# Patient Record
Sex: Female | Born: 1950
Health system: Southern US, Community
[De-identification: ages and names within clinical notes are randomized; demographics above are authoritative.]

## PROBLEM LIST (undated history)

## (undated) DIAGNOSIS — M1990 Unspecified osteoarthritis, unspecified site: Secondary | ICD-10-CM

## (undated) DIAGNOSIS — F329 Major depressive disorder, single episode, unspecified: Secondary | ICD-10-CM

## (undated) DIAGNOSIS — E785 Hyperlipidemia, unspecified: Secondary | ICD-10-CM

## (undated) DIAGNOSIS — F32A Anxiety disorder, unspecified: Secondary | ICD-10-CM

## (undated) DIAGNOSIS — I10 Essential (primary) hypertension: Secondary | ICD-10-CM

## (undated) DIAGNOSIS — F419 Anxiety disorder, unspecified: Secondary | ICD-10-CM

## (undated) DIAGNOSIS — K589 Irritable bowel syndrome without diarrhea: Secondary | ICD-10-CM

## (undated) DIAGNOSIS — R0789 Other chest pain: Secondary | ICD-10-CM

## (undated) DIAGNOSIS — Z86718 Personal history of other venous thrombosis and embolism: Secondary | ICD-10-CM

## (undated) HISTORY — PX: JOINT REPLACEMENT: SHX530

## (undated) HISTORY — PX: SHOULDER SURGERY: SHX246

## (undated) HISTORY — DX: Other chest pain: R07.89

## (undated) HISTORY — DX: Unspecified osteoarthritis, unspecified site: M19.90

## (undated) HISTORY — PX: CHOLECYSTECTOMY: SHX55

## (undated) HISTORY — DX: Essential (primary) hypertension: I10

## (undated) HISTORY — DX: Hyperlipidemia, unspecified: E78.5

## (undated) HISTORY — DX: Anxiety disorder, unspecified: F41.9

## (undated) HISTORY — DX: Major depressive disorder, single episode, unspecified: F32.9

## (undated) HISTORY — DX: Irritable bowel syndrome, unspecified: K58.9

## (undated) HISTORY — PX: ABDOMINAL HYSTERECTOMY: SHX81

## (undated) HISTORY — DX: Depression, unspecified: F32.A

## (undated) HISTORY — DX: Personal history of other venous thrombosis and embolism: Z86.718

---

## 1987-04-11 DIAGNOSIS — Z86718 Personal history of other venous thrombosis and embolism: Secondary | ICD-10-CM

## 1987-04-11 HISTORY — DX: Personal history of other venous thrombosis and embolism: Z86.718

## 2000-08-21 ENCOUNTER — Emergency Department (HOSPITAL_COMMUNITY): Admission: EM | Admit: 2000-08-21 | Discharge: 2000-08-22 | Payer: Self-pay | Admitting: Emergency Medicine

## 2000-09-20 ENCOUNTER — Ambulatory Visit (HOSPITAL_COMMUNITY): Admission: RE | Admit: 2000-09-20 | Discharge: 2000-09-20 | Payer: Self-pay | Admitting: Internal Medicine

## 2000-09-20 ENCOUNTER — Encounter: Payer: Self-pay | Admitting: Internal Medicine

## 2001-04-10 HISTORY — PX: TOTAL KNEE ARTHROPLASTY: SHX125

## 2001-05-13 ENCOUNTER — Emergency Department (HOSPITAL_COMMUNITY): Admission: EM | Admit: 2001-05-13 | Discharge: 2001-05-13 | Payer: Self-pay | Admitting: Emergency Medicine

## 2001-06-24 ENCOUNTER — Encounter (HOSPITAL_COMMUNITY): Admission: RE | Admit: 2001-06-24 | Discharge: 2001-07-24 | Payer: Self-pay | Admitting: Orthopedic Surgery

## 2001-07-25 ENCOUNTER — Encounter (HOSPITAL_COMMUNITY): Admission: RE | Admit: 2001-07-25 | Discharge: 2001-08-24 | Payer: Self-pay | Admitting: Orthopedic Surgery

## 2001-10-07 ENCOUNTER — Encounter (HOSPITAL_COMMUNITY)
Admission: RE | Admit: 2001-10-07 | Discharge: 2001-11-06 | Payer: Self-pay | Admitting: Physical Medicine and Rehabilitation

## 2001-10-25 ENCOUNTER — Ambulatory Visit (HOSPITAL_COMMUNITY): Admission: RE | Admit: 2001-10-25 | Discharge: 2001-10-25 | Payer: Self-pay | Admitting: Pulmonary Disease

## 2001-10-25 ENCOUNTER — Encounter: Payer: Self-pay | Admitting: Physical Medicine and Rehabilitation

## 2002-01-07 ENCOUNTER — Encounter: Payer: Self-pay | Admitting: Orthopedic Surgery

## 2002-01-13 ENCOUNTER — Inpatient Hospital Stay (HOSPITAL_COMMUNITY): Admission: RE | Admit: 2002-01-13 | Discharge: 2002-01-17 | Payer: Self-pay | Admitting: Orthopedic Surgery

## 2002-02-17 ENCOUNTER — Encounter (HOSPITAL_COMMUNITY): Admission: RE | Admit: 2002-02-17 | Discharge: 2002-03-19 | Payer: Self-pay | Admitting: Orthopedic Surgery

## 2002-03-19 ENCOUNTER — Encounter (HOSPITAL_COMMUNITY): Admission: RE | Admit: 2002-03-19 | Discharge: 2002-04-18 | Payer: Self-pay | Admitting: Orthopedic Surgery

## 2002-04-24 ENCOUNTER — Encounter (HOSPITAL_COMMUNITY): Admission: RE | Admit: 2002-04-24 | Discharge: 2002-05-24 | Payer: Self-pay | Admitting: Orthopedic Surgery

## 2002-06-02 ENCOUNTER — Encounter (HOSPITAL_COMMUNITY): Admission: RE | Admit: 2002-06-02 | Discharge: 2002-07-02 | Payer: Self-pay | Admitting: Orthopedic Surgery

## 2002-06-03 ENCOUNTER — Ambulatory Visit (HOSPITAL_COMMUNITY): Admission: RE | Admit: 2002-06-03 | Discharge: 2002-06-03 | Payer: Self-pay | Admitting: Pulmonary Disease

## 2003-01-26 ENCOUNTER — Encounter (HOSPITAL_COMMUNITY): Admission: RE | Admit: 2003-01-26 | Discharge: 2003-02-25 | Payer: Self-pay | Admitting: Orthopedic Surgery

## 2003-03-13 ENCOUNTER — Encounter (HOSPITAL_COMMUNITY): Admission: RE | Admit: 2003-03-13 | Discharge: 2003-04-12 | Payer: Self-pay | Admitting: Orthopedic Surgery

## 2003-03-31 ENCOUNTER — Ambulatory Visit (HOSPITAL_COMMUNITY): Admission: RE | Admit: 2003-03-31 | Discharge: 2003-03-31 | Payer: Self-pay | Admitting: Orthopedic Surgery

## 2003-04-11 HISTORY — PX: COLONOSCOPY: SHX174

## 2003-08-21 ENCOUNTER — Ambulatory Visit (HOSPITAL_COMMUNITY): Admission: RE | Admit: 2003-08-21 | Discharge: 2003-08-21 | Payer: Self-pay | Admitting: Internal Medicine

## 2003-11-04 ENCOUNTER — Ambulatory Visit (HOSPITAL_BASED_OUTPATIENT_CLINIC_OR_DEPARTMENT_OTHER): Admission: RE | Admit: 2003-11-04 | Discharge: 2003-11-04 | Payer: Self-pay | Admitting: Orthopedic Surgery

## 2003-11-04 ENCOUNTER — Ambulatory Visit (HOSPITAL_COMMUNITY): Admission: RE | Admit: 2003-11-04 | Discharge: 2003-11-04 | Payer: Self-pay | Admitting: Orthopedic Surgery

## 2003-11-18 ENCOUNTER — Encounter (HOSPITAL_COMMUNITY): Admission: RE | Admit: 2003-11-18 | Discharge: 2003-12-18 | Payer: Self-pay | Admitting: Orthopedic Surgery

## 2003-12-21 ENCOUNTER — Encounter (HOSPITAL_COMMUNITY): Admission: RE | Admit: 2003-12-21 | Discharge: 2004-01-08 | Payer: Self-pay | Admitting: Orthopedic Surgery

## 2004-01-13 ENCOUNTER — Encounter (HOSPITAL_COMMUNITY): Admission: RE | Admit: 2004-01-13 | Discharge: 2004-02-12 | Payer: Self-pay | Admitting: Orthopedic Surgery

## 2004-06-06 ENCOUNTER — Ambulatory Visit (HOSPITAL_COMMUNITY): Admission: RE | Admit: 2004-06-06 | Discharge: 2004-06-06 | Payer: Self-pay

## 2004-06-13 ENCOUNTER — Emergency Department (HOSPITAL_COMMUNITY): Admission: EM | Admit: 2004-06-13 | Discharge: 2004-06-13 | Payer: Self-pay | Admitting: Emergency Medicine

## 2005-04-10 HISTORY — PX: TOTAL KNEE ARTHROPLASTY: SHX125

## 2005-06-12 ENCOUNTER — Ambulatory Visit (HOSPITAL_COMMUNITY): Admission: RE | Admit: 2005-06-12 | Discharge: 2005-06-12 | Payer: Self-pay | Admitting: Pulmonary Disease

## 2005-08-16 ENCOUNTER — Ambulatory Visit: Payer: Self-pay | Admitting: Internal Medicine

## 2005-11-20 ENCOUNTER — Inpatient Hospital Stay (HOSPITAL_COMMUNITY): Admission: RE | Admit: 2005-11-20 | Discharge: 2005-11-23 | Payer: Self-pay | Admitting: Orthopedic Surgery

## 2005-11-22 ENCOUNTER — Encounter (INDEPENDENT_AMBULATORY_CARE_PROVIDER_SITE_OTHER): Payer: Self-pay | Admitting: *Deleted

## 2005-12-18 ENCOUNTER — Encounter (HOSPITAL_COMMUNITY): Admission: RE | Admit: 2005-12-18 | Discharge: 2006-01-05 | Payer: Self-pay | Admitting: Orthopedic Surgery

## 2006-01-08 ENCOUNTER — Encounter (HOSPITAL_COMMUNITY): Admission: RE | Admit: 2006-01-08 | Discharge: 2006-02-07 | Payer: Self-pay | Admitting: Orthopedic Surgery

## 2006-02-13 ENCOUNTER — Encounter (HOSPITAL_COMMUNITY): Admission: RE | Admit: 2006-02-13 | Discharge: 2006-03-15 | Payer: Self-pay | Admitting: Orthopedic Surgery

## 2006-03-16 ENCOUNTER — Encounter (HOSPITAL_COMMUNITY): Admission: RE | Admit: 2006-03-16 | Discharge: 2006-04-09 | Payer: Self-pay | Admitting: Orthopedic Surgery

## 2006-05-13 ENCOUNTER — Emergency Department (HOSPITAL_COMMUNITY): Admission: EM | Admit: 2006-05-13 | Discharge: 2006-05-13 | Payer: Self-pay | Admitting: Emergency Medicine

## 2006-06-22 ENCOUNTER — Ambulatory Visit (HOSPITAL_COMMUNITY): Admission: RE | Admit: 2006-06-22 | Discharge: 2006-06-22 | Payer: Self-pay | Admitting: Pulmonary Disease

## 2006-07-30 ENCOUNTER — Ambulatory Visit (HOSPITAL_COMMUNITY): Admission: RE | Admit: 2006-07-30 | Discharge: 2006-07-30 | Payer: Self-pay | Admitting: Pulmonary Disease

## 2007-06-03 ENCOUNTER — Ambulatory Visit (HOSPITAL_COMMUNITY): Admission: RE | Admit: 2007-06-03 | Discharge: 2007-06-03 | Payer: Self-pay | Admitting: Pulmonary Disease

## 2007-06-21 ENCOUNTER — Ambulatory Visit (HOSPITAL_COMMUNITY): Admission: RE | Admit: 2007-06-21 | Discharge: 2007-06-21 | Payer: Self-pay | Admitting: Pulmonary Disease

## 2007-07-04 ENCOUNTER — Encounter: Payer: Self-pay | Admitting: Orthopedic Surgery

## 2007-12-03 ENCOUNTER — Ambulatory Visit (HOSPITAL_COMMUNITY): Admission: RE | Admit: 2007-12-03 | Discharge: 2007-12-03 | Payer: Self-pay | Admitting: Pulmonary Disease

## 2009-01-22 ENCOUNTER — Emergency Department (HOSPITAL_COMMUNITY): Admission: EM | Admit: 2009-01-22 | Discharge: 2009-01-22 | Payer: Self-pay | Admitting: Emergency Medicine

## 2009-07-25 ENCOUNTER — Emergency Department (HOSPITAL_COMMUNITY): Admission: EM | Admit: 2009-07-25 | Discharge: 2009-07-25 | Payer: Self-pay | Admitting: Emergency Medicine

## 2009-09-13 ENCOUNTER — Encounter (INDEPENDENT_AMBULATORY_CARE_PROVIDER_SITE_OTHER): Payer: Self-pay | Admitting: *Deleted

## 2009-09-13 LAB — CONVERTED CEMR LAB
Alkaline Phosphatase: 100 units/L
Bilirubin, Direct: 0.1 mg/dL
CO2: 28 meq/L
Cholesterol: 126 mg/dL
Creatinine, Ser: 0.53 mg/dL
Glucose, Bld: 102 mg/dL
Hgb A1c MFr Bld: 7 %
LDL Cholesterol: 58 mg/dL
Triglycerides: 67 mg/dL

## 2009-10-01 ENCOUNTER — Encounter (INDEPENDENT_AMBULATORY_CARE_PROVIDER_SITE_OTHER): Payer: Self-pay | Admitting: *Deleted

## 2009-10-05 ENCOUNTER — Ambulatory Visit: Payer: Self-pay | Admitting: Cardiology

## 2009-10-05 ENCOUNTER — Encounter (INDEPENDENT_AMBULATORY_CARE_PROVIDER_SITE_OTHER): Payer: Self-pay | Admitting: *Deleted

## 2009-10-05 DIAGNOSIS — K589 Irritable bowel syndrome without diarrhea: Secondary | ICD-10-CM | POA: Insufficient documentation

## 2009-10-05 DIAGNOSIS — E782 Mixed hyperlipidemia: Secondary | ICD-10-CM

## 2009-10-05 DIAGNOSIS — I82409 Acute embolism and thrombosis of unspecified deep veins of unspecified lower extremity: Secondary | ICD-10-CM | POA: Insufficient documentation

## 2009-10-05 DIAGNOSIS — M1990 Unspecified osteoarthritis, unspecified site: Secondary | ICD-10-CM | POA: Insufficient documentation

## 2009-10-05 DIAGNOSIS — F329 Major depressive disorder, single episode, unspecified: Secondary | ICD-10-CM

## 2009-10-05 DIAGNOSIS — E119 Type 2 diabetes mellitus without complications: Secondary | ICD-10-CM | POA: Insufficient documentation

## 2009-10-05 DIAGNOSIS — F411 Generalized anxiety disorder: Secondary | ICD-10-CM | POA: Insufficient documentation

## 2009-10-05 DIAGNOSIS — F339 Major depressive disorder, recurrent, unspecified: Secondary | ICD-10-CM | POA: Insufficient documentation

## 2009-10-05 DIAGNOSIS — R609 Edema, unspecified: Secondary | ICD-10-CM | POA: Insufficient documentation

## 2009-10-05 DIAGNOSIS — R0789 Other chest pain: Secondary | ICD-10-CM

## 2009-10-05 HISTORY — DX: Acute embolism and thrombosis of unspecified deep veins of unspecified lower extremity: I82.409

## 2009-10-06 ENCOUNTER — Encounter: Payer: Self-pay | Admitting: Cardiology

## 2009-10-29 ENCOUNTER — Encounter: Payer: Self-pay | Admitting: Internal Medicine

## 2009-11-26 ENCOUNTER — Encounter: Payer: Self-pay | Admitting: Cardiology

## 2009-11-26 ENCOUNTER — Ambulatory Visit: Payer: Self-pay | Admitting: Cardiology

## 2009-11-26 ENCOUNTER — Ambulatory Visit (HOSPITAL_COMMUNITY): Admission: RE | Admit: 2009-11-26 | Discharge: 2009-11-26 | Payer: Self-pay | Admitting: Cardiology

## 2009-12-03 ENCOUNTER — Ambulatory Visit (HOSPITAL_COMMUNITY): Admission: RE | Admit: 2009-12-03 | Discharge: 2009-12-03 | Payer: Self-pay | Admitting: Adult Health

## 2009-12-03 ENCOUNTER — Encounter (INDEPENDENT_AMBULATORY_CARE_PROVIDER_SITE_OTHER): Payer: Self-pay | Admitting: *Deleted

## 2009-12-03 ENCOUNTER — Ambulatory Visit: Payer: Self-pay | Admitting: Cardiology

## 2009-12-03 ENCOUNTER — Encounter: Payer: Self-pay | Admitting: Adult Health

## 2009-12-03 DIAGNOSIS — R9439 Abnormal result of other cardiovascular function study: Secondary | ICD-10-CM | POA: Insufficient documentation

## 2009-12-03 LAB — CONVERTED CEMR LAB
BUN: 14 mg/dL (ref 6–23)
CO2: 26 meq/L (ref 19–32)
Chloride: 104 meq/L (ref 96–112)
Eosinophils Absolute: 0.1 10*3/uL (ref 0.0–0.7)
Eosinophils Relative: 1 % (ref 0–5)
Glucose, Bld: 164 mg/dL — ABNORMAL HIGH (ref 70–99)
HCT: 33.9 % — ABNORMAL LOW (ref 36.0–46.0)
INR: 0.87 (ref <1.50)
Lymphs Abs: 2.5 10*3/uL (ref 0.7–4.0)
MCV: 89.8 fL (ref 78.0–100.0)
Monocytes Absolute: 0.3 10*3/uL (ref 0.1–1.0)
Monocytes Relative: 5 % (ref 3–12)
Potassium: 3.9 meq/L (ref 3.5–5.3)
RBC: 3.77 M/uL — ABNORMAL LOW (ref 3.87–5.11)
WBC: 7.3 10*3/uL (ref 4.0–10.5)

## 2009-12-06 ENCOUNTER — Ambulatory Visit: Payer: Self-pay | Admitting: Internal Medicine

## 2009-12-06 ENCOUNTER — Inpatient Hospital Stay (HOSPITAL_BASED_OUTPATIENT_CLINIC_OR_DEPARTMENT_OTHER): Admission: RE | Admit: 2009-12-06 | Discharge: 2009-12-06 | Payer: Self-pay | Admitting: Internal Medicine

## 2009-12-10 ENCOUNTER — Encounter (INDEPENDENT_AMBULATORY_CARE_PROVIDER_SITE_OTHER): Payer: Self-pay | Admitting: *Deleted

## 2009-12-17 ENCOUNTER — Encounter (INDEPENDENT_AMBULATORY_CARE_PROVIDER_SITE_OTHER): Payer: Self-pay | Admitting: *Deleted

## 2009-12-30 ENCOUNTER — Encounter (INDEPENDENT_AMBULATORY_CARE_PROVIDER_SITE_OTHER): Payer: Self-pay | Admitting: *Deleted

## 2010-01-24 ENCOUNTER — Emergency Department (HOSPITAL_COMMUNITY): Admission: EM | Admit: 2010-01-24 | Discharge: 2010-01-24 | Payer: Self-pay | Admitting: Emergency Medicine

## 2010-04-30 ENCOUNTER — Encounter: Payer: Self-pay | Admitting: Pulmonary Disease

## 2010-05-01 ENCOUNTER — Encounter: Payer: Self-pay | Admitting: Pulmonary Disease

## 2010-05-12 NOTE — Letter (Signed)
Summary: Stress Echocardiogram Information Sheet  [facility] at [facility]  [address], [place] [address]   Phone: [phone]  Fax: [phone]      October 05, 2009 MRN: [id number] light prior to the test.   Donalynn Furlong  Doctor: Appointment Date: Appointment Time: Appointment Location: [facility]  Stress Echocardiogram Information Sheet    Instructions:   1. DO NOT  take your AM  medicine the morning before the test.  2. Nothing to eat or drink prior to the tests  3. Dress prepared to exercise.  4. DO NOT use ANY caffine or tobacco products 3 hours before appointment.  5. Report to the Short Stay Center on the1st floor.  6. Please bring all current prescription medications.  7. If you have any questions, please call [phone]

## 2010-05-12 NOTE — Letter (Signed)
Summary: [place] [facility], [facility] at [facility]  [address], [place] [address]   Phone: [phone]  Fax: [phone]      December 10, 2009 MRN: [id number]   [name] [address] [place], [place]  [address]   Dear [name],  Your test ordered by Selena Batten has been reviewed by your physician (or physician assistant) and was found to be normal or stable. Your physician (or physician assistant) felt no changes were needed at this time.  __x__ Echocardiogram  ____ Cardiac Stress Test  __x__ Lab Work  ____ Peripheral vascular study of arms, legs or neck  __x__ CT scan or X-ray  ____ Lung or Breathing test  ____ Other:  No change in medical treatment at this time, per Dr. Dietrich Pates.  Please continue all current medicaitons as prescribed.  Thank you, Tammy Allyne Gee RN    [place] Bing, MD, Lenise Arena.C.Gaylord Shih, MD, F.A.C.C Lewayne Bunting, MD, F.A.C.C Nona Dell, MD, F.A.C.C Charlton Haws, MD, Lenise Arena.C.C

## 2010-05-12 NOTE — Letter (Signed)
Summary: Cardiac Catheterization Instructions- [facility]  [facility] [place]  [address] [place] [address]   Phone: [phone]  Fax: [phone]     12/03/2009 MRN: [id number]  Melissa Tucker [address] [place], [place]  [address]  Dear Melissa Tucker,   You are scheduled for a Cardiac Catheterization on 12/06/2009 with Dr.Bensimhon  Please arrive to the 1st floor of the Heart and Vascular Center at [facility] at 6:30 am  on the day of your procedure. Please do not arrive before 6:30 a.m. Call the Heart and Vascular Center at [phone] if you are unable to make your appointmnet. The Code to get into the parking garage under the building is 0020. Take the elevators to the 1st floor. You must have someone to drive you home. Someone must be with you for the first 24 hours after you arrive home. Please wear clothes that are easy to get on and off and wear slip-on shoes. Do not eat or drink after midnight except water with your medications that morning. Bring all your medications and current insurance cards with you.  _x__ DO NOT take these medications before your procedure: _glucophage/metformin the evening before the cath and 48 hrs after the cath, do not take furosemide the morning of cath  __x_ Make sure you take your aspirin.  ___ You may take ALL of your medications with water that morning. ________________________________________________________________________________________________________________________________  ___ DO NOT take ANY medications before your procedure.  ___ Pre-med instructions:  ________________________________________________________________________________________________________________________________  The usual length of stay after your procedure is 2 to 3 hours. This can vary.  If you have any questions, please call the office at the number listed above.   Teressa Lower RN

## 2010-05-12 NOTE — Assessment & Plan Note (Signed)
Summary: **PER MelissaHAWKIINS FOR CHEST TIGHTNESS/TG   Visit Type:  Initial Consult Primary Provider:  Dr. Kari Tucker   History of Present Illness: [name] is seen for an initial visit at the kind request of Melissa Tucker for evaluation of chest discomfort with a history of multiple cardiovascular risk factors.  This nice woman has not previously been seen by a cardiologist nor has she undergone any significant cardiac testing.    Prior hospital records were obtained and reviewed.  During an admission for total knee arthroplasty in 2007, her orthopaedic surgeon referred to chest discomfort that had been present for a number of years.  Accordingly, this is a long-standing complaint.  She developed chest pressure a few weeks ago in the setting of increased anxiety.  This was of mild to moderate severity and not associated with activity.  There was no radiation.  There were no associated symptoms.  She does have chronic class II dyspnea on exertion.  Chest discomfort is now resolved, and patient indicates that she is more concerned about her pedal edema.  Current Medications (verified): 1)  Glyburide-Metformin 5-500 Mg Tabs (Glyburide-Metformin) .... Take 2 Tabs Two Times A Day 2)  Lisinopril 40 Mg Tabs (Lisinopril) .... Take One Tablet By Mouth Daily 3)  Sertraline Hcl 50 Mg Tabs (Sertraline Hcl) .... Take 1 Tab Daily 4)  Simvastatin 40 Mg Tabs (Simvastatin) .... Take 1 Tab Daily 5)  Dicyclomine Hcl 10 Mg Caps (Dicyclomine Hcl) .... Take 1 Tab Daily 6)  Aspirin 325 Mg Tabs (Aspirin) .... Take 1 Tab Daily 7)  Furosemide 40 Mg Tabs (Furosemide) .... Take 1 Tab Daily 8)  Alprazolam 0.5 Mg Tabs (Alprazolam) .... Take 1 Tab Three Times A Day 9)  Hydrocodone-Acetaminophen 10-660 Mg Tabs (Hydrocodone-Acetaminophen) .... Take As Needed  Allergies (verified): No Known Drug Allergies  Past History:  Past Medical History: Last updated: Oct 12, 2009 Chest  tightness Hypertension Hyperlipidemia AODM-no insulin; 1994 onset Anxiety/depression Degenerative joint disease-status post bilateral TKA Irritable bowel syndrome History of deep vein thrombosis-1989 following childbirth  Past Surgical History: Last updated: October 12, 2009 Colonoscopy-2005; normal findings Left shoulder manipulation with subacrominal decompression -capsulitis Left TKA-2003; Right TKA-2007 Hysterectomy Cholecystectomy C-Section-1988  Family History: Last updated: October 12, 2009 Father:deceased 80 yrs esophageal cancer Mother:alive at age 59 with heart disease history of cervical cancer and hypertension. Siblings:5 brothers 2 sisters; one sister with hypertension   Social History: Last updated: Oct 12, 2009 Employment-disabled Marital-divorced; resides with her daughter; also has 2 sons Tobacco Use - minimal prior use; discontinued 1990  Alcohol Use - no Regular Exercise - no Drug Use - no patient lives with daughter  Review of Systems       Patient requires corrective lenses for near vision; occasional mild palpitations; intermittent diarrhea and constipation; urinary frequency; diffuse arthritis; recently noted lower extremity edema, more prominent on the right.  She reports a history of sinusitis.  Vital Signs:  Patient profile:   60 year old female Height:      69 inches Weight:      239 pounds BMI:     35.42 Pulse rate:   87 / minute BP sitting:   145 / 75  (right arm)  Vitals Entered By: Dreama Saa, CNA (October 12, 2009 1:07 PM)  Physical Exam  General:  Overweight; well-developed; no acute distress: HEENT-[place]/AT; PERRL; EOM intact; conjunctiva and lids nl:  Neck-No JVD; no carotid bruits: Endocrine-No thyromegaly: Lungs-No tachypnea, clear without rales, rhonchi or wheezes: CV-normal PMI; normal S1 and S2:;  Abdomen-BS normal;  soft and non-tender without masses or organomegaly: MS-No deformities, cyanosis or clubbing; surgical scars over both  knees Neurologic-Nl cranial nerves; symmetric strength and tone: Skin- Warm, no sig. lesions: Extremities-Nl distal pulses; no edema     Impression & Recommendations:  Problem # 1:  CHEST DISCOMFORT (ICD-786.59) Chest discomfort is very atypical, has resolved spontaneously and has been present for a number of years.  I doubt that this represents myocardial ischemia, but due to her numerous cardiovascular risk factors, stress testing is warranted.  A stress echocardiogram will be performed.  Problem # 2:  HYPERLIPIDEMIA (ICD-272.4)  Recent lipid profile was excellent; current medication will be continued.  Problem # 3:  HYPERTENSION (ICD-401.1) Blood pressure control somewhat suboptimal.  Dose of lisinopril will be increased to 40 mg q.d.  Problem # 4:  EDEMA (ICD-782.3) Edema is relatively modest, but is troubling the patient.  Actos may be contributing and will be discontinued.  If diabetic control becomes compromised, an alternative agent such as Januvia can be selected.  If edema does not resolve, further investigation and treatment can be considered.  I doubt this is a manifestation of any cardiac problems.  I will reassess this nice woman in one month after stress testing has been completed.  Other Orders: Stress Echo (Stress Echo)  EKG  Procedure date:  10/05/2009  Findings:      Normal sinus rhythm Left axis deviation LVH with QRS widening Delayed R wave progression-cannot exclude prior anteroseptal MI Nonspecific ST-T wave abnormality IVCD Since previous tracing of 09/13/09, axis has shifted leftward.   Patient Instructions: 1)  Your physician recommends that you schedule a follow-up appointment in: 1 month 2)  Your physician has recommended you make the following change in your medication: stop actos, increase lisinopril to 40mg  daily 3)  Your physician has requested that you have a stress echocardiogram. For further information please visit [email or web address]   Please follow instruction sheet as given. Prescriptions: LISINOPRIL 40 MG TABS (LISINOPRIL) Take one tablet by mouth daily  #30 x 6   Entered by:   Teressa Lower RN   Authorized by:   Kathlen Brunswick, MD, [facility]   Signed by:   Teressa Lower RN on 10/05/2009   Method used:   Electronically to        [facility] (retail)       [address]       [place], [place]  [address]       Ph: [phone] or [phone]       Fax: [phone]   RxID:   [id number]

## 2010-05-12 NOTE — Assessment & Plan Note (Signed)
Summary: ROV   Visit Type:  Follow-up Primary Provider:  Dr. Kari Baars  CC:  no cardiology complaints.  History of Present Illness: Melissa Tucker is a pleasant  60 y/o AAF we are seeing on follow-up after having stress echocardiogram in the setting of multiple CVRF of hypertension, hyperlipidemia, Diabetes, and chest pain.  She was seen by Dr Dietrich Pates on 10/05/2009. She has had occaisional chest discomfort since last visit and complains of stress in her personal life.  Current Medications (verified): 1)  Glyburide-Metformin 5-500 Mg Tabs (Glyburide-Metformin) .... Take 2 Tabs Two Times A Day 2)  Lisinopril 40 Mg Tabs (Lisinopril) .... Take One Tablet By Mouth Daily 3)  Sertraline Hcl 50 Mg Tabs (Sertraline Hcl) .... Take 1 Tab Daily 4)  Simvastatin 40 Mg Tabs (Simvastatin) .... Take 1 Tab Daily 5)  Dicyclomine Hcl 10 Mg Caps (Dicyclomine Hcl) .... Take 1 Tab Daily 6)  Aspirin 325 Mg Tabs (Aspirin) .... Take 1 Tab Daily 7)  Furosemide 40 Mg Tabs (Furosemide) .... Take 1 Tab Daily 8)  Alprazolam 0.5 Mg Tabs (Alprazolam) .... Take 1 Tab Three Times A Day 9)  Hydrocodone-Acetaminophen 10-660 Mg Tabs (Hydrocodone-Acetaminophen) .... Take As Needed  Allergies (verified): No Known Drug Allergies  Past History:  Past medical, surgical, family and social histories (including risk factors) reviewed, and no changes noted (except as noted below).  Past Medical History: Reviewed history from 10/05/2009 and no changes required. Chest tightness Hypertension Hyperlipidemia AODM-no insulin; 1994 onset Anxiety/depression Degenerative joint disease-status post bilateral TKA Irritable bowel syndrome History of deep vein thrombosis-1989 following childbirth  Past Surgical History: Reviewed history from 10/05/2009 and no changes required. Colonoscopy-2005; normal findings Left shoulder manipulation with subacrominal decompression -capsulitis Left TKA-2003; Right  TKA-2007 Hysterectomy Cholecystectomy C-Section-1988  Family History: Reviewed history from 10/05/2009 and no changes required. Father:deceased 80 yrs esophageal cancer Mother:alive at age 45 with heart disease history of cervical cancer and hypertension. Siblings:5 brothers 2 sisters; one sister with hypertension   Social History: Reviewed history from 10/05/2009 and no changes required. Employment-disabled Marital-divorced; resides with her daughter; also has 2 sons Tobacco Use - minimal prior use; discontinued 1990  Alcohol Use - no Regular Exercise - no Drug Use - no patient lives with daughter  Review of Systems       All other systems have been reviewed and are negative unless stated above.   Vital Signs:  Patient profile:   60 year old female Weight:      241 pounds Pulse rate:   93 / minute BP sitting:   139 / 72  (right arm)  Vitals Entered By: Dreama Saa, CNA (December 03, 2009 1:25 PM)  Physical Exam  General:  Well developed, well nourished, in no acute distress. Head:  normocephalic and atraumatic Eyes:  PERRLA/EOM intact; conjunctiva and lids normal. Mouth:  Teeth, gums and palate normal. Oral mucosa normal. Lungs:  Clear bilaterally to auscultation and percussion. Heart:  Non-displaced PMI, chest non-tender; regular rate and rhythm, S1, S2 without murmurs, rubs or gallops. Carotid upstroke normal, no bruit. Normal abdominal aortic size, no bruits. Femorals normal pulses, no bruits. Pedals normal pulses. No edema, no varicosities. Abdomen:  Bowel sounds positive; abdomen soft and non-tender without masses, organomegaly, or hernias noted. No hepatosplenomegaly. Msk:  Back normal, normal gait. Muscle strength and tone normal. Pulses:  pulses normal in all 4 extremities Extremities:  No clubbing or cyanosis. Neurologic:  Alert and oriented x 3. Psych:  Normal affect.   Impression &  Recommendations:  Problem # 1:  ABNORMAL CV (STRESS) TEST  (ICD-794.39) Echo dated 12/01/2009 demonstrated hypokinesis of the antereoseptal and distal inferolateral LV myocardium.  Other segments showed slightly augumented to unchanged contractility.  LV size was normal and incrased from baseline.  LVEF was 60%.  We have therefore planned her for cardiac catherization early next week, December 06, 2009.  Risks, benefits, and written instructions are provided for the patient. She verbalized understanding is willing to proceed.  More recomemendations per cathing physician.  Problem # 2:  HYPERTENSION (ICD-401.1) Assessment: Unchanged  Her updated medication list for this problem includes:    Lisinopril 40 Mg Tabs (Lisinopril) .Marland Kitchen... Take one tablet by mouth daily    Aspirin 325 Mg Tabs (Aspirin) .Marland Kitchen... Take 1 tab daily    Furosemide 40 Mg Tabs (Furosemide) .Marland Kitchen... Take 1 tab daily  Orders: T-Basic Metabolic Panel [id number])  Problem # 3:  HYPERLIPIDEMIA (ICD-272.4) Assessment: Unchanged  Her updated medication list for this problem includes:    Simvastatin 40 Mg Tabs (Simvastatin) .Marland Kitchen... Take 1 tab daily  Other Orders: T-Chest x-ray, 2 views ([address]) T-CBC w/Diff ([id number]) T-Protime, Auto ([id number]) T-PTT ([id number]) Cardiac Catheterization (Cardiac Cath)  Patient Instructions: 1)  Your physician recommends that you schedule a follow-up appointment in: after procedure 2)  Your physician recommends that you return for lab work [id number] 3)  A chest x-ray takes a picture of the organs and structures inside the chest, including the heart, lungs, and blood vessels. This test can show several things, including, whether the heart is enlarged; whether fluid is building up in the lungs; and whether pacemaker / defibrillator leads are still in place. 4)  Your physician has requested that you have a cardiac catheterization.  Cardiac catheterization is used to diagnose and/or treat various heart conditions. Doctors may recommend this procedure for a  number of different reasons. The most common reason is to evaluate chest pain. Chest pain can be a symptom of coronary artery disease (CAD), and cardiac catheterization can show whether plaque is narrowing or blocking your heart's arteries. This procedure is also used to evaluate the valves, as well as measure the blood flow and oxygen levels in different parts of your heart.  For further information please visit [email or web address]  Please follow instruction sheet, as given.

## 2010-05-12 NOTE — Letter (Signed)
Summary: Internal Other/ REMINDER/TCS NOT DUE TIL 08/2013  Internal Other/ REMINDER/TCS NOT DUE TIL 08/2013   Imported By: Cloria Spring LPN 11/91/4782 95:62:13  _____________________________________________________________________  External Attachment:    Type:   Image     Comment:   External Document

## 2010-05-12 NOTE — Letter (Signed)
Summary: Appointment - Missed  [facility]     [place], [place]    Phone:   Fax:      December 30, 2009 MRN: [id number]   VENICIA VANDALL [address] [place], [place]  [address]   Dear Ms. Yetta Barre,  Our records indicate you missed your appointment on       12/30/09 Joni Reining NP                  It is very important that we reach you to reschedule this appointment. We look forward to participating in your health care needs. Please contact us at the number listed above at your earliest convenience to reschedule this appointment.     Sincerely,    Glass blower/designer

## 2010-05-12 NOTE — Letter (Signed)
Summary: Appointment - Missed  [facility]     [place], [place]    Phone:   Fax:      December 17, 2009 MRN: [id number]   Melissa Tucker [address] [place], [place]  [address]   Dear Melissa Tucker,  Our records indicate you missed your appointment on             12/17/09 Bailey Mech NP          It is very important that we reach you to reschedule this appointment. We look forward to participating in your health care needs. Please contact us at the number listed above at your earliest convenience to reschedule this appointment.     Sincerely,    Glass blower/designer

## 2010-05-12 NOTE — Miscellaneous (Signed)
Summary: LABS BMP,LIPIDS,LIVER,09/13/2009  Clinical Lists Changes  Observations: Added new observation of CALCIUM: 10.1 mg/dL (16/01/9603 54:09) Added new observation of ALBUMIN: 4.7 g/dL (81/19/1478 29:56) Added new observation of PROTEIN, TOT: 7.2 g/dL (21/30/8657 84:69) Added new observation of SGPT (ALT): 18 units/L (09/13/2009 10:45) Added new observation of SGOT (AST): 15 units/L (09/13/2009 10:45) Added new observation of ALK PHOS: 100 units/L (09/13/2009 10:45) Added new observation of BILI DIRECT: 0.1 mg/dL (62/95/2841 32:44) Added new observation of CREATININE: 0.53 mg/dL (04/12/7251 66:44) Added new observation of BUN: 17 mg/dL (03/47/4259 56:38) Added new observation of BG RANDOM: 102 mg/dL (75/64/3329 51:88) Added new observation of CO2 PLSM/SER: 28 meq/L (09/13/2009 10:45) Added new observation of CL SERUM: 103 meq/L (09/13/2009 10:45) Added new observation of K SERUM: 4.0 meq/L (09/13/2009 10:45) Added new observation of NA: 141 meq/L (09/13/2009 10:45) Added new observation of LDL: 58 mg/dL (41/66/0630 16:01) Added new observation of HDL: 55 mg/dL (09/32/3557 32:20) Added new observation of TRIGLYC TOT: 67 mg/dL (25/42/7062 37:62) Added new observation of CHOLESTEROL: 126 mg/dL (83/15/1761 60:73) Added new observation of HGBA1C: 7.0 % (09/13/2009 10:45)

## 2010-06-01 ENCOUNTER — Ambulatory Visit (HOSPITAL_COMMUNITY)
Admission: RE | Admit: 2010-06-01 | Discharge: 2010-06-01 | Disposition: A | Discharge status: Home / Self Care | Payer: Medicare Other | Source: Ambulatory Visit | Attending: Pulmonary Disease | Admitting: Pulmonary Disease

## 2010-06-01 ENCOUNTER — Other Ambulatory Visit (HOSPITAL_COMMUNITY): Payer: Self-pay | Admitting: Pulmonary Disease

## 2010-06-01 DIAGNOSIS — M545 Low back pain, unspecified: Secondary | ICD-10-CM | POA: Insufficient documentation

## 2010-06-01 DIAGNOSIS — M4120 Other idiopathic scoliosis, site unspecified: Secondary | ICD-10-CM | POA: Insufficient documentation

## 2010-06-23 LAB — POCT I-STAT GLUCOSE: Glucose, Bld: 228 mg/dL — ABNORMAL HIGH (ref 70–99)

## 2010-08-26 NOTE — Op Note (Signed)
NAME:  Melissa Tucker, Melissa Tucker                           ACCOUNT NO.:  [id number]   MEDICAL RECORD NO.:  [id number]                   PATIENT TYPE:  AMB   LOCATION:  DAY                                  FACILITY:  [facility]   PHYSICIAN:  Lionel December, M.D.                 DATE OF BIRTH:  [birth year]   DATE OF PROCEDURE:  DATE OF DISCHARGE:                                 OPERATIVE REPORT   PROCEDURE:  Total colonoscopy.   ENDOSCOPIST:  Lionel December, M.D.   INDICATIONS:  Melissa Tucker is a 45 African-American female who is undergoing  screening colonoscopy.  Family history is negative for colorectal carcinoma.  She gives a history of intermittent postprandial diarrhea felt to be due to  IBS.  The procedure and risks were reviewed with the patient and informed  consent was obtained.   PREOPERATIVE MEDICATIONS:  Demerol 50 mg IV and Versed 8 mg IV in divided  dose.   FINDINGS:  Procedure performed in endoscopy suite.  The patient's vital  signs and O2 saturation were monitored during the procedure and remained  stable.  The patient was placed in the left lateral recumbent position and  rectal examination was performed.  No abnormality noted on external or  digital exam.   Olympus videoscope was placed in the rectum and advanced under vision into  the sigmoid colon and beyond.  Preparation was satisfactory.  The scope was  passed to the cecum which was identified by appendiceal orifice and the  ileocecal valve.  Pictures were taken for the record. As the scope was  withdrawn the colonic mucosa was, once again, carefully examined and was  normal throughout.  Rectal mucosa similarly was normal.   The scope was retroflexed to examine the anorectal junction which was  unremarkable.  The endoscope was straightened and withdrawn.  The patient  tolerated the procedure well.   FINAL DIAGNOSIS:  Normal colonoscopy.   RECOMMENDATIONS:  1. High fiber diet.  2. Levbid 1/2 to 1 tablet q.a.m.  Prescription given  for 30 doses with 5     refills.  3. She should continue yearly Hemoccults and consider next screening exam in     10 years from now.      ___________________________________________                                            Lionel December, M.D.   NR/MEDQ  D:  08/21/2003  T:  08/21/2003  Job:  [id number]   cc:   Ramon Dredge L. Juanetta Gosling, M.D.  [address]  [place]  [place] [address]  Fax: [phone]

## 2010-08-26 NOTE — Op Note (Signed)
NAMEHAYLEA, Melissa Tucker                 ACCOUNT NO.:  [id number]   MEDICAL RECORD NO.:  [id number]          PATIENT TYPE:  INP   LOCATION:  5039                         FACILITY:  [facility]   PHYSICIAN:  Mila Homer. Sherlean Foot, M.D. DATE OF BIRTH:  April 14, 1950   DATE OF PROCEDURE:  11/20/2005  DATE OF DISCHARGE:                                 OPERATIVE REPORT   SURGEON:  Georgena Spurling, MD   ASSISTANT:  Oris Drone. Petrarca, P.A.-C.   ANESTHESIA:  General.   PREOPERATIVE DIAGNOSIS:  Right knee osteoarthritis.   POSTOPERATIVE DIAGNOSIS:  Right knee osteoarthritis.   PROCEDURE:  Right total knee arthroplasty.   INDICATIONS FOR PROCEDURE:  The patient is a 60 year old black female with  failure of conservative measures, for osteoarthritis of the knee.  Informed  consent was obtained.   DESCRIPTION OF PROCEDURE:  The patient was laid supine and administered  general anesthesia and Foley catheter placement.  Right leg was prepped and  draped in the usual sterile fashion.  After a sterile prep and drape, the  extremity was exsanguinated with the Esmarch and tourniquet inflated to 350  mmHg.  Then made a midline incision approximately 6 inches long with a #10  blade.  I used a fresh blade to make a median parapatellar arthrotomy and  perform a synovectomy.  I reflected the deep MCL off the proximal 2 cm of  the medial crest of the tibia, but not going far, since this was a valgus  knee.  I then everted the patella measuring at 23 mm and reamed down to 14.5  mm.  I drilled 3 log holes through the 32-mm template, and with the 32-mm  trial prosthesis in place, we recreated the 23-mm thickness.  I then removed  the prosthetic trial and went into flexion.  I cut the ACL and PCL and then  used the extramedullary alignment system to make a perpendicular cut to the  anatomic axis of the tibia, removing 2 mm of bone off the lateral tibial  plateau, which was the low side.  I then removed the cut surface of the  bone  and extramedullary device.  I then made the intramedullary drill hole in the  femur, placed the intramedullary guide set on 4-degree valgus cut.  I pinned  the distal femoral cutting block into place and made the distal femoral cut  with a sagittal saw.  I then marked out the epicondylar axis.  The posterior  condylar angle measured 3 degrees.  I sized to a size F, pinned through the  3-degree external rotation holes.  I then put the 4-in-1 cutting block into  place, and made the anterior and posterior chamfer cuts with sagittal saw.  I then removed the cutting guide.  I then placed the laminar spreader in the  knee, removed the ACL, PCL, medial and lateral menisci, and the posterior  condylar osteophytes.  I then placed a 10-mm spacer block in the knee, had  tightness in the lateral compartment.  I then put a laminar spreader in the  knee in extension and released with  the pie crusting technique on the  lateral side until I achieved equal gaps on both sides with the laminar  spreaders in the knee.  I then finished the femur with a size F finishing  block, finished the tibia with a size 4 tibial tray, and then trialed with a  size F femur size, 4 tibia size pin insert, and a 32  patella.  We had to do  a small lateral release to get the patella to track perfectly.  I then  removed the trial components and copiously irrigated.  I then cemented in  the components, removed excess cement with the knee in extension, cement  hardened.  I then left a Hemovac coming out superolaterally and deep to the  arthrotomy, a pain catheter coming out superomedial and superficial to the  arthrotomy.  I closed the arthrotomy  with interrupted figure-of-eight #1 Vicryl sutures, deep soft tissues with  interrupted buried 0 Vicryl sutures, and then a subcuticular 2-0 Vicryl  stitch and skin staples.  I dressed the knee with Xeroform dressing, sponge,  sterile Webril, and Ted stockings.  Complications:   None.  Drains:  One  Hemovac, 1 pain catheter.  EBL 300 mL.           ______________________________  Mila Homer. Sherlean Foot, M.D.     SDL/MEDQ  D:  11/20/2005  T:  11/21/2005  Job:  [id number]

## 2010-08-26 NOTE — H&P (Signed)
Melissa Tucker, Melissa Tucker                 ACCOUNT NO.:  [id number]   MEDICAL RECORD NO.:  [id number]          PATIENT TYPE:  INP   LOCATION:  NA                           FACILITY:  [facility]   PHYSICIAN:  Mila Homer. Melissa Tucker, M.D. DATE OF BIRTH:  1951/04/05   DATE OF ADMISSION:  11/20/2005  DATE OF DISCHARGE:                                HISTORY & PHYSICAL   CHIEF COMPLAINT:  Right knee pain for the last 40 years.   HISTORY OF PRESENT ILLNESS:  This 60 year old black female patient presented  to Dr. Sherlean Tucker with a 4-year history of gradual onset but progressively  worsening right knee pain.  She does have a history of a right knee  arthroscopy in 1996 but no other injury or surgery to her knee.  At this  point, the pain is described as a constant sharp knife-like sensation  diffuse about the joint with radiation down into the tibia at times and  occasionally up into the hip.  It increases with nothing specific and  decreases with the use of Vicodin.  The knee pops, lock, swells and keeps  her up at night.  There is no grinding, catching or giving way.  She has  received cortisone shots in the past with relief for about 2-3 weeks.  She  is not ambulating with any assistive devices.   ALLERGIES:  NO KNOWN DRUG ALLERGIES.   CURRENT MEDICATIONS:  1.  Glyburide 5/500 mg 2 tablets p.o. b.i.d.  2.  Glimepiride 2 mg 2 tablets p.o. q.a.m.  3.  Lisinopril 10 mg p.o. q.a.m.  4.  Sertraline 50 mg 1 tablet p.o. q.a.m.  5.  Simvastatin 40 mg 1 tablet p.o. q.a.m.  6.  Dicyclomine 10 mg 1 tablet p.o. a.c.  7.  Vitamin E 1 tablet p.o. q.a.m.  8.  Aspirin 325 mg 1 tablet p.o. q.a.m.  Last dose November 14, 2005.  9.  Lasix unknown dosage 1 tablet p.o. q.a.m. p.r.n. swelling.   PAST MEDICAL HISTORY:  1.  History of a DVT in the right leg in January 1989 after she had a child      in the end of 35.  2.  Type 2 diabetes mellitus diagnosed in 1994.  3.  Hypertension.  4.  Hypercholesterolemia.  5.   Depression.  6.  Irritable bowel syndrome.   PAST SURGICAL HISTORY:  1.  Left total knee arthroplasty by Dr. Mila Homer.  Lucey January 13, 2002.  2.  Left shoulder arthroscopy by Dr. Mila Homer.  Lucey November 04, 2003.  3.  Cholecystectomy.  4.  Hysterectomy.  5.  Right knee arthroscopy 1996 by a doctor in [place], [place].  6.  Cesarean section in 1988.   She denies any complications from the above-mentioned procedures.   SOCIAL HISTORY:  She does have a 5 pack-year history of cigarette smoking  which she quit 20 years ago.  She does not drink any alcohol nor use any  drugs.  She is divorced and is disabled.  She lives with her daughter in a  [place] house.  She has  three children ranging in age from 53-29, two boys  and a girl with her youngest being her daughter.  Her regular medical doctor  is Dr. Juanetta Tucker in [place], [place], and she saw him on November 09, 2005, and her OB/GYN is Dr. Emelda Tucker.   FAMILY HISTORY:  Mother is alive at age 32 with heart disease, cervical  cancer and hypertension.  Father died at age of 69 with esophageal cancer.  She has five brothers and two sisters, a sister with a history of  hypertension and a brother with a history of seizures.  Her children are all  alive and well.   REVIEW OF SYSTEMS:  She does have a history of chest pain several years ago  which was evaluated and was attributed to just gas.  She does have a history  of hypertension and the blood clots which was mentioned previously.  Does  have a history of diarrhea and constipation at times due to her irritable  bowel.  She has had hemorrhoids once.  She is a diabetic, has easy bruising  and some ankle swelling which she treats with the diuretic.  She does have  history of a bladder infection with greater dysuria several years ago.  She  does have nocturia two to three times a night.  She does get dizzy at times  when her blood sugar is low.  She has a history of headaches and  migraines  with sinus problems.  She has had a weight loss of about 10 pounds in the  last 2-3 months due to her irritable bowel.  All other systems are negative  and noncontributory.   PHYSICAL EXAMINATION:  GENERAL APPEARANCE:  Well-developed, well-nourished,  mildly overweight black female in no acute distress.  Talks easily with  examiner.  Height 5 feet, 9-1/2 inches, weight 210 pounds, BMI is 30.  Walks  with a slight limp.  VITAL SIGNS:  Temperature 97.8 degrees Fahrenheit, pulse 72, respirations 16  and BP 134/70.  HEENT:  Normocephalic, atraumatic without frontal or maxillary sinus  tenderness to palpation.  Conjunctiva pink.  Sclerae anicteric.  PERRLA.  EOMs intact.  No visible external ear deformities.  Hearing grossly intact.  Tympanic membranes pearly gray bilaterally with good light reflex.  Nose and  nasal septum midline.  Nasal mucosa pink and moist without exudates or  polyps noted.  Buccal mucosa pink and moist.  Dentition in good repair.  She  is missing several teeth and does have partial plate dentures which she does  not wear.  Pharynx without erythema or exudates.  Tongue and uvula midline.  Tongue without fasciculations and uvula rises equally with phonation.  NECK:  No visible masses or lesions noted.  Trachea midline.  No palpable  lymphadenopathy or thyromegaly.  Carotids +2 bilaterally without bruits.  Full range of motion, nontender to palpation along the cervical spine.  CARDIOVASCULAR:  Heart rate and rhythm regular.  S1-S2 present without rubs,  clicks or murmurs noted.  RESPIRATORY:  Respirations even and unlabored.  Breath sounds clear to  auscultation bilaterally without rales or wheezes noted.  ABDOMEN:  Rounded abdominal contour.  Bowel sounds present x4 quadrants.  Soft, nontender to palpation without hepatosplenomegaly nor CVA tenderness.  Femoral pulses +2 bilaterally.  Nontender to palpation along the vertebral   column. BREASTS/GU/RECTAL/PELVIC:  These exams deferred at this time.  MUSCULOSKELETAL:  No obvious deformities bilateral upper extremities with  full range of motion of these extremities without pain.  Radial  pulses +2  bilaterally.  Full range of motion of her hips, ankles and toes bilaterally.  DP and PT pulses are +2.  She does have mild lower extremity edema that is  mildly pitting.  No calf pain with palpation.  Negative Homans' sign  bilaterally.  Left knee has a well-healed midline incision.  No erythema or ecchymosis.  She has full extension and flexion to 120 degrees without crepitus.  There  is no pain with palpation along the joint line.  No effusion.  Stable to  varus and valgus stress.  Negative anterior drawer.  Right knee skin intact.  No erythema or ecchymosis.  The knee seems to be resting about 10 degrees of  valgus.  She has full extension and flexion to 128 degrees with a mild  amount of crepitus with range of motion.  She is tender to palpation over  the lateral joint line none medially.  No effusion.  Stable to varus and  valgus stress, but does complain of pain with a varus stress.  NEUROLOGIC:  Alert and oriented x3.  Cranial nerves II-XII are grossly  intact.  Strength 5/5 bilateral upper and lower extremities.  Rapid  alternating movements intact.  Deep tendon reflexes 2+ bilateral upper and  lower extremities.  Sensation intact to light touch.   RADIOLOGIC FINDINGS:  Four views taken of the right knee in March 2006 show  severe valgus knee with severe osteoarthritis, bone on bone, in the lateral  compartment.   IMPRESSION:  1.  End-stage osteoarthritis right knee status post left knee replacement.  2.  Type 2 diabetes mellitus.  3.  Hypertension.  4.  Hypercholesterolemia.  5.  Depression.  6.  Irritable bowel syndrome.  7.  History of right lower extremity deep venous thrombosis after      childbirth.   PLAN:  Ms. Davia will be admitted to [facility]  [facility] on  November 20, 2005, where she will undergo a right total knee arthroplasty by  Dr. Mila Homer.  Lucey.  She will undergo all the routine preoperative  laboratory tests and studies prior to this procedure.  If she has any  medical issues while she is hospitalized, we will consult the Hospitalists.      Legrand Pitts Duffy, P.A.    ______________________________  Mila Homer. Melissa Tucker, M.D.    KED/MEDQ  D:  11/14/2005  T:  11/14/2005  Job:  [id number]

## 2010-08-26 NOTE — Discharge Summary (Signed)
Melissa, Tucker                 ACCOUNT NO.:  [id number]   MEDICAL RECORD NO.:  [id number]          PATIENT TYPE:  INP   LOCATION:  5039                         FACILITY:  [facility]   PHYSICIAN:  Mila Homer. Sherlean Foot, M.D. DATE OF BIRTH:  1950-07-25   DATE OF ADMISSION:  11/20/2005  DATE OF DISCHARGE:  11/23/2005                                 DISCHARGE SUMMARY   ADMITTING DIAGNOSES:  1. End-stage osteoarthritis right knee.  2. History of a left total knee replacement.  3. Type 2 diabetes mellitus.  4. Hypertension.  5. Hypercholesterolemia.  6. Depression.  7. Irritable bowel syndrome.  8. History of right lower extremity deep venous thrombosis after      childbirth.   DISCHARGE DIAGNOSES:  1. Status post a right total knee arthroplasty.  2. Acute blood loss anemia, secondary to surgery, requiring 2 units of      packed red blood cells.  3. Poor control of diabetes, meds adjusted.  4. Hypertension.  5. Hypercholesterolemia.  6. Depression.  7. Irritable bowel syndrome.  8. History of right leg deep venous thrombosis after childbirth.   HPI:  Melissa Tucker is a 60 year old African American female with a 4 year  history of gradual onset, but progressively worsening right knee pain.  History of a right knee scope in 1996; otherwise, no injuries or other  surgeries.  Pain is described as constant, sharp, knife-like and diffuse  pain about the joint with radiation down into the tibia at times and  occasionally up to the hip.  Pain improves with the use of Vicodin.  She  does have waking pain.  Denies any mechanical symptoms.  She has failed  conservative treatment, which included cortisone injections.  Uses no  devices to ambulate.   ALLERGIES:  NO KNOWN DRUG ALLERGIES.   MEDICATIONS:  1. Glyburide 5/500 mg two tabs p.o. b.i.d.  2. Glyburide 2 mg two tabs p.o. q.a.m.  3. Lisinopril 10 mg p.o. q.a.m.  4. ___________ 50 mg one tab p.o. q.a.m.  5. Nexium 40 mg one tab p.o. q.a.m.  6. Dicyclomine 10 mg one tab p.o. a.c.  7. Vitamin E one tab p.o. q.a.m.  8. Aspirin 325 mg p.o. one tab p.o. q.a.m.  9. Lasix dosage unknown one tab q.a.m. p.r.n. swelling.   SURGICAL PROCEDURE:  The patient was taken to the operating room on November 20, 2005 by Dr. Georgena Spurling, assisted by Dr. Jacqualine Code, PA-C.  The  patient was placed under general anesthesia and then a right total knee  arthroplasty was performed.  The following components were used, femoral  components at size F, a 10 mm bearing and a size 4 occluded stem tibial  component.  The patient tolerated the procedure well and returned to  recovery in good and stable condition.   HOSPITAL CONSULTS:  For PT/OT, case management and diabetes mellitus  coordinator.   HOSPITAL CONSULTS:  Postop day 1, the patient denied any chest pain,  shortness of breath, dizziness or lightheadedness.  T-Max was 100.0.  H&H  was 9.0 and 26.9.  CBG ranged  from 206 to 199.   Postop day 2, patient afebrile.  Vital signs stable.  Denied any chest pain,  shortness of breath, nausea or vomiting.  Pain under adequate control.  H&H  was 7.8 and 23.1.  Therefore, the patient was transfused 2 units of packed  red blood cells.  Diabetes mellitus meds were adjusted due to poor glucose  control with a hemoglobin A1c of 8.1.  Patient complaining of right lower  leg tenderness with a history of right lower leg DVT; therefore, a Doppler  was ordered.   Postop day 3, patient denied chest pain, shortness of breath, nausea,  vomiting and progressing well with PT.  Ambulating about the room on her own  accord.  No dizziness.  T-Max was 99.0.  Pulse 105.  Respiratory rate 20.  Blood pressure was 137/80.  Oxygen saturation was 93% on room air.  H&H is  8.8 and 25.3.  CBGs ranged from 247 to 138.  Doppler negative for DVT.  Patient was started on iron and was to follow up with Dr. Juanetta Gosling in 1 to 3  days to followup diabetes mellitus and acute blood loss  anemia.  Patient is  to followup, at least, 14 days after surgery.  The patient was discharged  later that day after working with physical therapy to home in good  condition.   LABS:  Routine labs on admission, CBC dated November 17, 2005:  White count  was 5900, hemoglobin is 11.9 low, hematocrit was 36.0 and platelets were  312.  Coags on admission, all values within normal limits.  Routine  chemistries on admission:  Sodium 135, potassium 4.0, chloride 102, bicarb  was 26, glucose was elevated at 219, BUN was 12, creatinine 0.9, calcium  9.7.  Hepatic enzymes on admission:  AST 21, ALT 25, ALP was elevated at  120, total bilirubin is 0.8.  Urinalysis on admission was negative.  Glucose  was elevated at 100 mgdL.  EKG on admission showed normal sinus rhythm with  left ventricular hypertrophy.  Heart rate 75 beats per minute PR interval  174 milliseconds, PRT axis 68, -1165.  Doppler performed on November 22, 2005  showed right lower leg with no evidence of DSVT or Baker's cyst.   DISCHARGED INSTRUCTIONS:  1. Meds:  The patient was to resume home meds, except for Amaryl and      glyburide.  No aspirin while on Lovenox.  No vitamin E while on      Lovenox.  No hydrocodone while on Percocet.  2. Add the following:      a.     Lovenox 40 mg one injection daily at 8 a.m., last dose December 05, 2005.      b.     NovoLog sliding scale t.i.d. with meals CBG 60 to 100 equals 0       units, 101 to 250 two units, 251 to 300 seven units, greater than 350       11 units, greater than 400 call MD      c.     Iron 325 mg one tab daily x3 days.      d.     Percocet 5/325 one to two tablets every 4 to 6 hours for pain.      e.     Lantus 15 mg one nightly at 10 p.m.   FOLLOWUP:  1. Patient needs follow up with Dr. Sherlean Foot 14 days postop.  Patient  is to      call office at [phone] for appointment. 2. Patient is to follow up with primary care physician, Dr. Juanetta Gosling, for      diabetes mellitus  management, 2 to 3 days from discharge.  Patient to      call office to make an appointment.  3. Home health PT, per [facility].   SPECIAL INSTRUCTIONS:  CPM 0 to 90 degrees 6 to 8 hours a day, increase by  10 degrees daily.   CONDITION ON DISCHARGE:  Patient was discharged to home in good and stable  condition.      Richardean Canal, P.A.    ______________________________  Mila Homer. Sherlean Foot, M.D.    GC/MEDQ  D:  01/10/2006  T:  01/10/2006  Job:  [id number]   cc:   Ramon Dredge L. Juanetta Gosling, M.D.

## 2010-08-26 NOTE — Op Note (Signed)
NAME:  Melissa Tucker, Melissa Tucker                           ACCOUNT NO.:  [id number]   MEDICAL RECORD NO.:  [id number]                   PATIENT TYPE:  AMB   LOCATION:  DSC                                  FACILITY:  [facility]   PHYSICIAN:  Mila Homer. Sherlean Foot, M.D.              DATE OF BIRTH:  1950-08-08   DATE OF PROCEDURE:  11/04/2003  DATE OF DISCHARGE:                                 OPERATIVE REPORT   PREOPERATIVE DIAGNOSIS:  Left shoulder impingement syndrome, labral tear and  adhesive capsulitis.   POSTOPERATIVE DIAGNOSIS:  Left shoulder impingement syndrome, labral tear  and adhesive capsulitis   PROCEDURE:  Left shoulder manipulation with subacromial decompression and  labral debridement.   SURGEON:  Mila Homer. Sherlean Foot, M.D.   INDICATIONS:  The patient is a 60 year old who has failed conservative  measures with therapy and medication and shots for her left shoulder  problem.  MRI revealed labral tear, SLAP type lesion as well as rotator cuff  tendonopathy and adhesive capsulitis.  Informed consent was obtained.   DESCRIPTION OF PROCEDURE:  The patient was taken to the operating room and  administered general anesthesia in the supine position and then placed in  the beach chair position.  The left upper extremity was prepped and draped  in the usual sterile fashion.  Anterior, posterior and direct lateral  portals were created with a #11 blade, blunt trocars and cannulas.  I began  the procedure with a manipulation with standard technique.  I got her from  100 degrees of elevation to a full 150 degrees of elevation and from 20  degrees of external rotation to a full 50-55 degrees of external rotation.  Upon entering the glenohumeral joint, there was some obvious bleeding from  the manipulation.  Scar tissue was debrided, and the ArthroCare wand was  used to obtain hemostasis.  There was some frayed degenerative type tearing  of the anterior and superior labrum.  However, I did not feel that  there was  a labral tear at all.  I felt this was a normal variant.  I did use a 3.2  Gator shaver to debride the labrum back to a stable rim of tissue.  The  undersurface of the rotator cuff looked good and firmly attached.  I then  went from posterior into the subacromial space where I used the ArthroCare  wand to obtain hemostasis.  I then performed a subtotal bursectomy as well  as an anterior and lateral acromioplasty.  I did the acromioplasty with a  4.0 mm cylindrical bur.  I went over to but not into the [facility] joint.  I  released the CA ligament with the ArthroCare debridement want and after  completing the bursectomy I then evacuated the joint.  I closed with  interrupted 4-0 nylon sutures, dry sterile 4 x 4, ABDs, 2 inch silk tape and  a single sling.  I did  infiltrate 20 cc of 0.5% Marcaine with epinephrine  and morphine mixture.  The patient tolerated the procedure well.  Complications none.  Drains none.                                               Mila Homer. Sherlean Foot, M.D.    SDL/MEDQ  D:  11/04/2003  T:  11/04/2003  Job:  [id number]

## 2010-08-26 NOTE — Discharge Summary (Signed)
NAME:  Melissa Tucker, Melissa Tucker                           ACCOUNT NO.:  [id number]   MEDICAL RECORD NO.:  [id number]                   PATIENT TYPE:  INP   LOCATION:  5028                                 FACILITY:  [facility]   PHYSICIAN:  Mila Homer. Sherlean Foot, M.D.              DATE OF BIRTH:  [birth year]   DATE OF ADMISSION:  01/13/2002  DATE OF DISCHARGE:  01/17/2002                                 DISCHARGE SUMMARY   DISPOSITION:  To home.   ADMISSION DIAGNOSES:  1. End-stage osteoarthritis bilateral knees bone-on-bone lateral compartment     left knee.  2. Diabetes mellitus type 2.  3. Hypertension.   DISCHARGE DIAGNOSES:  1. Left total knee arthroplasty.  2. Postoperative blood loss anemia.  3. Elevated blood glucose with a history of diabetes mellitus type 2.  4. Hypertension.   HISTORY OF PRESENT ILLNESS:  The patient is a 60 year old black female with  a history of left knee pain for several years.  The patient has had multiple  arthroscopes with only short-term improvement, pain has progressively  worsened with time and it currently significantly increases with any type of  activities.  The pain is described as a sharp pain with ambulating.  She  does have swelling and popping.  X-rays reveal bone-on-bone lateral  compartment.   ALLERGIES:  No known drug allergies.   CURRENT MEDICATIONS:  1. Vicodin p.r.n.  2. Effexor XR 75 mg p.o. q.d.  3. Altace 5 mg p.o. q.d.  4. Amaryl 2 mg p.o. q.d.  5. Glucophage XR 500 mg p.o. q.d.   SURGICAL PROCEDURE:  On January 13, 2002, the patient was taken to the OR by  Dr. Georgena Spurling assisted by Jamelle Rushing, P.A.-C.  Under general  anesthesia, the patient underwent a left total knee arthroplasty.  Tourniquet time was 50 minutes.  There were no complications.  One Hemovac  drain was left in placed.  Estimated blood loss was 300 cc.  The patient  tolerated the procedure well.  One attempt at the postoperative femoral  nerve block was made but  was unable to perform.  The patient was transferred  to the recovery room and then to the orthopedic floor in good condition.   CONSULTATIONS:  The following routine consults were requested - physical  therapy, occupational therapy, rehab, case management.   HOSPITAL COURSE:  On January 13, 2002, the patient was admitted to [facility] under the care of Dr. Georgena Spurling.  The patient was taken to the  OR where a left total knee arthroplasty was performed.  The patient  tolerated this procedure well and was transferred to the recovery room and  then to the orthopedic floor in good condition.  The patient was placed on  Lovenox for routine DVT prophylaxis.   The patient then incurred a total of 4 days postoperative care on the  orthopedic floor in  which the patient worked well with physical therapy.  Her pain was slightly more difficult to get control of but the patient was  fairly comfortable upon discharge and then we were going to add some  OxyContin.  The patient worked well with physical therapy.  Her wound  remained benign for any signs of infection.   The patient did develop some postoperative blood loss anemia with her  hemoglobin dropping to 8.9.  It was felt due to her elevated heart rate.  The patient was slightly symptomatic so she was typed and crossed and  transfused 1 unit of packed red blood cells with expected results and no  complications.   The patient's blood glucose during her hospitalization remained elevated but  the patient indicated she was getting more sweets in her diet than she  normally would.  On postop day #4, it was felt the patient was ready for  discharge home so arrangements were made for home health physical therapy  and she was discharged home in good condition.   LABORATORIES:  CBC on January 16, 2002 - WBC 8.7, hemoglobin 9.4, hematocrit  29.0, platelets 225.   Routine chemistries on January 15, 2002 - sodium of 133, potassium of 4.3,   glucose 263 down from a high of 283 with daily CBGs being monitored with  vitals ranging anywhere from 349 down to a low of 205.  This was included  with the patient's Amaryl and newly started Glucophage.  The patient  received a total of 1 unit of packed red blood cells during hospitalization.   MEDICATIONS UPON DISCHARGE FROM ORTHOPEDIC FLOOR:  1. Regular Human Insulin routine protocol sliding scale.  2. Colace 100 mg p.o. b.i.d.  3. Trinsicon one capsule p.o. t.i.d.  4. Lovenox 30 mg subcu q.12h.  5. Effexor 75 mg p.o. q.d.  6. Altace 5 mg p.o. q.d.  7. Amaryl 2 mg p.o. q.d.  8. Glucophage 500 mg p.o. q.d.  9. OxyContin CR 10 mg p.o. q.12h.  10.      Laxative/enema of choice p.r.n.  11.      Percocet one or two tablets every 4-6 hours p.r.n.  12.      Tylenol 650 mg p.o. q.4h. p.r.n.   DISCHARGE INSTRUCTIONS:  1. Medications: The patient to resume routine home meds.     a. OxyContin CR 10 mg one tablet every 12 hours.  The patient may adjust        this dose to save pills for nighttime use only.     b. Percocet 5 mg one or two tablets every 4-6 hours for pain if needed.     c. Lovenox injection 40 mg injection once a day for 10 days.  2. Blood glucose: The patient is to monitor and chart her glucose levels on     her home diet and discuss with Dr. Juanetta Gosling.  3. Activity: The patient may be ambulating as tolerated.  She may shower but     she is not to drive.  4. Diet: No restrictions other than following her close diabetic diet.  5. Wound care: The patient should keep wound clean and dry, check daily for     any signs of infection, call Dr. Tobin Chad office with any questions.  6. Followup:     a. The patient should have a followup appointment with Dr. Sherlean Foot in 10        days - call [phone] for an appointment.     b. The  patient should also make arrangements for glucose monitoring with        her primary care physician.  CONDITION UPON DISCHARGE:  The patient's condition upon  discharge to home is  listed as improved and good.     Jamelle Rushing, P.A.                      Mila Homer. Sherlean Foot, M.D.    RWK/MEDQ  D:  01/17/2002  T:  01/20/2002  Job:  [id number]

## 2010-08-26 NOTE — H&P (Signed)
NAME:  Melissa Tucker, Melissa Tucker                        ACCOUNT NO.:  [id number]   MEDICAL RECORD NO.:  [id number]                   PATIENT TYPE:   LOCATION:                                       FACILITY:   PHYSICIAN:  Mila Homer. Sherlean Foot, M.D.              DATE OF BIRTH:  03-13-1951   DATE OF ADMISSION:  01/13/2002  DATE OF DISCHARGE:                                HISTORY & PHYSICAL   CHIEF COMPLAINT:  Left knee pain.   HISTORY OF PRESENT ILLNESS:  The patient is a 60 year old black female with  a several year history of progressively worsening left knee pain. The  patient has had two arthroscopic procedures with short term improvement. The  patient initially started having  discomfort and giving away of  her knee.  It has progressively worsened to a significant amount  of sharp pain in the  knee with active weight bearing activities. The patient reinjured her knee  this last year when she fell at work. She describes the pain as located  primarily within the knee without any significant radiation. She does have  swelling. She does have mechanical symptoms such as popping and clicking  with weight bearing activities. X-rays showed bone on bone lateral  compartment left knee.   ALLERGIES:  No known drug allergies.   CURRENT MEDICATIONS:  1. Vicodin p.r.n.  2. Effexor XR 75 mg p.o. q.d.  3. Altace 5 mg p.o. q.d.  4. Amaryl 2 mg p.o. q.d.   PAST MEDICAL HISTORY:  1. Type 2 diabetes mellitus.  2. Hypertension.   PAST SURGICAL HISTORY:  1. Arthroscopic procedure on the left knee x 2.  2. Hysterectomy.  3. Cholecystectomy.  4. The patient denies any complications of any of any of the above mentioned     surgical procedures.   SOCIAL HISTORY:  The patient is a healthy appearing, tall black female.  Denies any history of smoking, no alcohol. She is currently divorced. She  does have three grown children. She currently is employed as a Programmer, applications. She lives in a one story  house.  Her family physician is Dr.  Garnette Czech.   FAMILY HISTORY:  Her mother is alive with a cardiac history, hypertension  and a history of uterine cancer. Her father is deceased from lung cancer.  The patient has five brothers and two sisters, all alive and in good health.   REVIEW OF SYSTEMS:  Negative for any general,  sensory, respiratory,  cardiac, GI, GU, hematologic, musculoskeletal, neurologic or mental status  problems.   PHYSICAL EXAMINATION:  GENERAL:  Height 5 feet, 9 inches, weight 201 pounds.  This is a healthy appearing, well developed, tall black female. She  ambulates very slowly without any significant limp. She is able to get  herself on and off the exam table without difficulty with weightbearing. She  does have knock knee deformities.  VITAL SIGNS:  Pulse 72  and regular, respirations 12, temperature 97.0, blood  pressure 160/88.  HEENT:  Normocephalic, atraumatic. Nontender maxillary or frontal sinuses.  Pupils equally round and reactive accommodating to light. Extraocular  movements intact.  Sclerae anicteric.  Conjunctivae pink and moist.  External ears without deformities. Canals patent, tympanic membranes pearly  gray and  intact.  Gross hearing is  intact. Nasal septum benign. Mucous  membranes pink and moist. Buccal mucosa was pink and moist without lesions.  Dentition was in fair repair. Uvula was midline. The patient was able to  swallow without difficulty.  NECK:  Supple, no palpable lymphadenopathy. Thyroid gland was nontender. She  had excellent range of motion of her cervical spine without any difficulty  or tenderness.  CHEST:  Lung sounds were clear and equal bilaterally, no rales, rhonchi or  wheezes or rubs noted.  HEART:  Regular rate and rhythm, S1 and S2 were auscultated. No murmurs,  rubs, gallops noted.  ABDOMEN:  Flat,  soft, nontender. Bowel sounds were normoactive throughout.  No hepatosplenomegaly were palpable. CVA was nontender.   EXTREMITIES:  Upper extremities were symmetric in size and shape. She had  excellent range of motion of her shoulders, elbows and wrists without  difficulty.  Lower extremities, the right and left hip had full range of  motion without difficulties. No mechanical symptoms with full extension. The  right and left knee had a 15 degree valgus deformity each. The left knee was  without any signs of erythema or ecchymosis. She was tender along the medial  and moreso on the lateral joint line. She had about a 5 degree valgus varus  laxity. The range of motion was from 0 to 120. She had no calf tenderness.  The right knee was without any signs of erythema or ecchymosis. She was  slightly tender along the joint line, moreso on the lateral. She had range  of motion from 0 to 120 degrees with no anterior or posterior drawer but she  had 5 degrees of valgus varus laxity. Bilateral calves were nontender.  Bilateral ankles were symmetrical with good dorsoplantar flexion.  PERIPHERAL VASCULAR:  Carotid pulses were 2+ and no bruits. Radial pulses  were 2+. Dorsalis pedis pulses and posterior tibial pulses were 2+. She had  no lower extremity edema or venous stasis changes.  NEUROLOGIC:  The patient was  conscious, alert and appropriate. She had an  easy conversation. Examination of cranial nerves II through XII was grossly  intact. Deep tendon reflexes of the  upper and extremities were symmetrical  and 2+. She was grossly intact to light touch and sensation from head to  toe.  BREAST, RECTAL AND GU:  Exams were deferred at this time.   IMPRESSION:  1. End-stage osteoarthritis, bilateral knees with bone on bone lateral     compartment left knee.  2. Diabetes mellitus type 2.  3. Hypertension.   PLAN:  The patient will be admitted to [facility] on January 13, 2002,  under the care of Dr. Mila Homer. Lucey.  The patient will undergo all routine labs and tests prior to  having  a left total knee  arthroplasty.     Jamelle Rushing, P.A.                      Mila Homer. Sherlean Foot, M.D.    RWK/MEDQ  D:  01/07/2002  T:  01/09/2002  Job:  [id number]

## 2010-08-26 NOTE — Op Note (Signed)
NAME:  Melissa Tucker, SPELLS                           ACCOUNT NO.:  [id number]   MEDICAL RECORD NO.:  [id number]                   PATIENT TYPE:  INP   LOCATION:  2875                                 FACILITY:  [facility]   PHYSICIAN:  Mila Homer. Sherlean Foot, M.D.              DATE OF BIRTH:  1950/08/12   DATE OF PROCEDURE:  01/13/2002  DATE OF DISCHARGE:                                 OPERATIVE REPORT   PREOPERATIVE DIAGNOSIS:  Left knee osteoarthritis.   POSTOPERATIVE DIAGNOSIS:  Left knee osteoarthritis.   OPERATION/PROCEDURE:  Left total knee arthroplasty.   SURGEON:  Mila Homer. Sherlean Foot, M.D.   ASSISTANT:  Jamelle Rushing, P.A.   ANESTHESIA:  General.   INDICATIONS:  The patient is a 60 year old black female status post failure  of conservative measures for osteoarthritis of the knee. Informed consent  was obtained.   DESCRIPTION OF PROCEDURE:  The patient was laid supine, administered general  anesthesia and a Foley catheter placed. The left lower extremity was prepped  and draped in the usual sterile fashion.  A midline incision was made with a  #10 blade. A fresh blade was used to make a median parapatellar arthrotomy  and the patella was everted. The patella was measured to 22 mm. A 32 mm  reamer was used to ream down to 13 mm and we then removed the excess bone  and used the 29 template and drilled three locking holes, and with the  prosthetic trial in place, the fitness also measured 22 mm. We left the  patella everted and removed the prosthetic trial and went into flexion.  Subperiosteally dissected the deep MCL off the medial crest of the tibia. We  tapped the ACL and the PCL. At this point we aligned the extramedullary  guide for the tibia to make a perpendicular cut to the anatomic access  removing 2 mm of bone off the lateral tibial plateau. I used the sagittal  saw to cut this and removed it from its soft tissue attachments with the  cautery. We then removed the extramedullary  guide and turned our attention  to the femur where I made an intramedullary drill hole and an intramedullary  guide set on 4 degree valgus cut. I then put the distal femoral cutting  block in place, pinned it in place and made the distal femoral cut. I then  removed the arm guide and cutter and drew our epicondylar access. The  posterior condylar angle measured 3 degrees. We sized to a size E and pinned  it to the 3 degree external rotation holes and then pinned in our 4-in-1  cutter. We made our anterior, posterior and chamfer cuts. We removed the cut  pieces of bone. I then placed the laminar spreader in the lateral  compartment with the knee in 90 degrees of flexion. We removed the ACL, PCL,  medial meniscus and posterior medial osteophytes.  I then placed the laminar  spreader in the medial compartment and removed the lateral meniscus and  posterior condylar osteophytes. At this point I then put the 10 mm spacer  block in place; we had excellent flexion and extension and got balancing. I  removed the spacer block and placed a scoop retractor in place. I used the  sizing finishing block on the femur and pinned it into place in the condylar  notch and lug holes. We then removed the finishing block and put a size 4  tibia finishing tray on the tibia; drilled and keeled that. We then trialed  with a size 4 tibia, size E femur, size 10 poly insert, size 29 patella. We  had excellent placement flexion and extension and we got balancing and drop  at the angle with 125 degrees. We removed the trial components and irrigated  copiously and then cemented in the size 4 tibia first, size E second, size  29 patella third and then snapped in the real polyethylene. After the cement  was hardened, we let the tourniquet down and cauterized the bleeding vessels  and placed a Hemovac due to the arthrotomy. We closed with interrupted #1  Vicryls in the arthrotomy, interrupted #0 Vicryls in the deep soft  tissues,  running 3-0 Vicryl in subcuticular layer and skin staples. We dressed with  Adaptic, 4 x 4s, sterile web roll and TED stocking.   TOURNIQUET TIME:  50 minutes.   COMPLICATIONS:  none.   DRAINS:  One Hemovac.   ESTIMATED BLOOD LOSS:  300 cc.                                                Mila Homer. Sherlean Foot, M.D.    SDL/MEDQ  D:  01/13/2002  T:  01/13/2002  Job:  [id number]

## 2010-11-14 ENCOUNTER — Other Ambulatory Visit (HOSPITAL_COMMUNITY): Payer: Self-pay | Admitting: Pulmonary Disease

## 2010-11-14 DIAGNOSIS — Z139 Encounter for screening, unspecified: Secondary | ICD-10-CM

## 2010-11-21 ENCOUNTER — Ambulatory Visit (HOSPITAL_COMMUNITY)
Admission: RE | Admit: 2010-11-21 | Discharge: 2010-11-21 | Disposition: A | Discharge status: Home / Self Care | Payer: Medicare Other | Source: Ambulatory Visit | Attending: Pulmonary Disease | Admitting: Pulmonary Disease

## 2010-11-21 DIAGNOSIS — Z1231 Encounter for screening mammogram for malignant neoplasm of breast: Secondary | ICD-10-CM | POA: Insufficient documentation

## 2010-11-21 DIAGNOSIS — Z139 Encounter for screening, unspecified: Secondary | ICD-10-CM

## 2011-03-28 ENCOUNTER — Encounter: Payer: Self-pay | Admitting: Cardiology

## 2011-05-15 DIAGNOSIS — Z01419 Encounter for gynecological examination (general) (routine) without abnormal findings: Secondary | ICD-10-CM | POA: Diagnosis not present

## 2011-05-15 DIAGNOSIS — R809 Proteinuria, unspecified: Secondary | ICD-10-CM | POA: Diagnosis not present

## 2011-05-15 DIAGNOSIS — Z1212 Encounter for screening for malignant neoplasm of rectum: Secondary | ICD-10-CM | POA: Diagnosis not present

## 2011-05-16 DIAGNOSIS — I10 Essential (primary) hypertension: Secondary | ICD-10-CM | POA: Diagnosis not present

## 2011-05-16 DIAGNOSIS — Z23 Encounter for immunization: Secondary | ICD-10-CM | POA: Diagnosis not present

## 2011-05-16 DIAGNOSIS — E785 Hyperlipidemia, unspecified: Secondary | ICD-10-CM | POA: Diagnosis not present

## 2011-05-16 DIAGNOSIS — M1990 Unspecified osteoarthritis, unspecified site: Secondary | ICD-10-CM | POA: Diagnosis not present

## 2011-08-22 ENCOUNTER — Encounter: Payer: Self-pay | Admitting: Orthopedic Surgery

## 2011-08-22 ENCOUNTER — Ambulatory Visit (INDEPENDENT_AMBULATORY_CARE_PROVIDER_SITE_OTHER): Payer: Medicare Other

## 2011-08-22 ENCOUNTER — Ambulatory Visit (INDEPENDENT_AMBULATORY_CARE_PROVIDER_SITE_OTHER): Payer: Medicare Other | Admitting: Orthopedic Surgery

## 2011-08-22 VITALS — BP 130/58 | Ht 69.0 in | Wt 207.0 lb

## 2011-08-22 DIAGNOSIS — M75101 Unspecified rotator cuff tear or rupture of right shoulder, not specified as traumatic: Secondary | ICD-10-CM | POA: Insufficient documentation

## 2011-08-22 DIAGNOSIS — M25519 Pain in unspecified shoulder: Secondary | ICD-10-CM

## 2011-08-22 DIAGNOSIS — M719 Bursopathy, unspecified: Secondary | ICD-10-CM

## 2011-08-22 DIAGNOSIS — M67919 Unspecified disorder of synovium and tendon, unspecified shoulder: Secondary | ICD-10-CM | POA: Diagnosis not present

## 2011-08-22 HISTORY — DX: Unspecified rotator cuff tear or rupture of right shoulder, not specified as traumatic: M75.101

## 2011-08-22 NOTE — Progress Notes (Signed)
Patient ID: Melissa Tucker, female   DOB: [birth year], 61 y.o.   MRN: [id number] Chief complaint: RIGHT shoulder for 3 months HPI:(4) The patient has had pain in the RIGHT shoulder joint, otherwise, very much, which came on gradually. She reports throbbing, 8/10 in intermittent pain, which occurs in the morning and night, mostly during the day. Pain is worse with lifting up or trying to raise the arm. She does report some numbness and tingling, locking and catching. She had history of a LEFT shoulder arthroscopy for similar disease. She is right-handed Interior and spatial designer.  ROS:(2) Review of systems, fever, chills, and fatigue, palpitations, heartburn, nausea, constipation, diarrhea, temperature intolerance anxiety, depression, and nervousness. The remaining systems are normal  PFSH: (1) Pertinent surgical history, and medical history include history 2 knee replacements. Surgeon and the LEFT shoulder, cholecystectomy, hysterectomy, history of diabetes, and hypertension, which make her at risk for adhesive capsulitis. Of note, she had a cyst removed from the RIGHT shoulder near the trapezius muscle more towards the neck and the shoulder. Skin looks normal;  do not think related.  Physical Exam(12) GENERAL: normal development   CDV: pulses are normal   Skin: normal  Lymph: nodes were not palpable/normal  Psychiatric: awake, alert and oriented  Neuro: normal sensation  MSK Cervical spine normal range of motion, normal muscle tone, normal alignment. No palpable tenderness. 1 LEFT shoulder. No contracture, no subluxation, no atrophy. No tremor. No tenderness portal sites from arthroscopy noted. 2 RIGHT shoulder is stiff and painful tenderness and swelling. There is decreased external rotation painful forward elevation. The cuff could not really be tested for elevation, internal and external rotation strength were normal. Skin is otherwise intact in abduction external rotation. There was pain, but no  instability. 3 Thoracic spine is normal in terms of alignment. There is no tenderness. Muscle tone is normal. No instability. Skin is normal. Over the thoracic spine  Imaging: X-ray RIGHT shoulder  Assessment: Rotator cuff syndrome/bursitis    Plan: Our recommendation is for nonoperative treatment with a subacromial injection and then a exercise program, which can be done at home or with a therapist for

## 2011-08-22 NOTE — Patient Instructions (Addendum)
You have received a steroid shot. 15% of patients experience increased pain at the injection site with in the next 24 hours. This is best treated with ice and tylenol extra strength 2 tabs every 8 hours. If you are still having pain please call the office.  Start physical therapy [facility]    Rotator Cuff Tendonitis   The rotator cuff is the collection of all the muscles and tendons (the supraspinatus, infraspinatus, subscapularis, and teres minor muscles and their tendons) that help your shoulder stay in place. This unit holds the head of the upper arm bone (humerus) in the cup (fossa) of the shoulder blade (scapula). Basically, it connects the arm to the shoulder. Tendinitis is a swelling and irritation of the tissue, called cord like structures (tendons) that connect muscle to bone. It usually is caused by overusing the joint involved. When the tissue surrounding a tendon (the synovium) becomes inflamed, it is called tenosynovitis. This also is often the result of overuse in people whose jobs require repetitive (over and over again) types of motion. HOME CARE INSTRUCTIONS    Use a sling or splint for as long as directed by your caregiver until the pain decreases.   Apply ice to the injury for 15 to 20 minutes, 3 to 4 times per day. Put the ice in a plastic bag and place a towel between the bag of ice and your skin.   Try to avoid use other than gentle range of motion while your shoulder is painful. Use and exercise only as directed by your caregiver. Stop exercises or range of motion if pain or discomfort increases, unless directed otherwise by your caregiver.   Only take over-the-counter or prescription medicines for pain, discomfort, or fever as directed by your caregiver.   If you were give a shoulder sling and straps (immobilizer), do not remove it except as directed, or until you see a caregiver for a follow-up examination. If you need to remove it, move your arm as little as possible or as  directed.   You may want to sleep on several pillows at night to lessen swelling and pain.  SEEK IMMEDIATE MEDICAL CARE IF:    Pain in your shoulder increases or new pain develops in your arm, hand, or fingers and is not relieved with medications.   You develop new, unexplained symptoms, especially increased numbness in the hands or loss of strength, or you develop any worsening of the problems which brought you in for care.   Your arm, hand, or fingers are numb or tingling.   Your arm, hand, or fingers are swollen, painful, or turn white or blue.  Document Released: 06/17/2003 Document Revised: 03/16/2011 Document Reviewed: 01/23/2008 [facility] Patient Information 2012 [place], [place].

## 2011-09-01 ENCOUNTER — Encounter: Payer: Self-pay | Admitting: Orthopedic Surgery

## 2011-09-05 ENCOUNTER — Inpatient Hospital Stay (HOSPITAL_COMMUNITY): Admission: RE | Admit: 2011-09-05 | Payer: Medicare Other | Source: Ambulatory Visit | Admitting: Specialist

## 2011-09-20 ENCOUNTER — Telehealth: Payer: Self-pay | Admitting: Orthopedic Surgery

## 2011-09-20 ENCOUNTER — Other Ambulatory Visit: Payer: Self-pay | Admitting: Orthopedic Surgery

## 2011-09-20 DIAGNOSIS — M62838 Other muscle spasm: Secondary | ICD-10-CM

## 2011-09-20 MED ORDER — METHOCARBAMOL 500 MG PO TABS: 500.0000 mg | ORAL_TABLET | Freq: Four times a day (QID) | ORAL | Status: AC

## 2011-09-20 MED ORDER — METHOCARBAMOL 500 MG PO TABS: 500.0000 mg | ORAL_TABLET | Freq: Four times a day (QID) | ORAL | Status: DC

## 2011-09-20 NOTE — Telephone Encounter (Signed)
Melissa Tucker asking for a prescription for a muscle relaxer.  She said she is having stiffness and pain in her shoulder.  She has not started therapy yet because of her job, but said she is to start it next week.  She uses [facility],said she does not have any medication allergies. [phone] or [phone]

## 2011-10-04 ENCOUNTER — Other Ambulatory Visit: Payer: Self-pay | Admitting: *Deleted

## 2011-10-04 ENCOUNTER — Inpatient Hospital Stay (HOSPITAL_COMMUNITY): Admission: RE | Admit: 2011-10-04 | Payer: Medicare Other | Source: Ambulatory Visit | Admitting: Physical Therapy

## 2011-10-04 DIAGNOSIS — M75101 Unspecified rotator cuff tear or rupture of right shoulder, not specified as traumatic: Secondary | ICD-10-CM

## 2011-10-05 ENCOUNTER — Inpatient Hospital Stay (HOSPITAL_COMMUNITY): Admission: RE | Admit: 2011-10-05 | Payer: Medicare Other | Source: Ambulatory Visit | Admitting: Specialist

## 2011-10-13 ENCOUNTER — Ambulatory Visit (HOSPITAL_COMMUNITY): Admission: RE | Admit: 2011-10-13 | Payer: Medicare Other | Source: Ambulatory Visit | Admitting: Specialist

## 2011-10-24 ENCOUNTER — Ambulatory Visit: Payer: Medicare Other | Admitting: Orthopedic Surgery

## 2011-11-13 DIAGNOSIS — M1990 Unspecified osteoarthritis, unspecified site: Secondary | ICD-10-CM | POA: Diagnosis not present

## 2011-11-13 DIAGNOSIS — E109 Type 1 diabetes mellitus without complications: Secondary | ICD-10-CM | POA: Diagnosis not present

## 2011-11-13 DIAGNOSIS — F411 Generalized anxiety disorder: Secondary | ICD-10-CM | POA: Diagnosis not present

## 2011-11-13 DIAGNOSIS — E785 Hyperlipidemia, unspecified: Secondary | ICD-10-CM | POA: Diagnosis not present

## 2011-12-09 ENCOUNTER — Other Ambulatory Visit: Payer: Self-pay | Admitting: Orthopedic Surgery

## 2012-01-05 ENCOUNTER — Other Ambulatory Visit (HOSPITAL_COMMUNITY): Payer: Self-pay | Admitting: Preventative Medicine

## 2012-01-05 DIAGNOSIS — M25519 Pain in unspecified shoulder: Secondary | ICD-10-CM

## 2012-01-09 ENCOUNTER — Ambulatory Visit (HOSPITAL_COMMUNITY): Payer: Medicare Other

## 2012-01-10 ENCOUNTER — Other Ambulatory Visit (HOSPITAL_COMMUNITY): Payer: Medicare Other

## 2012-01-12 ENCOUNTER — Ambulatory Visit (HOSPITAL_COMMUNITY)
Admission: RE | Admit: 2012-01-12 | Discharge: 2012-01-12 | Disposition: A | Discharge status: Home / Self Care | Payer: Medicare Other | Source: Ambulatory Visit | Attending: Preventative Medicine | Admitting: Preventative Medicine

## 2012-01-12 DIAGNOSIS — M25519 Pain in unspecified shoulder: Secondary | ICD-10-CM | POA: Insufficient documentation

## 2012-01-12 DIAGNOSIS — M67919 Unspecified disorder of synovium and tendon, unspecified shoulder: Secondary | ICD-10-CM | POA: Diagnosis not present

## 2012-01-12 DIAGNOSIS — M719 Bursopathy, unspecified: Secondary | ICD-10-CM | POA: Diagnosis not present

## 2012-01-12 DIAGNOSIS — IMO0002 Reserved for concepts with insufficient information to code with codable children: Secondary | ICD-10-CM | POA: Insufficient documentation

## 2012-01-12 DIAGNOSIS — M75100 Unspecified rotator cuff tear or rupture of unspecified shoulder, not specified as traumatic: Secondary | ICD-10-CM | POA: Insufficient documentation

## 2012-01-15 DIAGNOSIS — S40019A Contusion of unspecified shoulder, initial encounter: Secondary | ICD-10-CM | POA: Diagnosis not present

## 2012-05-17 ENCOUNTER — Other Ambulatory Visit (HOSPITAL_COMMUNITY): Payer: Self-pay | Admitting: Pulmonary Disease

## 2012-05-17 DIAGNOSIS — Z139 Encounter for screening, unspecified: Secondary | ICD-10-CM

## 2012-05-23 ENCOUNTER — Ambulatory Visit (HOSPITAL_COMMUNITY): Payer: Medicare Other

## 2012-06-27 ENCOUNTER — Ambulatory Visit (HOSPITAL_COMMUNITY): Payer: Medicare Other

## 2012-08-15 DIAGNOSIS — E785 Hyperlipidemia, unspecified: Secondary | ICD-10-CM | POA: Diagnosis not present

## 2012-08-15 DIAGNOSIS — F411 Generalized anxiety disorder: Secondary | ICD-10-CM | POA: Diagnosis not present

## 2012-08-15 DIAGNOSIS — M1990 Unspecified osteoarthritis, unspecified site: Secondary | ICD-10-CM | POA: Diagnosis not present

## 2012-08-15 DIAGNOSIS — E109 Type 1 diabetes mellitus without complications: Secondary | ICD-10-CM | POA: Diagnosis not present

## 2012-10-24 ENCOUNTER — Ambulatory Visit (HOSPITAL_COMMUNITY): Payer: Medicare Other

## 2012-10-28 ENCOUNTER — Ambulatory Visit (HOSPITAL_COMMUNITY)
Admission: RE | Admit: 2012-10-28 | Discharge: 2012-10-28 | Disposition: A | Discharge status: Home / Self Care | Payer: Medicare Other | Source: Ambulatory Visit | Attending: Pulmonary Disease | Admitting: Pulmonary Disease

## 2012-10-28 DIAGNOSIS — Z1231 Encounter for screening mammogram for malignant neoplasm of breast: Secondary | ICD-10-CM | POA: Diagnosis not present

## 2012-10-28 DIAGNOSIS — Z139 Encounter for screening, unspecified: Secondary | ICD-10-CM

## 2013-01-27 DIAGNOSIS — F411 Generalized anxiety disorder: Secondary | ICD-10-CM | POA: Diagnosis not present

## 2013-01-27 DIAGNOSIS — M1990 Unspecified osteoarthritis, unspecified site: Secondary | ICD-10-CM | POA: Diagnosis not present

## 2013-01-27 DIAGNOSIS — E785 Hyperlipidemia, unspecified: Secondary | ICD-10-CM | POA: Diagnosis not present

## 2013-01-27 DIAGNOSIS — I10 Essential (primary) hypertension: Secondary | ICD-10-CM | POA: Diagnosis not present

## 2013-01-27 DIAGNOSIS — Z23 Encounter for immunization: Secondary | ICD-10-CM | POA: Diagnosis not present

## 2013-01-27 DIAGNOSIS — E109 Type 1 diabetes mellitus without complications: Secondary | ICD-10-CM | POA: Diagnosis not present

## 2013-06-26 ENCOUNTER — Other Ambulatory Visit: Payer: Self-pay | Admitting: Obstetrics and Gynecology

## 2013-07-23 DIAGNOSIS — E109 Type 1 diabetes mellitus without complications: Secondary | ICD-10-CM | POA: Diagnosis not present

## 2013-07-23 DIAGNOSIS — F411 Generalized anxiety disorder: Secondary | ICD-10-CM | POA: Diagnosis not present

## 2013-07-23 DIAGNOSIS — Z79899 Other long term (current) drug therapy: Secondary | ICD-10-CM | POA: Diagnosis not present

## 2013-07-23 DIAGNOSIS — I10 Essential (primary) hypertension: Secondary | ICD-10-CM | POA: Diagnosis not present

## 2013-07-23 DIAGNOSIS — Z5181 Encounter for therapeutic drug level monitoring: Secondary | ICD-10-CM | POA: Diagnosis not present

## 2013-08-13 ENCOUNTER — Other Ambulatory Visit: Payer: Self-pay | Admitting: Obstetrics and Gynecology

## 2013-08-20 ENCOUNTER — Other Ambulatory Visit: Payer: Self-pay | Admitting: Obstetrics and Gynecology

## 2013-08-25 DIAGNOSIS — M1990 Unspecified osteoarthritis, unspecified site: Secondary | ICD-10-CM | POA: Diagnosis not present

## 2013-08-25 DIAGNOSIS — I10 Essential (primary) hypertension: Secondary | ICD-10-CM | POA: Diagnosis not present

## 2013-08-25 DIAGNOSIS — F411 Generalized anxiety disorder: Secondary | ICD-10-CM | POA: Diagnosis not present

## 2013-08-25 DIAGNOSIS — E109 Type 1 diabetes mellitus without complications: Secondary | ICD-10-CM | POA: Diagnosis not present

## 2013-10-31 DIAGNOSIS — M545 Low back pain, unspecified: Secondary | ICD-10-CM | POA: Diagnosis not present

## 2013-11-03 ENCOUNTER — Ambulatory Visit (HOSPITAL_COMMUNITY)
Admission: RE | Admit: 2013-11-03 | Discharge: 2013-11-03 | Disposition: A | Discharge status: Home / Self Care | Payer: Medicare Other | Source: Ambulatory Visit | Attending: Pulmonary Disease | Admitting: Pulmonary Disease

## 2013-11-03 ENCOUNTER — Other Ambulatory Visit (HOSPITAL_COMMUNITY): Payer: Self-pay | Admitting: Pulmonary Disease

## 2013-11-03 DIAGNOSIS — M545 Low back pain, unspecified: Secondary | ICD-10-CM | POA: Insufficient documentation

## 2013-11-03 DIAGNOSIS — IMO0002 Reserved for concepts with insufficient information to code with codable children: Secondary | ICD-10-CM | POA: Diagnosis not present

## 2013-11-03 DIAGNOSIS — M4120 Other idiopathic scoliosis, site unspecified: Secondary | ICD-10-CM | POA: Diagnosis not present

## 2013-11-03 DIAGNOSIS — M549 Dorsalgia, unspecified: Secondary | ICD-10-CM | POA: Diagnosis not present

## 2013-11-10 ENCOUNTER — Other Ambulatory Visit (HOSPITAL_COMMUNITY): Payer: Self-pay | Admitting: Pulmonary Disease

## 2013-11-10 DIAGNOSIS — M545 Low back pain: Secondary | ICD-10-CM

## 2013-11-13 ENCOUNTER — Ambulatory Visit (HOSPITAL_COMMUNITY): Payer: Medicare Other

## 2013-11-14 ENCOUNTER — Other Ambulatory Visit (HOSPITAL_COMMUNITY): Payer: Medicare Other

## 2013-11-17 ENCOUNTER — Ambulatory Visit (HOSPITAL_COMMUNITY)
Admission: RE | Admit: 2013-11-17 | Discharge: 2013-11-17 | Disposition: A | Discharge status: Home / Self Care | Payer: No Typology Code available for payment source | Source: Ambulatory Visit | Attending: Pulmonary Disease | Admitting: Pulmonary Disease

## 2013-11-17 DIAGNOSIS — M47817 Spondylosis without myelopathy or radiculopathy, lumbosacral region: Secondary | ICD-10-CM | POA: Diagnosis not present

## 2013-11-17 DIAGNOSIS — IMO0002 Reserved for concepts with insufficient information to code with codable children: Secondary | ICD-10-CM | POA: Diagnosis not present

## 2013-11-17 DIAGNOSIS — M545 Low back pain, unspecified: Secondary | ICD-10-CM | POA: Diagnosis not present

## 2013-12-25 ENCOUNTER — Other Ambulatory Visit (HOSPITAL_COMMUNITY): Payer: Self-pay | Admitting: Pulmonary Disease

## 2013-12-25 DIAGNOSIS — Z1231 Encounter for screening mammogram for malignant neoplasm of breast: Secondary | ICD-10-CM

## 2013-12-25 DIAGNOSIS — Z139 Encounter for screening, unspecified: Secondary | ICD-10-CM

## 2013-12-31 ENCOUNTER — Ambulatory Visit (HOSPITAL_COMMUNITY)
Admission: RE | Admit: 2013-12-31 | Discharge: 2013-12-31 | Disposition: A | Discharge status: Home / Self Care | Payer: Medicare Other | Source: Ambulatory Visit | Attending: Pulmonary Disease | Admitting: Pulmonary Disease

## 2013-12-31 DIAGNOSIS — Z1231 Encounter for screening mammogram for malignant neoplasm of breast: Secondary | ICD-10-CM | POA: Insufficient documentation

## 2014-01-20 ENCOUNTER — Other Ambulatory Visit: Payer: Self-pay | Admitting: Pulmonary Disease

## 2014-01-20 DIAGNOSIS — R928 Other abnormal and inconclusive findings on diagnostic imaging of breast: Secondary | ICD-10-CM

## 2014-02-17 ENCOUNTER — Encounter (HOSPITAL_COMMUNITY): Payer: Medicare Other

## 2014-03-03 ENCOUNTER — Ambulatory Visit (HOSPITAL_COMMUNITY)
Admission: RE | Admit: 2014-03-03 | Discharge: 2014-03-03 | Disposition: A | Discharge status: Home / Self Care | Payer: Medicare Other | Source: Ambulatory Visit | Attending: Pulmonary Disease | Admitting: Pulmonary Disease

## 2014-03-03 DIAGNOSIS — R928 Other abnormal and inconclusive findings on diagnostic imaging of breast: Secondary | ICD-10-CM

## 2014-03-03 DIAGNOSIS — R921 Mammographic calcification found on diagnostic imaging of breast: Secondary | ICD-10-CM | POA: Diagnosis not present

## 2014-05-19 DIAGNOSIS — F419 Anxiety disorder, unspecified: Secondary | ICD-10-CM | POA: Diagnosis not present

## 2014-05-19 DIAGNOSIS — I10 Essential (primary) hypertension: Secondary | ICD-10-CM | POA: Diagnosis not present

## 2014-05-19 DIAGNOSIS — M1990 Unspecified osteoarthritis, unspecified site: Secondary | ICD-10-CM | POA: Diagnosis not present

## 2014-05-19 DIAGNOSIS — E1165 Type 2 diabetes mellitus with hyperglycemia: Secondary | ICD-10-CM | POA: Diagnosis not present

## 2014-06-04 DIAGNOSIS — F419 Anxiety disorder, unspecified: Secondary | ICD-10-CM | POA: Diagnosis not present

## 2014-06-04 DIAGNOSIS — M1990 Unspecified osteoarthritis, unspecified site: Secondary | ICD-10-CM | POA: Diagnosis not present

## 2014-06-04 DIAGNOSIS — Z79891 Long term (current) use of opiate analgesic: Secondary | ICD-10-CM | POA: Diagnosis not present

## 2014-06-04 DIAGNOSIS — I10 Essential (primary) hypertension: Secondary | ICD-10-CM | POA: Diagnosis not present

## 2014-06-04 DIAGNOSIS — E1165 Type 2 diabetes mellitus with hyperglycemia: Secondary | ICD-10-CM | POA: Diagnosis not present

## 2014-11-12 ENCOUNTER — Other Ambulatory Visit (HOSPITAL_COMMUNITY): Payer: Self-pay | Admitting: Pulmonary Disease

## 2014-11-12 DIAGNOSIS — R921 Mammographic calcification found on diagnostic imaging of breast: Secondary | ICD-10-CM

## 2014-11-12 DIAGNOSIS — Z09 Encounter for follow-up examination after completed treatment for conditions other than malignant neoplasm: Secondary | ICD-10-CM

## 2014-11-24 ENCOUNTER — Encounter (HOSPITAL_COMMUNITY): Payer: Medicare Other

## 2014-12-01 ENCOUNTER — Other Ambulatory Visit (HOSPITAL_COMMUNITY): Payer: Self-pay | Admitting: Pulmonary Disease

## 2014-12-01 ENCOUNTER — Ambulatory Visit (HOSPITAL_COMMUNITY)
Admission: RE | Admit: 2014-12-01 | Discharge: 2014-12-01 | Disposition: A | Discharge status: Home / Self Care | Payer: Medicare Other | Source: Ambulatory Visit | Attending: Pulmonary Disease | Admitting: Pulmonary Disease

## 2014-12-01 DIAGNOSIS — R921 Mammographic calcification found on diagnostic imaging of breast: Secondary | ICD-10-CM

## 2014-12-01 DIAGNOSIS — Z09 Encounter for follow-up examination after completed treatment for conditions other than malignant neoplasm: Secondary | ICD-10-CM

## 2014-12-07 DIAGNOSIS — Z79891 Long term (current) use of opiate analgesic: Secondary | ICD-10-CM | POA: Diagnosis not present

## 2015-01-27 DIAGNOSIS — Z23 Encounter for immunization: Secondary | ICD-10-CM | POA: Diagnosis not present

## 2015-01-27 DIAGNOSIS — E119 Type 2 diabetes mellitus without complications: Secondary | ICD-10-CM | POA: Diagnosis not present

## 2015-01-27 DIAGNOSIS — M1990 Unspecified osteoarthritis, unspecified site: Secondary | ICD-10-CM | POA: Diagnosis not present

## 2015-01-27 DIAGNOSIS — I10 Essential (primary) hypertension: Secondary | ICD-10-CM | POA: Diagnosis not present

## 2015-01-27 DIAGNOSIS — F419 Anxiety disorder, unspecified: Secondary | ICD-10-CM | POA: Diagnosis not present

## 2015-06-11 DIAGNOSIS — E119 Type 2 diabetes mellitus without complications: Secondary | ICD-10-CM | POA: Diagnosis not present

## 2015-06-11 DIAGNOSIS — J209 Acute bronchitis, unspecified: Secondary | ICD-10-CM | POA: Diagnosis not present

## 2015-08-10 ENCOUNTER — Other Ambulatory Visit: Payer: Self-pay | Admitting: Obstetrics and Gynecology

## 2015-08-18 ENCOUNTER — Other Ambulatory Visit: Payer: Self-pay | Admitting: Obstetrics and Gynecology

## 2015-08-18 DIAGNOSIS — Z79891 Long term (current) use of opiate analgesic: Secondary | ICD-10-CM | POA: Diagnosis not present

## 2015-08-26 ENCOUNTER — Other Ambulatory Visit: Payer: Self-pay | Admitting: Obstetrics and Gynecology

## 2015-08-31 ENCOUNTER — Other Ambulatory Visit: Payer: Self-pay | Admitting: Obstetrics and Gynecology

## 2015-09-08 ENCOUNTER — Other Ambulatory Visit: Payer: Self-pay | Admitting: Obstetrics and Gynecology

## 2015-09-21 ENCOUNTER — Encounter: Payer: Self-pay | Admitting: Obstetrics and Gynecology

## 2015-09-21 ENCOUNTER — Other Ambulatory Visit: Payer: Self-pay | Admitting: Obstetrics and Gynecology

## 2015-09-21 DIAGNOSIS — E119 Type 2 diabetes mellitus without complications: Secondary | ICD-10-CM | POA: Diagnosis not present

## 2015-11-02 DIAGNOSIS — M1990 Unspecified osteoarthritis, unspecified site: Secondary | ICD-10-CM | POA: Diagnosis not present

## 2015-11-02 DIAGNOSIS — L989 Disorder of the skin and subcutaneous tissue, unspecified: Secondary | ICD-10-CM | POA: Diagnosis not present

## 2015-11-02 DIAGNOSIS — E1165 Type 2 diabetes mellitus with hyperglycemia: Secondary | ICD-10-CM | POA: Diagnosis not present

## 2015-11-02 DIAGNOSIS — I10 Essential (primary) hypertension: Secondary | ICD-10-CM | POA: Diagnosis not present

## 2016-07-05 DIAGNOSIS — I10 Essential (primary) hypertension: Secondary | ICD-10-CM | POA: Diagnosis not present

## 2016-07-05 DIAGNOSIS — F322 Major depressive disorder, single episode, severe without psychotic features: Secondary | ICD-10-CM | POA: Diagnosis not present

## 2016-07-05 DIAGNOSIS — F419 Anxiety disorder, unspecified: Secondary | ICD-10-CM | POA: Diagnosis not present

## 2016-07-05 DIAGNOSIS — E119 Type 2 diabetes mellitus without complications: Secondary | ICD-10-CM | POA: Diagnosis not present

## 2016-07-06 DIAGNOSIS — Z79891 Long term (current) use of opiate analgesic: Secondary | ICD-10-CM | POA: Diagnosis not present

## 2016-07-06 DIAGNOSIS — I10 Essential (primary) hypertension: Secondary | ICD-10-CM | POA: Diagnosis not present

## 2016-07-06 DIAGNOSIS — F322 Major depressive disorder, single episode, severe without psychotic features: Secondary | ICD-10-CM | POA: Diagnosis not present

## 2016-07-06 DIAGNOSIS — F419 Anxiety disorder, unspecified: Secondary | ICD-10-CM | POA: Diagnosis not present

## 2016-07-06 DIAGNOSIS — E119 Type 2 diabetes mellitus without complications: Secondary | ICD-10-CM | POA: Diagnosis not present

## 2016-10-12 ENCOUNTER — Other Ambulatory Visit (HOSPITAL_COMMUNITY): Payer: Self-pay | Admitting: Pulmonary Disease

## 2016-10-12 DIAGNOSIS — Z1231 Encounter for screening mammogram for malignant neoplasm of breast: Secondary | ICD-10-CM

## 2016-10-13 ENCOUNTER — Other Ambulatory Visit (HOSPITAL_COMMUNITY): Payer: Self-pay | Admitting: Pulmonary Disease

## 2016-10-13 DIAGNOSIS — R921 Mammographic calcification found on diagnostic imaging of breast: Secondary | ICD-10-CM

## 2016-10-24 ENCOUNTER — Encounter (HOSPITAL_COMMUNITY): Payer: Medicare Other

## 2016-11-07 ENCOUNTER — Ambulatory Visit (HOSPITAL_COMMUNITY)
Admission: RE | Admit: 2016-11-07 | Discharge: 2016-11-07 | Disposition: A | Discharge status: Home / Self Care | Payer: Medicare Other | Source: Ambulatory Visit | Attending: Pulmonary Disease | Admitting: Pulmonary Disease

## 2016-11-07 DIAGNOSIS — R921 Mammographic calcification found on diagnostic imaging of breast: Secondary | ICD-10-CM | POA: Diagnosis not present

## 2016-12-13 ENCOUNTER — Other Ambulatory Visit (HOSPITAL_COMMUNITY): Payer: Self-pay | Admitting: Pulmonary Disease

## 2016-12-13 DIAGNOSIS — F419 Anxiety disorder, unspecified: Secondary | ICD-10-CM | POA: Diagnosis not present

## 2016-12-13 DIAGNOSIS — E785 Hyperlipidemia, unspecified: Secondary | ICD-10-CM | POA: Diagnosis not present

## 2016-12-13 DIAGNOSIS — I10 Essential (primary) hypertension: Secondary | ICD-10-CM | POA: Diagnosis not present

## 2016-12-13 DIAGNOSIS — E119 Type 2 diabetes mellitus without complications: Secondary | ICD-10-CM | POA: Diagnosis not present

## 2016-12-13 DIAGNOSIS — M545 Low back pain: Secondary | ICD-10-CM

## 2016-12-20 ENCOUNTER — Ambulatory Visit (HOSPITAL_COMMUNITY): Payer: Medicare Other

## 2016-12-28 ENCOUNTER — Ambulatory Visit (HOSPITAL_COMMUNITY): Payer: Medicare Other

## 2017-01-04 ENCOUNTER — Ambulatory Visit (HOSPITAL_COMMUNITY)
Admission: RE | Admit: 2017-01-04 | Discharge: 2017-01-04 | Disposition: A | Discharge status: Home / Self Care | Payer: Medicare Other | Source: Ambulatory Visit | Attending: Pulmonary Disease | Admitting: Pulmonary Disease

## 2017-01-04 ENCOUNTER — Encounter (HOSPITAL_COMMUNITY): Payer: Self-pay

## 2017-01-11 ENCOUNTER — Ambulatory Visit (HOSPITAL_COMMUNITY): Payer: Medicare Other

## 2017-05-22 DIAGNOSIS — I10 Essential (primary) hypertension: Secondary | ICD-10-CM | POA: Diagnosis not present

## 2017-05-22 DIAGNOSIS — M545 Low back pain: Secondary | ICD-10-CM | POA: Diagnosis not present

## 2017-05-22 DIAGNOSIS — E1165 Type 2 diabetes mellitus with hyperglycemia: Secondary | ICD-10-CM | POA: Diagnosis not present

## 2017-05-22 DIAGNOSIS — F419 Anxiety disorder, unspecified: Secondary | ICD-10-CM | POA: Diagnosis not present

## 2017-05-31 ENCOUNTER — Encounter (INDEPENDENT_AMBULATORY_CARE_PROVIDER_SITE_OTHER): Payer: Self-pay | Admitting: *Deleted

## 2017-07-23 ENCOUNTER — Encounter: Payer: Self-pay | Admitting: Obstetrics and Gynecology

## 2017-07-23 ENCOUNTER — Encounter (INDEPENDENT_AMBULATORY_CARE_PROVIDER_SITE_OTHER): Payer: Self-pay

## 2017-07-23 ENCOUNTER — Ambulatory Visit: Payer: Medicare HMO | Admitting: Obstetrics and Gynecology

## 2017-07-23 VITALS — BP 140/80 | Ht 69.0 in | Wt 203.6 lb

## 2017-07-23 DIAGNOSIS — Z01411 Encounter for gynecological examination (general) (routine) with abnormal findings: Secondary | ICD-10-CM | POA: Diagnosis not present

## 2017-07-23 DIAGNOSIS — Z01419 Encounter for gynecological examination (general) (routine) without abnormal findings: Secondary | ICD-10-CM

## 2017-07-23 DIAGNOSIS — K5903 Drug induced constipation: Secondary | ICD-10-CM | POA: Diagnosis not present

## 2017-07-23 DIAGNOSIS — R232 Flushing: Secondary | ICD-10-CM | POA: Diagnosis not present

## 2017-07-23 MED ORDER — VENLAFAXINE HCL ER 37.5 MG PO CP24: 75.0000 mg | ORAL_CAPSULE | Freq: Every day | ORAL | 5 refills | Status: DC

## 2017-07-23 NOTE — Progress Notes (Signed)
Assessment:  1. Annual Gyn Exam 2. Hot sweats  Plan:  1. pap smear done, next pap due 3 years 2. return annually or prn 3    Annual mammogram advised after age 67 4    Rx Effexor 30 tablets 5    F/u in 2 months  Subjective:  Melissa Tucker is a 67 y.o. female No obstetric history on file. who presents for annual exam. No LMP recorded. Patient has had a hysterectomy. She still has her ovaries. The patient has been having hot sweats recently. No associated symptoms noted. She has not tried any medications. No alleviating factors noted.   She is on a medication that causes her constipation. Her mom had a hx of cervical cancer. She had a mammogram 6 months ago.  The following portions of the patient's history were reviewed and updated as appropriate: allergies, current medications, past family history, past medical history, past social history, past surgical history and problem list. Past Medical History:  Diagnosis Date  . Anxiety and depression   . Chest tightness   . Diabetes mellitus 1994   No insulin  . DJD (degenerative joint disease)   . History of DVT (deep vein thrombosis) 1989   Following childbirth  . Hyperlipidemia   . Hypertension   . IBS (irritable bowel syndrome)     Past Surgical History:  Procedure Laterality Date  . ABDOMINAL HYSTERECTOMY    . CESAREAN SECTION  1988  . CHOLECYSTECTOMY    . COLONOSCOPY  2005   Normal findings  . SHOULDER SURGERY     Left shoulder manipulation with subcrominal decompression, capsulitis  . TOTAL KNEE ARTHROPLASTY  2003   Left  . TOTAL KNEE ARTHROPLASTY  2007   Right     Current Outpatient Medications:  .  ALPRAZolam (XANAX) 0.5 MG tablet, Take 0.5 mg by mouth 3 (three) times daily.  , Disp: , Rfl:  .  aspirin 325 MG EC tablet, Take 325 mg by mouth daily.  , Disp: , Rfl:  .  dicyclomine (BENTYL) 10 MG capsule, Take 10 mg by mouth daily.  , Disp: , Rfl:  .  furosemide (LASIX) 40 MG tablet, Take 40 mg by mouth daily.  ,  Disp: , Rfl:  .  glyBURIDE-metformin (GLUCOVANCE) 5-500 MG per tablet, Take 2 tablets by mouth 2 (two) times daily.  , Disp: , Rfl:  .  Hydrocodone-Acetaminophen 10-660 MG TABS, Take by mouth as needed.  , Disp: , Rfl:  .  lisinopril (PRINIVIL,ZESTRIL) 40 MG tablet, Take 40 mg by mouth daily.  , Disp: , Rfl:  .  INDOMETHACIN PO, Take by mouth., Disp: , Rfl:  .  ROBAXIN 500 MG tablet, TAKE (1) TABLET BY MOUTH (4) TIMES DAILY., Disp: 605 each, Rfl: 5 .  simvastatin (ZOCOR) 40 MG tablet, Take 40 mg by mouth at bedtime.  , Disp: , Rfl:   Review of Systems Constitutional: negative Gastrointestinal: negative Genitourinary: negative  Objective:  BP 140/80 (BP Location: Right Arm, Patient Position: Sitting, Cuff Size: Small)   Ht 5\' 9"  (1.753 m)   Wt 203 lb 9.6 oz (92.4 kg)   BMI 30.07 kg/m    BMI: Body mass index is 30.07 kg/m.  General Appearance: Alert, appropriate appearance for age. No acute distress HEENT: Grossly normal Neck / Thyroid:  Cardiovascular: RRR; normal S1, S2, no murmur Lungs:  Back: Breast Exam: No masses or nodes.No dimpling, nipple retraction or discharge. Gastrointestinal: Soft, non-tender, no masses or organomegaly  Pelvic Exam:  External genitalia: normal general appearance Vaginal: normal mucosa without prolapse or lesions Cervix: absent Adnexa: normal bimanual exam Uterus: removed surgically Rectovaginal: normal rectal, no masses and guaiac negative stool obtained  Lymphatic Exam: Non-palpable nodes in neck, clavicular, axillary, or inguinal regions  Skin: no rash or abnormalities Neurologic: Normal gait and speech, no tremor  Psychiatric: Alert and oriented, appropriate affect.  Urinalysis:Not done   Discussion: 1. Discussed with pt current information, studies and literature available on risks and benefits of hormone therapy, specifically in regards to Effexor.   At end of discussion, pt had opportunity to ask questions and has no further questions  at this time.   Specific discussion of hormone therapy as noted above. Greater than 50% was spent in counseling and coordination of care with the patient.   Total time greater than: 15 minutes.      By signing my name below, I, Izna Ahmed, attest that this documentation has been prepared under the direction and in the presence of Tilda Burrow., MD. Electronically Signed: Redge Gainer, Medical Scribe. 07/23/17. 11:54 AM.  I personally performed the services described in this documentation, which was SCRIBED in my presence. The recorded information has been reviewed and considered accurate. It has been edited as necessary during review. Tilda Burrow, MD

## 2017-07-23 NOTE — Patient Instructions (Signed)
Menopause Menopause is the normal time of life when menstrual periods stop completely. Menopause is complete when you have missed 12 consecutive menstrual periods. It usually occurs between the ages of 48 years and 55 years. Very rarely does a woman develop menopause before the age of 40 years. At menopause, your ovaries stop producing the female hormones estrogen and progesterone. This can cause undesirable symptoms and also affect your health. Sometimes the symptoms may occur 4-5 years before the menopause begins. There is no relationship between menopause and:  Oral contraceptives.  Number of children you had.  Race.  The age your menstrual periods started (menarche).  Heavy smokers and very thin women may develop menopause earlier in life. What are the causes?  The ovaries stop producing the female hormones estrogen and progesterone. Other causes include:  Surgery to remove both ovaries.  The ovaries stop functioning for no known reason.  Tumors of the pituitary gland in the brain.  Medical disease that affects the ovaries and hormone production.  Radiation treatment to the abdomen or pelvis.  Chemotherapy that affects the ovaries.  What are the signs or symptoms?  Hot flashes.  Night sweats.  Decrease in sex drive.  Vaginal dryness and thinning of the vagina causing painful intercourse.  Dryness of the skin and developing wrinkles.  Headaches.  Tiredness.  Irritability.  Memory problems.  Weight gain.  Bladder infections.  Hair growth of the face and chest.  Infertility. More serious symptoms include:  Loss of bone (osteoporosis) causing breaks (fractures).  Depression.  Hardening and narrowing of the arteries (atherosclerosis) causing heart attacks and strokes.  How is this diagnosed?  When the menstrual periods have stopped for 12 straight months.  Physical exam.  Hormone studies of the blood. How is this treated? There are many treatment  choices and nearly as many questions about them. The decisions to treat or not to treat menopausal changes is an individual choice made with your health care provider. Your health care provider can discuss the treatments with you. Together, you can decide which treatment will work best for you. Your treatment choices may include:  Hormone therapy (estrogen and progesterone).  Non-hormonal medicines.  Treating the individual symptoms with medicine (for example antidepressants for depression).  Herbal medicines that may help specific symptoms.  Counseling by a psychiatrist or psychologist.  Group therapy.  Lifestyle changes including: ? Eating healthy. ? Regular exercise. ? Limiting caffeine and alcohol. ? Stress management and meditation.  No treatment.  Follow these instructions at home:  Take the medicine your health care provider gives you as directed.  Get plenty of sleep and rest.  Exercise regularly.  Eat a diet that contains calcium (good for the bones) and soy products (acts like estrogen hormone).  Avoid alcoholic beverages.  Do not smoke.  If you have hot flashes, dress in layers.  Take supplements, calcium, and vitamin D to strengthen bones.  You can use over-the-counter lubricants or moisturizers for vaginal dryness.  Group therapy is sometimes very helpful.  Acupuncture may be helpful in some cases. Contact a health care provider if:  You are not sure you are in menopause.  You are having menopausal symptoms and need advice and treatment.  You are still having menstrual periods after age 55 years.  You have pain with intercourse.  Menopause is complete (no menstrual period for 12 months) and you develop vaginal bleeding.  You need a referral to a specialist (gynecologist, psychiatrist, or psychologist) for treatment. Get help right   away if:  You have severe depression.  You have excessive vaginal bleeding.  You fell and think you have a  broken bone.  You have pain when you urinate.  You develop leg or chest pain.  You have a fast pounding heart beat (palpitations).  You have severe headaches.  You develop vision problems.  You feel a lump in your breast.  You have abdominal pain or severe indigestion. This information is not intended to replace advice given to you by your health care provider. Make sure you discuss any questions you have with your health care provider. Document Released: 06/17/2003 Document Revised: 09/02/2015 Document Reviewed: 10/24/2012 Elsevier Interactive Patient Education  2017 [employer]vier Inc[employer]  

## 2017-09-07 ENCOUNTER — Encounter: Payer: Self-pay | Admitting: Pulmonary Disease

## 2017-09-07 DIAGNOSIS — M545 Low back pain: Secondary | ICD-10-CM | POA: Diagnosis not present

## 2017-09-07 DIAGNOSIS — I10 Essential (primary) hypertension: Secondary | ICD-10-CM | POA: Diagnosis not present

## 2017-09-07 DIAGNOSIS — F419 Anxiety disorder, unspecified: Secondary | ICD-10-CM | POA: Diagnosis not present

## 2017-09-07 DIAGNOSIS — E1165 Type 2 diabetes mellitus with hyperglycemia: Secondary | ICD-10-CM | POA: Diagnosis not present

## 2017-09-07 LAB — BASIC METABOLIC PANEL
BUN: 11 (ref 4–21)
Creatinine: 0.6 (ref <=1.1)

## 2017-09-07 LAB — LIPID PANEL
Cholesterol: 136 (ref 0–200)
HDL: 51 (ref 35–70)
LDL Cholesterol: 65
Triglycerides: 116 (ref 40–160)

## 2017-09-07 LAB — HEMOGLOBIN A1C: Hemoglobin A1C: 9.6

## 2017-09-24 ENCOUNTER — Encounter: Payer: Self-pay | Admitting: *Deleted

## 2017-09-24 ENCOUNTER — Ambulatory Visit: Payer: Medicare HMO | Admitting: Obstetrics and Gynecology

## 2017-10-09 DIAGNOSIS — Z79891 Long term (current) use of opiate analgesic: Secondary | ICD-10-CM | POA: Diagnosis not present

## 2017-11-06 ENCOUNTER — Ambulatory Visit (INDEPENDENT_AMBULATORY_CARE_PROVIDER_SITE_OTHER): Payer: Medicare HMO | Admitting: "Endocrinology

## 2017-11-06 ENCOUNTER — Encounter: Payer: Self-pay | Admitting: "Endocrinology

## 2017-11-06 VITALS — BP 151/79 | HR 86 | Ht 67.72 in | Wt 203.0 lb

## 2017-11-06 DIAGNOSIS — E6609 Other obesity due to excess calories: Secondary | ICD-10-CM

## 2017-11-06 DIAGNOSIS — E782 Mixed hyperlipidemia: Secondary | ICD-10-CM

## 2017-11-06 DIAGNOSIS — E1165 Type 2 diabetes mellitus with hyperglycemia: Secondary | ICD-10-CM

## 2017-11-06 DIAGNOSIS — Z6831 Body mass index (BMI) 31.0-31.9, adult: Secondary | ICD-10-CM | POA: Diagnosis not present

## 2017-11-06 DIAGNOSIS — I10 Essential (primary) hypertension: Secondary | ICD-10-CM

## 2017-11-06 MED ORDER — METFORMIN HCL 500 MG PO TABS: 500.0000 mg | ORAL_TABLET | Freq: Two times a day (BID) | ORAL | 2 refills | Status: DC

## 2017-11-06 NOTE — Progress Notes (Signed)
Endocrinology Consult Note       11/06/2017, 5:37 PM   Subjective:    Patient ID: Melissa JerichoLinda F Goyer, female    DOB: 01-03-1951.  Melissa JerichoLinda F Garduno is being seen in consultation for management of currently uncontrolled symptomatic diabetes requested by  Kari BaarsHawkins, Edward, MD.   Past Medical History:  Diagnosis Date  . Anxiety and depression   . Chest tightness   . Diabetes mellitus 1994   No insulin  . DJD (degenerative joint disease)   . History of DVT (deep vein thrombosis) 1989   Following childbirth  . Hyperlipidemia   . Hypertension   . IBS (irritable bowel syndrome)    Past Surgical History:  Procedure Laterality Date  . ABDOMINAL HYSTERECTOMY    . CESAREAN SECTION  1988  . CHOLECYSTECTOMY    . COLONOSCOPY  2005   Normal findings  . SHOULDER SURGERY     Left shoulder manipulation with subcrominal decompression, capsulitis  . TOTAL KNEE ARTHROPLASTY  2003   Left  . TOTAL KNEE ARTHROPLASTY  2007   Right   Social History   Socioeconomic History  . Marital status: Divorced    Spouse name: Not on file  . Number of children: Not on file  . Years of education: Not on file  . Highest education level: Not on file  Occupational History  . Occupation: disabled    Associate Professormployer: UNEMPLOYED  Social Needs  . Financial resource strain: Not on file  . Food insecurity:    Worry: Not on file    Inability: Not on file  . Transportation needs:    Medical: Not on file    Non-medical: Not on file  Tobacco Use  . Smoking status: Former Smoker    Last attempt to quit: 04/10/1988    Years since quitting: 29.5  . Smokeless tobacco: Former NeurosurgeonUser  . Tobacco comment: Minimal prior use  Substance and Sexual Activity  . Alcohol use: No  . Drug use: No  . Sexual activity: Yes  Lifestyle  . Physical activity:    Days per week: Not on file    Minutes per session: Not on file  . Stress: Not on file  Relationships   . Social connections:    Talks on phone: Not on file    Gets together: Not on file    Attends religious service: Not on file    Active member of club or organization: Not on file    Attends meetings of clubs or organizations: Not on file    Relationship status: Not on file  Other Topics Concern  . Not on file  Social History Narrative   Divorced   Resides with daughter, also has 2 sons   No regular exercise   Outpatient Encounter Medications as of 11/06/2017  Medication Sig  . ALPRAZolam (XANAX) 0.5 MG tablet Take 0.5 mg by mouth 3 (three) times daily.    Marland Kitchen. aspirin 325 MG EC tablet Take 325 mg by mouth daily.    Marland Kitchen. dicyclomine (BENTYL) 10 MG capsule Take 10 mg by mouth daily.    . furosemide (LASIX) 40 MG tablet Take 40  mg by mouth daily.    . Hydrocodone-Acetaminophen 10-660 MG TABS Take by mouth as needed.    . INDOMETHACIN PO Take by mouth.  Marland Kitchen lisinopril (PRINIVIL,ZESTRIL) 40 MG tablet Take 40 mg by mouth daily.    . simvastatin (ZOCOR) 40 MG tablet Take 40 mg by mouth at bedtime.    Marland Kitchen venlafaxine XR (EFFEXOR-XR) 37.5 MG 24 hr capsule Take 2 capsules (75 mg total) by mouth daily.  . [DISCONTINUED] canagliflozin (INVOKANA) 300 MG TABS tablet Take 300 mg by mouth daily before breakfast.  . [DISCONTINUED] glyBURIDE-metformin (GLUCOVANCE) 5-500 MG per tablet Take 2 tablets by mouth 2 (two) times daily.    . metFORMIN (GLUCOPHAGE) 500 MG tablet Take 1 tablet (500 mg total) by mouth 2 (two) times daily with a meal.  . ROBAXIN 500 MG tablet TAKE (1) TABLET BY MOUTH (4) TIMES DAILY. (Patient not taking: Reported on 11/06/2017)   No facility-administered encounter medications on file as of 11/06/2017.     ALLERGIES: Allergies  Allergen Reactions  . Peanut-Containing Drug Products Swelling    VACCINATION STATUS:  There is no immunization history on file for this patient.  Diabetes  She presents for her initial diabetic visit. She has type 2 diabetes mellitus. Onset time: She was  diagnosed at approximate age of 41 years. Her disease course has been worsening. There are no hypoglycemic associated symptoms. Pertinent negatives for hypoglycemia include no confusion, headaches, pallor or seizures. Associated symptoms include blurred vision, fatigue, polydipsia and polyuria. Pertinent negatives for diabetes include no chest pain and no polyphagia. There are no hypoglycemic complications. Symptoms are worsening. Diabetic complications include retinopathy. Risk factors for coronary artery disease include diabetes mellitus, dyslipidemia, family history, hypertension, obesity, sedentary lifestyle, post-menopausal and tobacco exposure. Current diabetic treatments: She is currently on Invokana 300 mg p.o. daily metformin/glipizide,\ Her weight is decreasing steadily. She is following a generally unhealthy diet. When asked about meal planning, she reported none. She has not had a previous visit with a dietitian. She never participates in exercise. (She did not bring any meter nor logs to review today.  Her most recent A1c was 9.6%.) An ACE inhibitor/angiotensin II receptor blocker is being taken. Eye exam is current.  Hyperlipidemia  This is a chronic problem. The current episode started more than 1 year ago. The problem is controlled. Exacerbating diseases include diabetes and obesity. Pertinent negatives include no chest pain, myalgias or shortness of breath. Current antihyperlipidemic treatment includes statins and ezetimibe. Risk factors for coronary artery disease include dyslipidemia, diabetes mellitus, family history, obesity, hypertension, a sedentary lifestyle and post-menopausal.  Hypertension  This is a chronic problem. The current episode started more than 1 year ago. The problem is uncontrolled. Associated symptoms include blurred vision. Pertinent negatives include no chest pain, headaches, palpitations or shortness of breath. Risk factors for coronary artery disease include diabetes  mellitus, dyslipidemia, family history, obesity, post-menopausal state, sedentary lifestyle and smoking/tobacco exposure. Past treatments include ACE inhibitors. Hypertensive end-organ damage includes retinopathy.      Review of Systems  Constitutional: Positive for fatigue. Negative for chills, fever and unexpected weight change.  HENT: Negative for trouble swallowing and voice change.   Eyes: Positive for blurred vision. Negative for visual disturbance.  Respiratory: Negative for cough, shortness of breath and wheezing.   Cardiovascular: Negative for chest pain, palpitations and leg swelling.  Gastrointestinal: Negative for diarrhea, nausea and vomiting.  Endocrine: Positive for polydipsia and polyuria. Negative for cold intolerance, heat intolerance and polyphagia.  Musculoskeletal: Negative  for arthralgias and myalgias.  Skin: Negative for color change, pallor, rash and wound.  Neurological: Negative for seizures and headaches.  Psychiatric/Behavioral: Negative for confusion and suicidal ideas.    Objective:    BP (!) 151/79   Pulse 86   Ht 5' 7.72" (1.72 m)   Wt 203 lb (92.1 kg)   SpO2 95%   BMI 31.12 kg/m   Wt Readings from Last 3 Encounters:  11/06/17 203 lb (92.1 kg)  07/23/17 203 lb 9.6 oz (92.4 kg)  08/22/11 207 lb (93.9 kg)     Physical Exam  Constitutional: She is oriented to person, place, and time. She appears well-developed.  HENT:  Head: Normocephalic and atraumatic.  Eyes: EOM are normal.  Neck: Normal range of motion. Neck supple. No tracheal deviation present. No thyromegaly present.  Cardiovascular: Normal rate and regular rhythm.  Pulmonary/Chest: Effort normal and breath sounds normal.  Abdominal: Soft. Bowel sounds are normal. There is no tenderness. There is no guarding.  Musculoskeletal: Normal range of motion. She exhibits no edema.  Neurological: She is alert and oriented to person, place, and time. She has normal reflexes. No cranial nerve  deficit. Coordination normal.  Skin: Skin is warm and dry. No rash noted. No erythema. No pallor.  Psychiatric: She has a normal mood and affect. Judgment normal.    CMP ( most recent) CMP     Component Value Date/Time   NA 138 12/03/2009 1545   K 3.9 12/03/2009 1545   CL 104 12/03/2009 1545   CO2 26 12/03/2009 1545   GLUCOSE 228 (H) 12/06/2009 0833   BUN 14 12/03/2009 1545   CREATININE 0.39 (L) 12/03/2009 1545   CALCIUM 9.9 12/03/2009 1545   PROT 7.2 09/13/2009   ALBUMIN 4.7 09/13/2009   AST 15 09/13/2009   ALT 18 09/13/2009   ALKPHOS 100 09/13/2009     Diabetic Labs (most recent): Lab Results  Component Value Date   HGBA1C 7.0 09/13/2009     Lipid Panel ( most recent) Lipid Panel     Component Value Date/Time   CHOL 126 09/13/2009   TRIG 67 09/13/2009   HDL 55 09/13/2009   LDLCALC 58 09/13/2009      Assessment & Plan:   1. Uncontrolled type 2 diabetes mellitus with hyperglycemia (HCC)  - SUEKO DIMICHELE has currently uncontrolled symptomatic type 2 DM since 67 years of age,  with most recent A1c of 9.6 %. Recent labs reviewed.  -her diabetes is complicated by retinopathy, obesity/sedentary life, history of smoking and OLIVIANNA HIGLEY remains at a high risk for more acute and chronic complications which include CAD, CVA, CKD, retinopathy, and neuropathy. These are all discussed in detail with the patient.  - I have counseled her on diet management and weight loss, by adopting a carbohydrate restricted/protein rich diet.  - Suggestion is made for her to avoid simple carbohydrates  from her diet including Cakes, Sweet Desserts, Ice Cream, Soda (diet and regular), Sweet Tea, Candies, Chips, Cookies, Store Bought Juices, Alcohol in Excess of  1-2 drinks a day, Artificial Sweeteners, and "Sugar-free" Products. This will help patient to have stable blood glucose profile and potentially avoid unintended weight gain.  - I encouraged her to switch to  unprocessed or  minimally processed complex starch and increased protein intake (animal or plant source), fruits, and vegetables.  - she is advised to stick to a routine mealtimes to eat 3 meals  a day and avoid unnecessary snacks ( to snack  only to correct hypoglycemia).   - she will be scheduled with Norm Salt, RDN, CDE for individualized diabetes education.  - I have approached her with the following individualized plan to manage diabetes and patient agrees:   -Given her prevailing glycemic burden with A1c of 9.6%, she will require insulin treatment in order for her to achieve control of diabetes to target.   -However, she is reluctant to start this treatment today.   -In preparation, I approach her to start monitoring blood glucose 4 times a day- daily before meals and at bedtime and return in 1 week with her meter and logs for reevaluation.    -Patient is encouraged to call clinic for blood glucose levels less than 70 or above 300 mg /dl. - I we will proceed to discontinue her Invokana, and Glucovance.   -I will proceed with metformin 500 mg p.o. twice daily therapeutically acceptable and suitable for her.   - she will be considered for incretin therapy as appropriate next visit. - Patient specific target  A1c;  LDL, HDL, Triglycerides, and  Waist Circumference were discussed in detail.  2) BP/HTN: Her blood pressure is not controlled to target.  I advised her to continue her current medications including lisinopril 40 mg p.o. daily, will be considered for additional treatment with hydrochlorothiazide on her next visit.   3) Lipids/HPL: Her recent lipid panel showed controlled LDL at 58.  She is advised to continue simvastatin 40 mg p.o. Nightly.  4)  Weight/Diet: CDE Consult will be initiated , exercise, and detailed carbohydrates information provided.  5) Chronic Care/Health Maintenance:  -she  is on ACEI/ARB and Statin medications and  is encouraged to initiate and continue to follow up with  Ophthalmology, Dentist,  Podiatrist at least yearly or according to recommendations, and advised to  stay away from smoking. I have recommended yearly flu vaccine and pneumonia vaccine at least every 5 years; moderate intensity exercise for up to 150 minutes weekly; and  sleep for at least 7 hours a day.  - I advised patient to maintain close follow up with Kari Baars, MD for primary care needs.  - Time spent with the patient: 45 minutes, of which >50% was spent in obtaining information about her symptoms, reviewing her previous labs, evaluations, and treatments, counseling her about her currently uncontrolled, complicated type 2 diabetes, hyperlipidemia, hypertension, obesity, and developing developing  plans for long term treatment based on the latest recommendations.  Melissa Tucker participated in the discussions, expressed understanding, and voiced agreement with the above plans.  All questions were answered to her satisfaction. she is encouraged to contact clinic should she have any questions or concerns prior to her return visit.  Follow up plan: - Return in about 1 week (around 11/13/2017) for follow up with meter and logs- no labs.  Marquis Lunch, MD [facility] [address] [place], [place] [address] Phone: [phone]  Fax: [phone]    11/06/2017, 5:37 PM  This note was partially dictated with voice recognition software. Similar sounding words can be transcribed inadequately or may not  be corrected upon review.

## 2017-11-06 NOTE — Patient Instructions (Signed)

## 2017-11-12 ENCOUNTER — Ambulatory Visit: Payer: Medicare HMO | Admitting: Obstetrics and Gynecology

## 2017-11-12 ENCOUNTER — Encounter: Payer: Self-pay | Admitting: *Deleted

## 2017-11-13 ENCOUNTER — Ambulatory Visit (INDEPENDENT_AMBULATORY_CARE_PROVIDER_SITE_OTHER): Payer: Medicare HMO | Admitting: "Endocrinology

## 2017-11-13 ENCOUNTER — Other Ambulatory Visit: Payer: Self-pay | Admitting: Obstetrics and Gynecology

## 2017-11-13 ENCOUNTER — Encounter: Payer: Self-pay | Admitting: "Endocrinology

## 2017-11-13 VITALS — BP 132/72 | HR 89 | Ht 69.0 in | Wt 205.0 lb

## 2017-11-13 DIAGNOSIS — Z6831 Body mass index (BMI) 31.0-31.9, adult: Secondary | ICD-10-CM

## 2017-11-13 DIAGNOSIS — E6609 Other obesity due to excess calories: Secondary | ICD-10-CM

## 2017-11-13 DIAGNOSIS — Z9119 Patient's noncompliance with other medical treatment and regimen: Secondary | ICD-10-CM | POA: Diagnosis not present

## 2017-11-13 DIAGNOSIS — E782 Mixed hyperlipidemia: Secondary | ICD-10-CM

## 2017-11-13 DIAGNOSIS — E1165 Type 2 diabetes mellitus with hyperglycemia: Secondary | ICD-10-CM

## 2017-11-13 DIAGNOSIS — I10 Essential (primary) hypertension: Secondary | ICD-10-CM

## 2017-11-13 DIAGNOSIS — Z91199 Patient's noncompliance with other medical treatment and regimen due to unspecified reason: Secondary | ICD-10-CM | POA: Insufficient documentation

## 2017-11-13 MED ORDER — METFORMIN HCL 1000 MG PO TABS: 1000.0000 mg | ORAL_TABLET | Freq: Two times a day (BID) | ORAL | 3 refills | Status: DC

## 2017-11-13 NOTE — Patient Instructions (Signed)

## 2017-11-13 NOTE — Progress Notes (Signed)
Endocrinology follow-up note       11/13/2017, 2:41 PM   Subjective:    Patient ID: Melissa Tucker, female    DOB: [birth year].  Melissa Tucker is being seen in follow-up for management of currently uncontrolled symptomatic diabetes requested by  Kari Baars, MD.   Past Medical History:  Diagnosis Date  . Anxiety and depression   . Chest tightness   . Diabetes mellitus 1994   No insulin  . DJD (degenerative joint disease)   . History of DVT (deep vein thrombosis) 1989   Following childbirth  . Hyperlipidemia   . Hypertension   . IBS (irritable bowel syndrome)    Past Surgical History:  Procedure Laterality Date  . ABDOMINAL HYSTERECTOMY    . CESAREAN SECTION  1988  . CHOLECYSTECTOMY    . COLONOSCOPY  2005   Normal findings  . SHOULDER SURGERY     Left shoulder manipulation with subcrominal decompression, capsulitis  . TOTAL KNEE ARTHROPLASTY  2003   Left  . TOTAL KNEE ARTHROPLASTY  2007   Right   Social History   Socioeconomic History  . Marital status: Divorced    Spouse name: Not on file  . Number of children: Not on file  . Years of education: Not on file  . Highest education level: Not on file  Occupational History  . Occupation: disabled    Associate Professor: UNEMPLOYED  Social Needs  . Financial resource strain: Not on file  . Food insecurity:    Worry: Not on file    Inability: Not on file  . Transportation needs:    Medical: Not on file    Non-medical: Not on file  Tobacco Use  . Smoking status: Former Smoker    Last attempt to quit: 04/10/1988    Years since quitting: 29.6  . Smokeless tobacco: Former Neurosurgeon  . Tobacco comment: Minimal prior use  Substance and Sexual Activity  . Alcohol use: No  . Drug use: No  . Sexual activity: Yes  Lifestyle  . Physical activity:    Days per week: Not on file    Minutes per session: Not on file  . Stress: Not on file  Relationships   . Social connections:    Talks on phone: Not on file    Gets together: Not on file    Attends religious service: Not on file    Active member of club or organization: Not on file    Attends meetings of clubs or organizations: Not on file    Relationship status: Not on file  Other Topics Concern  . Not on file  Social History Narrative   Divorced   Resides with daughter, also has 2 sons   No regular exercise   Outpatient Encounter Medications as of 11/13/2017  Medication Sig  . ALPRAZolam (XANAX) 0.5 MG tablet Take 0.5 mg by mouth 3 (three) times daily.    Marland Kitchen aspirin 325 MG EC tablet Take 325 mg by mouth daily.    Marland Kitchen dicyclomine (BENTYL) 10 MG capsule Take 10 mg by mouth daily.    . furosemide (LASIX) 40 MG tablet Take 40  mg by mouth daily.    . Hydrocodone-Acetaminophen 10-660 MG TABS Take by mouth as needed.    . INDOMETHACIN PO Take by mouth.  Marland Kitchen. lisinopril (PRINIVIL,ZESTRIL) 40 MG tablet Take 40 mg by mouth daily.    . metFORMIN (GLUCOPHAGE) 1000 MG tablet Take 1 tablet (1,000 mg total) by mouth 2 (two) times daily with a meal.  . simvastatin (ZOCOR) 40 MG tablet Take 40 mg by mouth at bedtime.    Marland Kitchen. venlafaxine XR (EFFEXOR-XR) 37.5 MG 24 hr capsule Take 2 capsules (75 mg total) by mouth daily.  . [DISCONTINUED] metFORMIN (GLUCOPHAGE) 500 MG tablet Take 1 tablet (500 mg total) by mouth 2 (two) times daily with a meal.  . [DISCONTINUED] ROBAXIN 500 MG tablet TAKE (1) TABLET BY MOUTH (4) TIMES DAILY. (Patient not taking: Reported on 11/06/2017)   No facility-administered encounter medications on file as of 11/13/2017.     ALLERGIES: Allergies  Allergen Reactions  . Peanut-Containing Drug Products Swelling    VACCINATION STATUS:  There is no immunization history on file for this patient.  Diabetes  She presents for her follow-up diabetic visit. She has type 2 diabetes mellitus. Onset time: She was diagnosed at approximate age of 41 years. Her disease course has been worsening.  There are no hypoglycemic associated symptoms. Pertinent negatives for hypoglycemia include no confusion, headaches, pallor or seizures. Associated symptoms include blurred vision, fatigue, polydipsia and polyuria. Pertinent negatives for diabetes include no chest pain and no polyphagia. There are no hypoglycemic complications. Symptoms are worsening. Diabetic complications include retinopathy. Risk factors for coronary artery disease include diabetes mellitus, dyslipidemia, family history, hypertension, obesity, sedentary lifestyle, post-menopausal and tobacco exposure. Current diabetic treatments: She is currently on Invokana 300 mg p.o. daily metformin/glipizide,\ Her weight is stable. She is following a generally unhealthy diet. When asked about meal planning, she reported none. She has not had a previous visit with a dietitian. She never participates in exercise. Her breakfast blood glucose range is generally >200 mg/dl. Her lunch blood glucose range is generally >200 mg/dl. Her dinner blood glucose range is generally >200 mg/dl. Her bedtime blood glucose range is generally >200 mg/dl. Her overall blood glucose range is >200 mg/dl. (  Her most recent A1c was 9.6%.) An ACE inhibitor/angiotensin II receptor blocker is being taken. Eye exam is current.  Hyperlipidemia  This is a chronic problem. The current episode started more than 1 year ago. The problem is controlled. Exacerbating diseases include diabetes and obesity. Pertinent negatives include no chest pain, myalgias or shortness of breath. Current antihyperlipidemic treatment includes statins and ezetimibe. Risk factors for coronary artery disease include dyslipidemia, diabetes mellitus, family history, obesity, hypertension, a sedentary lifestyle and post-menopausal.  Hypertension  This is a chronic problem. The current episode started more than 1 year ago. The problem is uncontrolled. Associated symptoms include blurred vision. Pertinent negatives  include no chest pain, headaches, palpitations or shortness of breath. Risk factors for coronary artery disease include diabetes mellitus, dyslipidemia, family history, obesity, post-menopausal state, sedentary lifestyle and smoking/tobacco exposure. Past treatments include ACE inhibitors. Hypertensive end-organ damage includes retinopathy.     Review of Systems  Constitutional: Positive for fatigue. Negative for chills, fever and unexpected weight change.  HENT: Negative for trouble swallowing and voice change.   Eyes: Positive for blurred vision. Negative for visual disturbance.  Respiratory: Negative for cough, shortness of breath and wheezing.   Cardiovascular: Negative for chest pain, palpitations and leg swelling.  Gastrointestinal: Negative for diarrhea, nausea and  vomiting.  Endocrine: Positive for polydipsia and polyuria. Negative for cold intolerance, heat intolerance and polyphagia.  Musculoskeletal: Negative for arthralgias and myalgias.  Skin: Negative for color change, pallor, rash and wound.  Neurological: Negative for seizures and headaches.  Psychiatric/Behavioral: Negative for confusion and suicidal ideas.    Objective:    BP 132/72   Pulse 89   Ht 5\' 9"  (1.753 m)   Wt 205 lb (93 kg)   BMI 30.27 kg/m   Wt Readings from Last 3 Encounters:  11/13/17 205 lb (93 kg)  11/06/17 203 lb (92.1 kg)  07/23/17 203 lb 9.6 oz (92.4 kg)     Physical Exam  Constitutional: She is oriented to person, place, and time. She appears well-developed.  HENT:  Head: Normocephalic and atraumatic.  Eyes: EOM are normal.  Neck: Normal range of motion. Neck supple. No tracheal deviation present. No thyromegaly present.  Cardiovascular: Normal rate and regular rhythm.  Pulmonary/Chest: Effort normal and breath sounds normal.  Abdominal: Soft. Bowel sounds are normal. There is no tenderness. There is no guarding.  Musculoskeletal: Normal range of motion. She exhibits no edema.   Neurological: She is alert and oriented to person, place, and time. She has normal reflexes. No cranial nerve deficit. Coordination normal.  Skin: Skin is warm and dry. No rash noted. No erythema. No pallor.  Psychiatric: She has a normal mood and affect. Judgment normal.  Patient has reluctant affect.    CMP ( most recent) CMP     Component Value Date/Time   NA 138 12/03/2009 1545   K 3.9 12/03/2009 1545   CL 104 12/03/2009 1545   CO2 26 12/03/2009 1545   GLUCOSE 228 (H) 12/06/2009 0833   BUN 11 09/07/2017   CREATININE 0.6 09/07/2017   CREATININE 0.39 (L) 12/03/2009 1545   CALCIUM 9.9 12/03/2009 1545   PROT 7.2 09/13/2009   ALBUMIN 4.7 09/13/2009   AST 15 09/13/2009   ALT 18 09/13/2009   ALKPHOS 100 09/13/2009     Diabetic Labs (most recent): Lab Results  Component Value Date   HGBA1C 9.6 09/07/2017   HGBA1C 7.0 09/13/2009     Lipid Panel ( most recent) Lipid Panel     Component Value Date/Time   CHOL 136 09/07/2017   TRIG 116 09/07/2017   HDL 51 09/07/2017   LDLCALC 65 09/07/2017      Assessment & Plan:   1. Uncontrolled type 2 diabetes mellitus with hyperglycemia (HCC)  - VERONIKA HEARD has currently uncontrolled symptomatic type 2 DM since 67 years of age,  with most recent A1c of 9.6 %. Recent labs reviewed. -She returns with persistently above target blood glucose profile averaging 300 mg/dL.  -her diabetes is complicated by retinopathy, obesity/sedentary life, history of smoking and TRAVIS PURK remains at a high risk for more acute and chronic complications which include CAD, CVA, CKD, retinopathy, and neuropathy. These are all discussed in detail with the patient.  - I have counseled her on diet management and weight loss, by adopting a carbohydrate restricted/protein rich diet.  -  Suggestion is made for her to avoid simple carbohydrates  from her diet including Cakes, Sweet Desserts / Pastries, Ice Cream, Soda (diet and regular), Sweet Tea,  Candies, Chips, Cookies, Store Bought Juices, Alcohol in Excess of  1-2 drinks a day, Artificial Sweeteners, and "Sugar-free" Products. This will help patient to have stable blood glucose profile and potentially avoid unintended weight gain.  - I encouraged her to switch to  unprocessed or minimally processed complex starch and increased protein intake (animal or plant source), fruits, and vegetables.  - she is advised to stick to a routine mealtimes to eat 3 meals  a day and avoid unnecessary snacks ( to snack only to correct hypoglycemia).   - she will be scheduled with Norm Salt, RDN, CDE for individualized diabetes education.  - I have approached her with the following individualized plan to manage diabetes and patient agrees:   -She returns with glycemic burden averaging 300 + mg/dl. - She is an immediate insulin candidate.   -Accordingly, she was  approached for insulin treatment with at least basal insulin. -  However, patient is conspicuously reluctant and would like to avoid/delay insulin treatment promising to do better on her diet and exercise.     -She is willing to  continue to monitor blood glucose at least twice a day-before breakfast and at bedtime and return in 5-week with repeat labs.  -Patient is encouraged to call clinic for blood glucose levels less than 70 or above 300 mg /dl. -In the meantime, I advised her to increase metformin to 1000 mg p.o. twice daily-after breakfast and after supper.     - she will be considered for incretin therapy as appropriate next visit. - Patient specific target  A1c;  LDL, HDL, Triglycerides, and  Waist Circumference were discussed in detail.  2) BP/HTN: Her blood pressure is controlled to target.   I advised her to continue her current medications including lisinopril 40 mg p.o. daily, will be considered for additional treatment with hydrochlorothiazide on her next visit.   3) Lipids/HPL: Her recent lipid panel showed controlled LDL  at 58.  She is advised to continue simvastatin 40 mg p.o. Nightly.  4)  Weight/Diet: CDE Consult will be initiated , exercise, and detailed carbohydrates information provided.  5) Chronic Care/Health Maintenance:  -she  is on ACEI/ARB and Statin medications and  is encouraged to initiate and continue to follow up with Ophthalmology, Dentist,  Podiatrist at least yearly or according to recommendations, and advised to  stay away from smoking. I have recommended yearly flu vaccine and pneumonia vaccine at least every 5 years; moderate intensity exercise for up to 150 minutes weekly; and  sleep for at least 7 hours a day.  - I advised patient to maintain close follow up with Kari Baars, MD for primary care needs.  - Time spent with the patient: 25 min, of which >50% was spent in reviewing her blood glucose logs , discussing her hypo- and hyper-glycemic episodes, reviewing her current and  previous labs and insulin doses and developing a plan to avoid hypo- and hyper-glycemia. Please refer to Patient Instructions for Blood Glucose Monitoring and Insulin/Medications Dosing Guide"  in media tab for additional information. Melissa Tucker participated in the discussions, expressed understanding, and voiced agreement with the above plans.  All questions were answered to her satisfaction. she is encouraged to contact clinic should she have any questions or concerns prior to her return visit.  Follow up plan: - Return in about 5 weeks (around 12/18/2017) for Follow up with Pre-visit Labs, Meter, and Logs.  Marquis Lunch, MD [facility] [address] [place], [place] [address] Phone: [phone]  Fax: [phone]    11/13/2017, 2:41 PM  This note was partially dictated with voice recognition software. Similar sounding words can be transcribed inadequately or may not  be corrected upon review.

## 2017-11-13 NOTE — Telephone Encounter (Signed)
1 yr refil on effexor for vasomotor sx.

## 2017-12-03 DIAGNOSIS — Z79891 Long term (current) use of opiate analgesic: Secondary | ICD-10-CM | POA: Diagnosis not present

## 2017-12-13 DIAGNOSIS — I10 Essential (primary) hypertension: Secondary | ICD-10-CM | POA: Diagnosis not present

## 2017-12-13 DIAGNOSIS — E1165 Type 2 diabetes mellitus with hyperglycemia: Secondary | ICD-10-CM | POA: Diagnosis not present

## 2017-12-13 DIAGNOSIS — M545 Low back pain: Secondary | ICD-10-CM | POA: Diagnosis not present

## 2017-12-13 DIAGNOSIS — F419 Anxiety disorder, unspecified: Secondary | ICD-10-CM | POA: Diagnosis not present

## 2017-12-19 ENCOUNTER — Ambulatory Visit: Payer: Medicare HMO | Admitting: "Endocrinology

## 2017-12-21 ENCOUNTER — Other Ambulatory Visit (HOSPITAL_COMMUNITY): Payer: Self-pay | Admitting: Pulmonary Disease

## 2017-12-21 DIAGNOSIS — M545 Low back pain: Secondary | ICD-10-CM

## 2017-12-27 ENCOUNTER — Ambulatory Visit (HOSPITAL_COMMUNITY): Payer: Medicare HMO

## 2017-12-27 ENCOUNTER — Encounter (HOSPITAL_COMMUNITY): Payer: Self-pay

## 2018-01-07 ENCOUNTER — Ambulatory Visit: Payer: Medicare HMO | Admitting: "Endocrinology

## 2018-01-11 DIAGNOSIS — Z6831 Body mass index (BMI) 31.0-31.9, adult: Secondary | ICD-10-CM | POA: Diagnosis not present

## 2018-01-11 DIAGNOSIS — E1165 Type 2 diabetes mellitus with hyperglycemia: Secondary | ICD-10-CM | POA: Diagnosis not present

## 2018-01-11 DIAGNOSIS — E6609 Other obesity due to excess calories: Secondary | ICD-10-CM | POA: Diagnosis not present

## 2018-01-12 LAB — COMPLETE METABOLIC PANEL WITH GFR
AG RATIO: 2.1 (calc) (ref 1.0–2.5)
ALBUMIN MSPROF: 4.6 g/dL (ref 3.6–5.1)
ALT: 10 U/L (ref 6–29)
AST: 10 U/L (ref 10–35)
Alkaline phosphatase (APISO): 121 U/L (ref 33–130)
BILIRUBIN TOTAL: 0.6 mg/dL (ref 0.2–1.2)
BUN: 15 mg/dL (ref 7–25)
CALCIUM: 10.1 mg/dL (ref 8.6–10.4)
CHLORIDE: 98 mmol/L (ref 98–110)
CO2: 27 mmol/L (ref 20–32)
Creat: 0.58 mg/dL (ref 0.50–0.99)
GFR, EST AFRICAN AMERICAN: 111 mL/min/{1.73_m2} (ref >=60)
GFR, EST NON AFRICAN AMERICAN: 96 mL/min/{1.73_m2} (ref >=60)
Globulin: 2.2 g/dL (calc) (ref 1.9–3.7)
Glucose, Bld: 346 mg/dL — ABNORMAL HIGH (ref 65–139)
Potassium: 4.3 mmol/L (ref 3.5–5.3)
Sodium: 136 mmol/L (ref 135–146)
TOTAL PROTEIN: 6.8 g/dL (ref 6.1–8.1)

## 2018-01-12 LAB — HEMOGLOBIN A1C
Hgb A1c MFr Bld: 9.6 % of total Hgb — ABNORMAL HIGH (ref <5.7)
MEAN PLASMA GLUCOSE: 229 (calc)
eAG (mmol/L): 12.7 (calc)

## 2018-01-12 LAB — TSH: TSH: 0.58 mIU/L (ref 0.40–4.50)

## 2018-01-12 LAB — T4, FREE: Free T4: 1.1 ng/dL (ref 0.8–1.8)

## 2018-01-14 ENCOUNTER — Encounter: Payer: Self-pay | Admitting: "Endocrinology

## 2018-01-14 ENCOUNTER — Ambulatory Visit (INDEPENDENT_AMBULATORY_CARE_PROVIDER_SITE_OTHER): Payer: Medicare HMO | Admitting: "Endocrinology

## 2018-01-14 VITALS — BP 138/73 | HR 79 | Ht 69.0 in | Wt 200.0 lb

## 2018-01-14 DIAGNOSIS — E1165 Type 2 diabetes mellitus with hyperglycemia: Secondary | ICD-10-CM

## 2018-01-14 DIAGNOSIS — Z6831 Body mass index (BMI) 31.0-31.9, adult: Secondary | ICD-10-CM | POA: Diagnosis not present

## 2018-01-14 DIAGNOSIS — I10 Essential (primary) hypertension: Secondary | ICD-10-CM

## 2018-01-14 DIAGNOSIS — Z91199 Patient's noncompliance with other medical treatment and regimen due to unspecified reason: Secondary | ICD-10-CM

## 2018-01-14 DIAGNOSIS — Z9119 Patient's noncompliance with other medical treatment and regimen: Secondary | ICD-10-CM

## 2018-01-14 DIAGNOSIS — E782 Mixed hyperlipidemia: Secondary | ICD-10-CM

## 2018-01-14 DIAGNOSIS — E6609 Other obesity due to excess calories: Secondary | ICD-10-CM | POA: Diagnosis not present

## 2018-01-14 MED ORDER — GLIPIZIDE ER 5 MG PO TB24
5.0000 mg | ORAL_TABLET | Freq: Every day | ORAL | 3 refills | Status: DC
Start: 2018-01-14 — End: 2018-05-16

## 2018-01-14 NOTE — Progress Notes (Signed)
Endocrinology follow-up note       01/14/2018, 2:01 PM   Subjective:    Patient ID: Melissa Tucker, female    DOB: 30-Oct-1950.  Melissa Tucker is being seen in follow-up for management of currently uncontrolled symptomatic diabetes requested by  Kari Baars, MD.   Past Medical History:  Diagnosis Date  . Anxiety and depression   . Chest tightness   . Diabetes mellitus 1994   No insulin  . DJD (degenerative joint disease)   . History of DVT (deep vein thrombosis) 1989   Following childbirth  . Hyperlipidemia   . Hypertension   . IBS (irritable bowel syndrome)    Past Surgical History:  Procedure Laterality Date  . ABDOMINAL HYSTERECTOMY    . CESAREAN SECTION  1988  . CHOLECYSTECTOMY    . COLONOSCOPY  2005   Normal findings  . SHOULDER SURGERY     Left shoulder manipulation with subcrominal decompression, capsulitis  . TOTAL KNEE ARTHROPLASTY  2003   Left  . TOTAL KNEE ARTHROPLASTY  2007   Right   Social History   Socioeconomic History  . Marital status: Divorced    Spouse name: Not on file  . Number of children: Not on file  . Years of education: Not on file  . Highest education level: Not on file  Occupational History  . Occupation: disabled    Associate Professor: UNEMPLOYED  Social Needs  . Financial resource strain: Not on file  . Food insecurity:    Worry: Not on file    Inability: Not on file  . Transportation needs:    Medical: Not on file    Non-medical: Not on file  Tobacco Use  . Smoking status: Former Smoker    Last attempt to quit: 04/10/1988    Years since quitting: 29.7  . Smokeless tobacco: Former Neurosurgeon  . Tobacco comment: Minimal prior use  Substance and Sexual Activity  . Alcohol use: No  . Drug use: No  . Sexual activity: Yes  Lifestyle  . Physical activity:    Days per week: Not on file    Minutes per session: Not on file  . Stress: Not on file  Relationships   . Social connections:    Talks on phone: Not on file    Gets together: Not on file    Attends religious service: Not on file    Active member of club or organization: Not on file    Attends meetings of clubs or organizations: Not on file    Relationship status: Not on file  Other Topics Concern  . Not on file  Social History Narrative   Divorced   Resides with daughter, also has 2 sons   No regular exercise   Outpatient Encounter Medications as of 01/14/2018  Medication Sig  . ALPRAZolam (XANAX) 0.5 MG tablet Take 0.5 mg by mouth 3 (three) times daily.    Marland Kitchen aspirin 325 MG EC tablet Take 325 mg by mouth daily.    Marland Kitchen dicyclomine (BENTYL) 10 MG capsule Take 10 mg by mouth daily.    . furosemide (LASIX) 40 MG tablet Take 40  mg by mouth daily.    Marland Kitchen glipiZIDE (GLUCOTROL XL) 5 MG 24 hr tablet Take 1 tablet (5 mg total) by mouth daily with breakfast.  . Hydrocodone-Acetaminophen 10-660 MG TABS Take by mouth as needed.    . INDOMETHACIN PO Take by mouth.  Marland Kitchen lisinopril (PRINIVIL,ZESTRIL) 40 MG tablet Take 40 mg by mouth daily.    . metFORMIN (GLUCOPHAGE) 1000 MG tablet Take 1 tablet (1,000 mg total) by mouth 2 (two) times daily with a meal.  . simvastatin (ZOCOR) 40 MG tablet Take 40 mg by mouth at bedtime.    Marland Kitchen venlafaxine XR (EFFEXOR-XR) 37.5 MG 24 hr capsule TAKE 2 CAPSULES BY MOUTH DAILY.   No facility-administered encounter medications on file as of 01/14/2018.     ALLERGIES: Allergies  Allergen Reactions  . Peanut-Containing Drug Products Swelling    VACCINATION STATUS:  There is no immunization history on file for this patient.  Diabetes  She presents for her follow-up diabetic visit. She has type 2 diabetes mellitus. Onset time: She was diagnosed at approximate age of 67 years. Her disease course has been worsening. There are no hypoglycemic associated symptoms. Pertinent negatives for hypoglycemia include no confusion, headaches, pallor or seizures. Associated symptoms include  blurred vision, fatigue, polydipsia and polyuria. Pertinent negatives for diabetes include no chest pain and no polyphagia. There are no hypoglycemic complications. Symptoms are worsening. Diabetic complications include retinopathy. Risk factors for coronary artery disease include diabetes mellitus, dyslipidemia, family history, hypertension, obesity, sedentary lifestyle, post-menopausal and tobacco exposure. Current diabetic treatments: She is currently on Invokana 300 mg p.o. daily metformin/glipizide,\ Her weight is fluctuating minimally. She is following a generally unhealthy diet. When asked about meal planning, she reported none. She has not had a previous visit with a dietitian. She never participates in exercise. Her overall blood glucose range is >200 mg/dl. ( She returns with a variant random blood glucose readings averaging 235.  Her previsit labs show A1c of 9.6% unchanged from her last visit.   ) An ACE inhibitor/angiotensin II receptor blocker is being taken. Eye exam is current.  Hyperlipidemia  This is a chronic problem. The current episode started more than 1 year ago. The problem is controlled. Exacerbating diseases include diabetes and obesity. Pertinent negatives include no chest pain, myalgias or shortness of breath. Current antihyperlipidemic treatment includes statins and ezetimibe. Risk factors for coronary artery disease include dyslipidemia, diabetes mellitus, family history, obesity, hypertension, a sedentary lifestyle and post-menopausal.  Hypertension  This is a chronic problem. The current episode started more than 1 year ago. The problem is uncontrolled. Associated symptoms include blurred vision. Pertinent negatives include no chest pain, headaches, palpitations or shortness of breath. Risk factors for coronary artery disease include diabetes mellitus, dyslipidemia, family history, obesity, post-menopausal state, sedentary lifestyle and smoking/tobacco exposure. Past treatments  include ACE inhibitors. Hypertensive end-organ damage includes retinopathy.     Review of Systems  Constitutional: Positive for fatigue. Negative for chills, fever and unexpected weight change.  HENT: Negative for trouble swallowing and voice change.   Eyes: Positive for blurred vision. Negative for visual disturbance.  Respiratory: Negative for cough, shortness of breath and wheezing.   Cardiovascular: Negative for chest pain, palpitations and leg swelling.  Gastrointestinal: Negative for diarrhea, nausea and vomiting.  Endocrine: Positive for polydipsia and polyuria. Negative for cold intolerance, heat intolerance and polyphagia.  Musculoskeletal: Negative for arthralgias and myalgias.  Skin: Negative for color change, pallor, rash and wound.  Neurological: Negative for seizures and headaches.  Psychiatric/Behavioral: Negative for confusion and suicidal ideas.    Objective:    BP 138/73   Pulse 79   Ht 5\' 9"  (1.753 m)   Wt 200 lb (90.7 kg)   BMI 29.53 kg/m   Wt Readings from Last 3 Encounters:  01/14/18 200 lb (90.7 kg)  11/13/17 205 lb (93 kg)  11/06/17 203 lb (92.1 kg)     Physical Exam  Constitutional: She is oriented to person, place, and time. She appears well-developed.  HENT:  Head: Normocephalic and atraumatic.  Eyes: EOM are normal.  Neck: Normal range of motion. Neck supple. No tracheal deviation present. No thyromegaly present.  Cardiovascular: Normal rate and regular rhythm.  Pulmonary/Chest: Effort normal and breath sounds normal.  Abdominal: Soft. Bowel sounds are normal. There is no tenderness. There is no guarding.  Musculoskeletal: Normal range of motion. She exhibits no edema.  Neurological: She is alert and oriented to person, place, and time. She has normal reflexes. No cranial nerve deficit. Coordination normal.  Skin: Skin is warm and dry. No rash noted. No erythema. No pallor.  Psychiatric: She has a normal mood and affect. Judgment normal.   Patient has reluctant affect.    CMP ( most recent) CMP     Component Value Date/Time   NA 136 01/11/2018 1248   K 4.3 01/11/2018 1248   CL 98 01/11/2018 1248   CO2 27 01/11/2018 1248   GLUCOSE 346 (H) 01/11/2018 1248   BUN 15 01/11/2018 1248   BUN 11 09/07/2017   CREATININE 0.58 01/11/2018 1248   CALCIUM 10.1 01/11/2018 1248   PROT 6.8 01/11/2018 1248   ALBUMIN 4.7 09/13/2009   AST 10 01/11/2018 1248   ALT 10 01/11/2018 1248   ALKPHOS 100 09/13/2009   BILITOT 0.6 01/11/2018 1248   GFRNONAA 96 01/11/2018 1248   GFRAA 111 01/11/2018 1248     Diabetic Labs (most recent): Lab Results  Component Value Date   HGBA1C 9.6 (H) 01/11/2018   HGBA1C 9.6 09/07/2017   HGBA1C 7.0 09/13/2009     Lipid Panel ( most recent) Lipid Panel     Component Value Date/Time   CHOL 136 09/07/2017   TRIG 116 09/07/2017   HDL 51 09/07/2017   LDLCALC 65 09/07/2017      Assessment & Plan:   1. Uncontrolled type 2 diabetes mellitus with hyperglycemia (HCC)  - LOWELLA KINDLEY has currently uncontrolled symptomatic type 2 DM since 67 years of age,  with most recent A1c of 9.6 %. Recent labs reviewed. -She returns with persistently above target blood glucose profile, averaging 235 mg/dL.   -her diabetes is complicated by retinopathy, obesity/sedentary life, history of smoking and TOMMYE LEHENBAUER remains at a high risk for more acute and chronic complications which include CAD, CVA, CKD, retinopathy, and neuropathy. These are all discussed in detail with the patient.  - I have counseled her on diet management and weight loss, by adopting a carbohydrate restricted/protein rich diet.  -  Suggestion is made for her to avoid simple carbohydrates  from her diet including Cakes, Sweet Desserts / Pastries, Ice Cream, Soda (diet and regular), Sweet Tea, Candies, Chips, Cookies, Store Bought Juices, Alcohol in Excess of  1-2 drinks a day, Artificial Sweeteners, and "Sugar-free" Products. This will help  patient to have stable blood glucose profile and potentially avoid unintended weight gain.   - I encouraged her to switch to  unprocessed or minimally processed complex starch and increased protein intake (animal or plant  source), fruits, and vegetables.  - she is advised to stick to a routine mealtimes to eat 3 meals  a day and avoid unnecessary snacks ( to snack only to correct hypoglycemia).   - she will be scheduled with Norm Salt, RDN, CDE for individualized diabetes education.  - I have approached her with the following individualized plan to manage diabetes and patient agrees:   -Based on her presentation with significantly above target glycemic profile averaging 235, she is an immediate candidate for basal insulin and was approached for same.   -  However, patient is very reluctant and would like to avoid/delay insulin treatment promising to do better on her diet and exercise.     -She is willing to  continue to monitor blood glucose at least twice a day-before breakfast and at bedtime.  -She is advised to continue metformin 1000 mg p.o. twice daily, I discussed and initiated Glucotrol 5 mg p.o. daily with breakfast.   - she will be considered for incretin therapy as appropriate next visit. - Patient specific target  A1c;  LDL, HDL, Triglycerides, and  Waist Circumference were discussed in detail.  2) BP/HTN: Her blood pressure is controlled to target.   I advised her to continue her current medications including lisinopril 40 mg p.o. daily, will be considered for additional treatment with hydrochlorothiazide on her next visit.   3) Lipids/HPL: Her recent lipid panel showed controlled LDL at 58.  She is advised to continue simvastatin 40 mg p.o. Nightly.  4)  Weight/Diet: CDE Consult will be initiated , exercise, and detailed carbohydrates information provided.  5) Chronic Care/Health Maintenance:  -she  is on ACEI/ARB and Statin medications and  is encouraged to initiate  and continue to follow up with Ophthalmology, Dentist,  Podiatrist at least yearly or according to recommendations, and advised to  stay away from smoking. I have recommended yearly flu vaccine and pneumonia vaccine at least every 5 years; moderate intensity exercise for up to 150 minutes weekly; and  sleep for at least 7 hours a day.  - I advised patient to maintain close follow up with Kari Baars, MD for primary care needs.  - Time spent with the patient: 25 min, of which >50% was spent in reviewing her blood glucose logs , discussing her hypo- and hyper-glycemic episodes, reviewing her current and  previous labs and insulin doses and developing a plan to avoid hypo- and hyper-glycemia. Please refer to Patient Instructions for Blood Glucose Monitoring and Insulin/Medications Dosing Guide"  in media tab for additional information. Melissa Tucker participated in the discussions, expressed understanding, and voiced agreement with the above plans.  All questions were answered to her satisfaction. she is encouraged to contact clinic should she have any questions or concerns prior to her return visit.  Follow up plan: - Return in about 3 months (around 04/16/2018).  Marquis Lunch, MD [facility] [address] [place], [place] [address] Phone: [phone]  Fax: [phone]    01/14/2018, 2:01 PM  This note was partially dictated with voice recognition software. Similar sounding words can be transcribed inadequately or may not  be corrected upon review.

## 2018-01-14 NOTE — Patient Instructions (Signed)

## 2018-01-24 ENCOUNTER — Other Ambulatory Visit (HOSPITAL_COMMUNITY): Payer: Self-pay | Admitting: Pulmonary Disease

## 2018-01-24 DIAGNOSIS — Z1231 Encounter for screening mammogram for malignant neoplasm of breast: Secondary | ICD-10-CM

## 2018-01-30 ENCOUNTER — Encounter (INDEPENDENT_AMBULATORY_CARE_PROVIDER_SITE_OTHER): Payer: Self-pay | Admitting: *Deleted

## 2018-02-01 ENCOUNTER — Ambulatory Visit (HOSPITAL_COMMUNITY): Payer: Medicare HMO

## 2018-02-06 ENCOUNTER — Ambulatory Visit (HOSPITAL_COMMUNITY)
Admission: RE | Admit: 2018-02-06 | Discharge: 2018-02-06 | Disposition: A | Discharge status: Home / Self Care | Payer: Medicare HMO | Source: Ambulatory Visit | Attending: Pulmonary Disease | Admitting: Pulmonary Disease

## 2018-02-06 ENCOUNTER — Encounter (HOSPITAL_COMMUNITY): Payer: Self-pay

## 2018-02-06 ENCOUNTER — Ambulatory Visit (HOSPITAL_COMMUNITY): Payer: Medicare HMO

## 2018-02-06 DIAGNOSIS — Z1231 Encounter for screening mammogram for malignant neoplasm of breast: Secondary | ICD-10-CM | POA: Diagnosis not present

## 2018-03-18 ENCOUNTER — Other Ambulatory Visit: Payer: Self-pay | Admitting: "Endocrinology

## 2018-04-16 ENCOUNTER — Ambulatory Visit: Payer: Medicare HMO | Admitting: "Endocrinology

## 2018-05-06 ENCOUNTER — Ambulatory Visit: Payer: Self-pay | Admitting: "Endocrinology

## 2018-05-15 ENCOUNTER — Ambulatory Visit: Payer: Self-pay | Admitting: "Endocrinology

## 2018-05-15 ENCOUNTER — Other Ambulatory Visit: Payer: Self-pay | Admitting: "Endocrinology

## 2018-05-16 ENCOUNTER — Other Ambulatory Visit (HOSPITAL_COMMUNITY): Payer: Self-pay | Admitting: Pulmonary Disease

## 2018-05-16 ENCOUNTER — Other Ambulatory Visit: Payer: Self-pay | Admitting: Pulmonary Disease

## 2018-05-16 DIAGNOSIS — M545 Low back pain, unspecified: Secondary | ICD-10-CM

## 2018-05-16 LAB — COMPLETE METABOLIC PANEL WITH GFR
AG Ratio: 2 (calc) (ref 1.0–2.5)
ALT: 15 U/L (ref 6–29)
AST: 14 U/L (ref 10–35)
Albumin: 4.5 g/dL (ref 3.6–5.1)
Alkaline phosphatase (APISO): 148 U/L (ref 37–153)
BILIRUBIN TOTAL: 0.5 mg/dL (ref 0.2–1.2)
BUN: 16 mg/dL (ref 7–25)
CALCIUM: 9.7 mg/dL (ref 8.6–10.4)
CHLORIDE: 99 mmol/L (ref 98–110)
CO2: 28 mmol/L (ref 20–32)
Creat: 0.56 mg/dL (ref 0.50–0.99)
GFR, EST AFRICAN AMERICAN: 113 mL/min/{1.73_m2} (ref >=60)
GFR, EST NON AFRICAN AMERICAN: 97 mL/min/{1.73_m2} (ref >=60)
GLOBULIN: 2.3 g/dL (ref 1.9–3.7)
Glucose, Bld: 283 mg/dL — ABNORMAL HIGH (ref 65–139)
POTASSIUM: 4.5 mmol/L (ref 3.5–5.3)
SODIUM: 137 mmol/L (ref 135–146)
Total Protein: 6.8 g/dL (ref 6.1–8.1)

## 2018-05-16 LAB — HEMOGLOBIN A1C
EAG (MMOL/L): 13.3 (calc)
Hgb A1c MFr Bld: 10 % of total Hgb — ABNORMAL HIGH (ref <5.7)
MEAN PLASMA GLUCOSE: 240 (calc)

## 2018-05-24 ENCOUNTER — Ambulatory Visit (HOSPITAL_COMMUNITY)
Admission: RE | Admit: 2018-05-24 | Discharge: 2018-05-24 | Disposition: A | Discharge status: Home / Self Care | Payer: Medicare Other | Source: Ambulatory Visit | Attending: Pulmonary Disease | Admitting: Pulmonary Disease

## 2018-05-24 DIAGNOSIS — M545 Low back pain, unspecified: Secondary | ICD-10-CM

## 2018-06-10 ENCOUNTER — Ambulatory Visit (INDEPENDENT_AMBULATORY_CARE_PROVIDER_SITE_OTHER): Payer: Medicare Other | Admitting: "Endocrinology

## 2018-06-10 ENCOUNTER — Encounter: Payer: Self-pay | Admitting: "Endocrinology

## 2018-06-10 VITALS — BP 163/80 | HR 77 | Resp 12 | Ht 69.0 in | Wt 211.8 lb

## 2018-06-10 DIAGNOSIS — I10 Essential (primary) hypertension: Secondary | ICD-10-CM | POA: Diagnosis not present

## 2018-06-10 DIAGNOSIS — E6609 Other obesity due to excess calories: Secondary | ICD-10-CM | POA: Diagnosis not present

## 2018-06-10 DIAGNOSIS — E1165 Type 2 diabetes mellitus with hyperglycemia: Secondary | ICD-10-CM

## 2018-06-10 DIAGNOSIS — E782 Mixed hyperlipidemia: Secondary | ICD-10-CM

## 2018-06-10 DIAGNOSIS — Z6831 Body mass index (BMI) 31.0-31.9, adult: Secondary | ICD-10-CM

## 2018-06-10 MED ORDER — GLIPIZIDE ER 10 MG PO TB24: 10.0000 mg | ORAL_TABLET | Freq: Every day | ORAL | 3 refills | Status: DC

## 2018-06-10 NOTE — Progress Notes (Signed)
Endocrinology follow-up note       06/10/2018, 2:36 PM   Subjective:    Patient ID: Melissa Tucker, female    DOB: Oct 16, 1950.  Melissa Tucker is being seen in follow-up for management of currently uncontrolled symptomatic diabetes requested by  Kari Baars, MD.   Past Medical History:  Diagnosis Date  . Anxiety and depression   . Chest tightness   . Diabetes mellitus 1994   No insulin  . DJD (degenerative joint disease)   . History of DVT (deep vein thrombosis) 1989   Following childbirth  . Hyperlipidemia   . Hypertension   . IBS (irritable bowel syndrome)    Past Surgical History:  Procedure Laterality Date  . ABDOMINAL HYSTERECTOMY    . CESAREAN SECTION  1988  . CHOLECYSTECTOMY    . COLONOSCOPY  2005   Normal findings  . SHOULDER SURGERY     Left shoulder manipulation with subcrominal decompression, capsulitis  . TOTAL KNEE ARTHROPLASTY  2003   Left  . TOTAL KNEE ARTHROPLASTY  2007   Right   Social History   Socioeconomic History  . Marital status: Divorced    Spouse name: Not on file  . Number of children: Not on file  . Years of education: Not on file  . Highest education level: Not on file  Occupational History  . Occupation: disabled    Associate Professor: UNEMPLOYED  Social Needs  . Financial resource strain: Not on file  . Food insecurity:    Worry: Not on file    Inability: Not on file  . Transportation needs:    Medical: Not on file    Non-medical: Not on file  Tobacco Use  . Smoking status: Former Smoker    Last attempt to quit: 04/10/1988    Years since quitting: 30.1  . Smokeless tobacco: Former Neurosurgeon  . Tobacco comment: Minimal prior use  Substance and Sexual Activity  . Alcohol use: No  . Drug use: No  . Sexual activity: Yes  Lifestyle  . Physical activity:    Days per week: Not on file    Minutes per session: Not on file  . Stress: Not on file  Relationships   . Social connections:    Talks on phone: Not on file    Gets together: Not on file    Attends religious service: Not on file    Active member of club or organization: Not on file    Attends meetings of clubs or organizations: Not on file    Relationship status: Not on file  Other Topics Concern  . Not on file  Social History Narrative   Divorced   Resides with daughter, also has 2 sons   No regular exercise   Outpatient Encounter Medications as of 06/10/2018  Medication Sig  . ALPRAZolam (XANAX) 0.5 MG tablet Take 0.5 mg by mouth 3 (three) times daily.    Marland Kitchen aspirin 325 MG EC tablet Take 325 mg by mouth daily.    Marland Kitchen dicyclomine (BENTYL) 10 MG capsule Take 10 mg by mouth daily.    . furosemide (LASIX) 40 MG tablet Take 40  mg by mouth daily.    Marland Kitchen glipiZIDE (GLUCOTROL XL) 10 MG 24 hr tablet Take 1 tablet (10 mg total) by mouth daily with breakfast.  . Hydrocodone-Acetaminophen 10-660 MG TABS Take by mouth as needed.    . INDOMETHACIN PO Take by mouth.  Marland Kitchen lisinopril (PRINIVIL,ZESTRIL) 40 MG tablet Take 40 mg by mouth daily.    . metFORMIN (GLUCOPHAGE) 1000 MG tablet TAKE ONE TABLET BY MOUTH TWICE A DAY WITH A MEAL  . simvastatin (ZOCOR) 40 MG tablet Take 40 mg by mouth at bedtime.    Marland Kitchen venlafaxine XR (EFFEXOR-XR) 37.5 MG 24 hr capsule TAKE 2 CAPSULES BY MOUTH DAILY.  . [DISCONTINUED] GLIPIZIDE XL 5 MG 24 hr tablet TAKE 1 TABLET BY MOUTH ONCE A DAY.   No facility-administered encounter medications on file as of 06/10/2018.     ALLERGIES: Allergies  Allergen Reactions  . Peanut-Containing Drug Products Swelling    VACCINATION STATUS:  There is no immunization history on file for this patient.  Diabetes  She presents for her follow-up diabetic visit. She has type 2 diabetes mellitus. Onset time: She was diagnosed at approximate age of 68 years. Her disease course has been fluctuating. There are no hypoglycemic associated symptoms. Pertinent negatives for hypoglycemia include no  confusion, headaches, pallor or seizures. Associated symptoms include blurred vision, fatigue, polydipsia and polyuria. Pertinent negatives for diabetes include no chest pain and no polyphagia. There are no hypoglycemic complications. Symptoms are worsening. Diabetic complications include retinopathy. Risk factors for coronary artery disease include diabetes mellitus, dyslipidemia, family history, hypertension, obesity, sedentary lifestyle, post-menopausal and tobacco exposure. Current diabetic treatments: She is currently on Invokana 300 mg p.o. daily metformin/glipizide,\ She is compliant with treatment some of the time. Her weight is fluctuating minimally. She is following a generally unhealthy diet. When asked about meal planning, she reported none. She has not had a previous visit with a dietitian. She never participates in exercise. Her breakfast blood glucose range is generally 180-200 mg/dl. Her bedtime blood glucose range is generally >200 mg/dl. Her overall blood glucose range is >200 mg/dl. ( Her previsit labs show A1c of 9.6% unchanged from her last visit.   ) An ACE inhibitor/angiotensin II receptor blocker is being taken. Eye exam is current.  Hyperlipidemia  This is a chronic problem. The current episode started more than 1 year ago. The problem is controlled. Exacerbating diseases include diabetes and obesity. Pertinent negatives include no chest pain, myalgias or shortness of breath. Current antihyperlipidemic treatment includes statins and ezetimibe. Risk factors for coronary artery disease include dyslipidemia, diabetes mellitus, family history, obesity, hypertension, a sedentary lifestyle and post-menopausal.  Hypertension  This is a chronic problem. The current episode started more than 1 year ago. The problem is uncontrolled. Associated symptoms include blurred vision. Pertinent negatives include no chest pain, headaches, palpitations or shortness of breath. Risk factors for coronary  artery disease include diabetes mellitus, dyslipidemia, family history, obesity, post-menopausal state, sedentary lifestyle and smoking/tobacco exposure. Past treatments include ACE inhibitors. Hypertensive end-organ damage includes retinopathy.     Review of Systems  Constitutional: Positive for fatigue. Negative for chills, fever and unexpected weight change.  HENT: Negative for trouble swallowing and voice change.   Eyes: Positive for blurred vision. Negative for visual disturbance.  Respiratory: Negative for cough, shortness of breath and wheezing.   Cardiovascular: Negative for chest pain, palpitations and leg swelling.  Gastrointestinal: Negative for diarrhea, nausea and vomiting.  Endocrine: Positive for polydipsia and polyuria. Negative for cold intolerance,  heat intolerance and polyphagia.  Musculoskeletal: Negative for arthralgias and myalgias.  Skin: Negative for color change, pallor, rash and wound.  Neurological: Negative for seizures and headaches.  Psychiatric/Behavioral: Negative for confusion and suicidal ideas.    Objective:    BP (!) 163/80   Pulse 77   Resp 12   Ht 5\' 9"  (1.753 m)   Wt 211 lb 12.8 oz (96.1 kg)   SpO2 94% Comment: room air  BMI 31.28 kg/m   Wt Readings from Last 3 Encounters:  06/10/18 211 lb 12.8 oz (96.1 kg)  01/14/18 200 lb (90.7 kg)  11/13/17 205 lb (93 kg)     Physical Exam Constitutional:      Appearance: She is well-developed.  HENT:     Head: Normocephalic and atraumatic.  Neck:     Musculoskeletal: Normal range of motion and neck supple.     Thyroid: No thyromegaly.     Trachea: No tracheal deviation.  Cardiovascular:     Rate and Rhythm: Normal rate and regular rhythm.  Pulmonary:     Effort: Pulmonary effort is normal.     Breath sounds: Normal breath sounds.  Abdominal:     General: Bowel sounds are normal.     Palpations: Abdomen is soft.     Tenderness: There is no abdominal tenderness. There is no guarding.   Musculoskeletal: Normal range of motion.  Skin:    General: Skin is warm and dry.     Coloration: Skin is not pale.     Findings: No erythema or rash.  Neurological:     Mental Status: She is alert and oriented to person, place, and time.     Cranial Nerves: No cranial nerve deficit.     Coordination: Coordination normal.     Deep Tendon Reflexes: Reflexes are normal and symmetric.  Psychiatric:        Judgment: Judgment normal.     Comments: Patient has reluctant affect.     CMP ( most recent) CMP     Component Value Date/Time   NA 137 05/15/2018 1129   K 4.5 05/15/2018 1129   CL 99 05/15/2018 1129   CO2 28 05/15/2018 1129   GLUCOSE 283 (H) 05/15/2018 1129   BUN 16 05/15/2018 1129   BUN 11 09/07/2017   CREATININE 0.56 05/15/2018 1129   CALCIUM 9.7 05/15/2018 1129   PROT 6.8 05/15/2018 1129   ALBUMIN 4.7 09/13/2009   AST 14 05/15/2018 1129   ALT 15 05/15/2018 1129   ALKPHOS 100 09/13/2009   BILITOT 0.5 05/15/2018 1129   GFRNONAA 97 05/15/2018 1129   GFRAA 113 05/15/2018 1129     Diabetic Labs (most recent): Lab Results  Component Value Date   HGBA1C 10.0 (H) 05/15/2018   HGBA1C 9.6 (H) 01/11/2018   HGBA1C 9.6 09/07/2017     Lipid Panel ( most recent) Lipid Panel     Component Value Date/Time   CHOL 136 09/07/2017   TRIG 116 09/07/2017   HDL 51 09/07/2017   LDLCALC 65 09/07/2017      Assessment & Plan:   1. Uncontrolled type 2 diabetes mellitus with hyperglycemia (HCC)  - EMMERSON ZANELLI has currently uncontrolled symptomatic type 2 DM since 68 years of age,  with most recent A1c of  10 %. Recent labs reviewed. -She returns with persistently above target blood glucose profile, averaging 245 mg/dL.   -her diabetes is complicated by retinopathy, obesity/sedentary life, history of smoking and BRAILEE BOUDREAU remains at a  high risk for more acute and chronic complications which include CAD, CVA, CKD, retinopathy, and neuropathy. These are all discussed in  detail with the patient.  - I have counseled her on diet management and weight loss, by adopting a carbohydrate restricted/protein rich diet.  - Patient admits there is a room for improvement in her diet and drink choices. -  Suggestion is made for her to avoid simple carbohydrates  from her diet including Cakes, Sweet Desserts / Pastries, Ice Cream, Soda (diet and regular), Sweet Tea, Candies, Chips, Cookies, Store Bought Juices, Alcohol in Excess of  1-2 drinks a day, Artificial Sweeteners, and "Sugar-free" Products. This will help patient to have stable blood glucose profile and potentially avoid unintended weight gain.  - I encouraged her to switch to  unprocessed or minimally processed complex starch and increased protein intake (animal or plant source), fruits, and vegetables.  - she is advised to stick to a routine mealtimes to eat 3 meals  a day and avoid unnecessary snacks ( to snack only to correct hypoglycemia).   - she has been scheduled with Norm Salt, RDN, CDE for individualized diabetes education.  - I have approached her with the following individualized plan to manage diabetes and patient agrees:   -Based on her presentation with significantly above target glycemic profile averaging 245, she is an immediate candidate for basal insulin and was approached for same.   -  However, patient is very reluctant and would like to avoid/delay insulin treatment promising to do better on her diet and exercise.     -She is willing to  continue to monitor blood glucose at least twice a day-before breakfast and at bedtime. -She is advised to continue metformin 1000 mg p.o. twice daily.  I discussed and increased her glipizide 10 mg XL daily with breakfast.  - she will be considered for incretin therapy as appropriate next visit. - Patient specific target  A1c;  LDL, HDL, Triglycerides, and  Waist Circumference were discussed in detail.  2) BP/HTN: Her blood pressure is not controlled to  target.  She is advised to continue her current medications including lisinopril 40 mg p.o. daily, Lasix 40 mg p.o. daily, will be considered for additional treatment with hydrochlorothiazide on her next visit.   3) Lipids/HPL: Her recent lipid panel showed controlled LDL at 65.   She is advised to continue simvastatin 40 mg p.o. nightly.    4)  Weight/Diet: CDE Consult will be initiated , exercise, and detailed carbohydrates information provided.  5) Chronic Care/Health Maintenance:  -she  is on ACEI/ARB and Statin medications and  is encouraged to initiate and continue to follow up with Ophthalmology, Dentist,  Podiatrist at least yearly or according to recommendations, and advised to  stay away from smoking. I have recommended yearly flu vaccine and pneumonia vaccine at least every 5 years; moderate intensity exercise for up to 150 minutes weekly; and  sleep for at least 7 hours a day.  - I advised patient to maintain close follow up with Kari Baars, MD for primary care needs.  - Time spent with the patient: 25 min, of which >50% was spent in reviewing her blood glucose logs , discussing her hypoglycemia and hyperglycemia episodes, reviewing her current and  previous labs / studies and medications  doses and developing a plan to avoid hypoglycemia and hyperglycemia. Please refer to Patient Instructions for Blood Glucose Monitoring and Insulin/Medications Dosing Guide"  in media tab for additional information. Melissa Tucker participated  in the discussions, expressed understanding, and voiced agreement with the above plans.  All questions were answered to her satisfaction. she is encouraged to contact clinic should she have any questions or concerns prior to her return visit.   Follow up plan: - Return in about 9 weeks (around 08/12/2018) for Meter, and Logs.  Marquis Lunch, MD [facility] [address] [place], [place] [address] Phone:  [phone]  Fax: [phone]    06/10/2018, 2:36 PM  This note was partially dictated with voice recognition software. Similar sounding words can be transcribed inadequately or may not  be corrected upon review.

## 2018-06-10 NOTE — Patient Instructions (Signed)

## 2018-06-14 ENCOUNTER — Other Ambulatory Visit: Payer: Self-pay | Admitting: Pulmonary Disease

## 2018-06-14 DIAGNOSIS — G8929 Other chronic pain: Secondary | ICD-10-CM

## 2018-06-14 DIAGNOSIS — M545 Low back pain, unspecified: Secondary | ICD-10-CM

## 2018-06-18 ENCOUNTER — Ambulatory Visit (INDEPENDENT_AMBULATORY_CARE_PROVIDER_SITE_OTHER): Payer: Medicare Other | Admitting: Internal Medicine

## 2018-06-19 ENCOUNTER — Other Ambulatory Visit: Payer: Self-pay

## 2018-06-19 ENCOUNTER — Ambulatory Visit
Admission: RE | Admit: 2018-06-19 | Discharge: 2018-06-19 | Disposition: A | Discharge status: Home / Self Care | Payer: Medicare Other | Source: Ambulatory Visit | Attending: Pulmonary Disease | Admitting: Pulmonary Disease

## 2018-06-19 DIAGNOSIS — M545 Low back pain, unspecified: Secondary | ICD-10-CM

## 2018-06-19 DIAGNOSIS — G8929 Other chronic pain: Secondary | ICD-10-CM

## 2018-06-19 MED ORDER — IOPAMIDOL (ISOVUE-M 200) INJECTION 41%
1.0000 mL | Freq: Once | INTRAMUSCULAR | Status: AC
  Administered 2018-06-19: 1 mL via EPIDURAL

## 2018-06-19 MED ORDER — METHYLPREDNISOLONE ACETATE 40 MG/ML INJ SUSP (RADIOLOG
120.0000 mg | Freq: Once | INTRAMUSCULAR | Status: AC
  Administered 2018-06-19: 120 mg via EPIDURAL

## 2018-06-19 NOTE — Discharge Instructions (Signed)

## 2018-06-24 ENCOUNTER — Ambulatory Visit (INDEPENDENT_AMBULATORY_CARE_PROVIDER_SITE_OTHER): Payer: Medicare Other | Admitting: Internal Medicine

## 2018-06-25 ENCOUNTER — Encounter (INDEPENDENT_AMBULATORY_CARE_PROVIDER_SITE_OTHER): Payer: Self-pay | Admitting: Internal Medicine

## 2018-07-03 ENCOUNTER — Ambulatory Visit (INDEPENDENT_AMBULATORY_CARE_PROVIDER_SITE_OTHER): Payer: Medicare Other | Admitting: Internal Medicine

## 2018-08-12 ENCOUNTER — Ambulatory Visit: Payer: Medicare Other | Admitting: "Endocrinology

## 2018-08-14 ENCOUNTER — Ambulatory Visit (INDEPENDENT_AMBULATORY_CARE_PROVIDER_SITE_OTHER): Payer: Medicare Other | Admitting: "Endocrinology

## 2018-08-14 ENCOUNTER — Encounter: Payer: Self-pay | Admitting: "Endocrinology

## 2018-08-14 ENCOUNTER — Other Ambulatory Visit: Payer: Self-pay

## 2018-08-14 DIAGNOSIS — E782 Mixed hyperlipidemia: Secondary | ICD-10-CM | POA: Diagnosis not present

## 2018-08-14 DIAGNOSIS — E1165 Type 2 diabetes mellitus with hyperglycemia: Secondary | ICD-10-CM | POA: Diagnosis not present

## 2018-08-14 DIAGNOSIS — I10 Essential (primary) hypertension: Secondary | ICD-10-CM | POA: Diagnosis not present

## 2018-08-14 NOTE — Progress Notes (Signed)
08/14/2018, 10:59 AM                                                     Endocrinology Telehealth Visit Follow up Note -During COVID -19 Pandemic  This visit type was conducted due to national recommendations for restrictions regarding the COVID-19 Pandemic  in an effort to limit this patient's exposure and mitigate transmission of the corona virus.  Due to his co-morbid illnesses, Melissa Tucker is at  moderate to high risk for complications without adequate follow up.  This format is felt to be most appropriate for her at this time.  I connected with this patient on 08/14/2018   by telephone and verified that I am speaking with the correct person using two identifiers. Melissa Tucker, [birth year]. she has verbally consented to this visit. All issues noted in this document were discussed and addressed. The format was not optimal for physical exam.   Subjective:    Patient ID: Melissa Tucker, female    DOB: [birth year].  Melissa Tucker is being engaged in telehealth via telephone in follow-up for management of currently uncontrolled symptomatic diabetes requested by  Kari BaarsHawkins, Edward, MD.   Past Medical History:  Diagnosis Date  . Anxiety and depression   . Chest tightness   . Diabetes mellitus 1994   No insulin  . DJD (degenerative joint disease)   . History of DVT (deep vein thrombosis) 1989   Following childbirth  . Hyperlipidemia   . Hypertension   . IBS (irritable bowel syndrome)    Past Surgical History:  Procedure Laterality Date  . ABDOMINAL HYSTERECTOMY    . CESAREAN SECTION  1988  . CHOLECYSTECTOMY    . COLONOSCOPY  2005   Normal findings  . SHOULDER SURGERY     Left shoulder manipulation with subcrominal decompression, capsulitis  . TOTAL KNEE ARTHROPLASTY  2003   Left  . TOTAL KNEE ARTHROPLASTY  2007   Right   Social History   Socioeconomic History  . Marital status: Divorced    Spouse name: Not on file  . Number of children: Not on file  .  Years of education: Not on file  . Highest education level: Not on file  Occupational History  . Occupation: disabled    Associate Professormployer: UNEMPLOYED  Social Needs  . Financial resource strain: Not on file  . Food insecurity:    Worry: Not on file    Inability: Not on file  . Transportation needs:    Medical: Not on file    Non-medical: Not on file  Tobacco Use  . Smoking status: Former Smoker    Last attempt to quit: 04/10/1988    Years since quitting: 30.3  . Smokeless tobacco: Former NeurosurgeonUser  . Tobacco comment: Minimal prior use  Substance and Sexual Activity  . Alcohol use: No  . Drug use: No  . Sexual activity: Yes  Lifestyle  . Physical activity:    Days per week: Not on file    Minutes per session: Not on file  . Stress: Not on file  Relationships  . Social connections:    Talks on phone: Not on file    Gets together: Not on file    Attends religious service: Not on file    Active  member of club or organization: Not on file    Attends meetings of clubs or organizations: Not on file    Relationship status: Not on file  Other Topics Concern  . Not on file  Social History Narrative   Divorced   Resides with daughter, also has 2 sons   No regular exercise   Outpatient Encounter Medications as of 08/14/2018  Medication Sig  . ALPRAZolam (XANAX) 0.5 MG tablet Take 0.5 mg by mouth 3 (three) times daily.    Marland Kitchen aspirin 325 MG EC tablet Take 325 mg by mouth daily.    Marland Kitchen dicyclomine (BENTYL) 10 MG capsule Take 10 mg by mouth daily.    . furosemide (LASIX) 40 MG tablet Take 40 mg by mouth daily.    Marland Kitchen glipiZIDE (GLUCOTROL XL) 10 MG 24 hr tablet Take 1 tablet (10 mg total) by mouth daily with breakfast.  . Hydrocodone-Acetaminophen 10-660 MG TABS Take by mouth as needed.    . INDOMETHACIN PO Take by mouth.  Marland Kitchen lisinopril (PRINIVIL,ZESTRIL) 40 MG tablet Take 40 mg by mouth daily.    . metFORMIN (GLUCOPHAGE) 1000 MG tablet TAKE ONE TABLET BY MOUTH TWICE A DAY WITH A MEAL  . simvastatin  (ZOCOR) 40 MG tablet Take 40 mg by mouth at bedtime.    Marland Kitchen venlafaxine XR (EFFEXOR-XR) 37.5 MG 24 hr capsule TAKE 2 CAPSULES BY MOUTH DAILY.   No facility-administered encounter medications on file as of 08/14/2018.     ALLERGIES: Allergies  Allergen Reactions  . Peanut-Containing Drug Products Swelling    VACCINATION STATUS:  There is no immunization history on file for this patient.  Diabetes  She presents for her follow-up diabetic visit. She has type 2 diabetes mellitus. Onset time: She was diagnosed at approximate age of 41 years. There are no hypoglycemic associated symptoms. Pertinent negatives for hypoglycemia include no confusion, headaches, pallor or seizures. Associated symptoms include blurred vision, fatigue, polydipsia and polyuria. Pertinent negatives for diabetes include no chest pain and no polyphagia. There are no hypoglycemic complications. Symptoms are worsening. Diabetic complications include retinopathy. Risk factors for coronary artery disease include diabetes mellitus, dyslipidemia, family history, hypertension, obesity, sedentary lifestyle, post-menopausal and tobacco exposure. Current diabetic treatments: She is currently on Invokana 300 mg p.o. daily metformin/glipizide,\ She is compliant with treatment some of the time. Her weight is fluctuating minimally. She is following a generally unhealthy diet. When asked about meal planning, she reported none. She has not had a previous visit with a dietitian. She never participates in exercise. Her breakfast blood glucose range is generally >200 mg/dl. Her bedtime blood glucose range is generally >200 mg/dl. Her overall blood glucose range is >200 mg/dl. (  ) An ACE inhibitor/angiotensin II receptor blocker is being taken. Eye exam is current.  Hyperlipidemia  This is a chronic problem. The current episode started more than 1 year ago. The problem is controlled. Exacerbating diseases include diabetes and obesity. Pertinent  negatives include no chest pain, myalgias or shortness of breath. Current antihyperlipidemic treatment includes statins and ezetimibe. Risk factors for coronary artery disease include dyslipidemia, diabetes mellitus, family history, obesity, hypertension, a sedentary lifestyle and post-menopausal.  Hypertension  This is a chronic problem. The current episode started more than 1 year ago. The problem is uncontrolled. Associated symptoms include blurred vision. Pertinent negatives include no chest pain, headaches, palpitations or shortness of breath. Risk factors for coronary artery disease include diabetes mellitus, dyslipidemia, family history, obesity, post-menopausal state, sedentary lifestyle and smoking/tobacco exposure. Past  treatments include ACE inhibitors. Hypertensive end-organ damage includes retinopathy.   Review of system: Limited as above.   Objective:    There were no vitals taken for this visit.  Wt Readings from Last 3 Encounters:  06/10/18 211 lb 12.8 oz (96.1 kg)  01/14/18 200 lb (90.7 kg)  11/13/17 205 lb (93 kg)      CMP     Component Value Date/Time   NA 137 05/15/2018 1129   K 4.5 05/15/2018 1129   CL 99 05/15/2018 1129   CO2 28 05/15/2018 1129   GLUCOSE 283 (H) 05/15/2018 1129   BUN 16 05/15/2018 1129   BUN 11 09/07/2017   CREATININE 0.56 05/15/2018 1129   CALCIUM 9.7 05/15/2018 1129   PROT 6.8 05/15/2018 1129   ALBUMIN 4.7 09/13/2009   AST 14 05/15/2018 1129   ALT 15 05/15/2018 1129   ALKPHOS 100 09/13/2009   BILITOT 0.5 05/15/2018 1129   GFRNONAA 97 05/15/2018 1129   GFRAA 113 05/15/2018 1129     Diabetic Labs (most recent): Lab Results  Component Value Date   HGBA1C 10.0 (H) 05/15/2018   HGBA1C 9.6 (H) 01/11/2018   HGBA1C 9.6 09/07/2017     Lipid Panel ( most recent) Lipid Panel     Component Value Date/Time   CHOL 136 09/07/2017   TRIG 116 09/07/2017   HDL 51 09/07/2017   LDLCALC 65 09/07/2017      Assessment & Plan:   1.  Uncontrolled type 2 diabetes mellitus with hyperglycemia (HCC)  - Melissa Tucker has currently uncontrolled symptomatic type 2 DM since 68 years of age,  with most recent A1c of  10 %. Recent labs reviewed. -She did not do her previsit labs, reports blood glucose readings range from 221-270 in the morning and 245 to 275 mg per DL in the evening.    -her diabetes is complicated by retinopathy, obesity/sedentary life, history of smoking and Melissa Tucker remains at a high risk for more acute and chronic complications which include CAD, CVA, CKD, retinopathy, and neuropathy. These are all discussed in detail with the patient.  - I have counseled her on diet management and weight loss, by adopting a carbohydrate restricted/protein rich diet.  - Patient admits there is a room for improvement in her diet and drink choices. -  Suggestion is made for her to avoid simple carbohydrates  from her diet including Cakes, Sweet Desserts / Pastries, Ice Cream, Soda (diet and regular), Sweet Tea, Candies, Chips, Cookies, Store Bought Juices, Alcohol in Excess of  1-2 drinks a day, Artificial Sweeteners, and "Sugar-free" Products. This will help patient to have stable blood glucose profile and potentially avoid unintended weight gain.  - I encouraged her to switch to  unprocessed or minimally processed complex starch and increased protein intake (animal or plant source), fruits, and vegetables.  - she is advised to stick to a routine mealtimes to eat 3 meals  a day and avoid unnecessary snacks ( to snack only to correct hypoglycemia).   - she has been scheduled with Norm Salt, RDN, CDE for individualized diabetes education.  - I have approached her with the following individualized plan to manage diabetes and patient agrees:   -Based on her report of blood glucose ranging from 221-275, she remains  an immediate candidate for basal insulin and was approached for same.  Has been reluctant to go on insulin on  several attempts in the previous visits. -She is advised to go for her repeat  labs, and will be put on visit in the office in 1 week for re-approach for insulin treatment.     -She is willing to  continue to monitor blood glucose at least twice a day-before breakfast and at bedtime. -She is due metformin 1000 mg p.o. twice daily, glipizide 10 mg XL daily with breakfast.    - she will be considered for incretin therapy as appropriate next visit.   2) BP/HTN:she is advised to home monitor blood pressure and report if > 140/90 on 2 separate readings.  She is advised to continue her current medications including lisinopril 40 mg p.o. daily, Lasix 40 mg p.o. daily, will be considered for additional treatment with hydrochlorothiazide on her next visit.   3) Lipids/HPL: Her recent lipid panel showed controlled LDL at 65.   She is advised to continue simvastatin 40 mg p.o. nightly.    4)  Weight/Diet: CDE Consult will be initiated , exercise, and detailed carbohydrates information provided.  5) Chronic Care/Health Maintenance:  -she  is on ACEI/ARB and Statin medications and  is encouraged to initiate and continue to follow up with Ophthalmology, Dentist,  Podiatrist at least yearly or according to recommendations, and advised to  stay away from smoking. I have recommended yearly flu vaccine and pneumonia vaccine at least every 5 years; moderate intensity exercise for up to 150 minutes weekly; and  sleep for at least 7 hours a day.  - I advised patient to maintain close follow up with Kari Baars, MD for primary care needs.  - Patient Care Time Today:  25 min, of which >50% was spent in reviewing her  current and  previous labs/studies, previous treatments, and medications doses and developing a plan for long-term care based on the latest recommendations for standards of care.  Melissa Jericho participated in the discussions, expressed understanding, and voiced agreement with the above plans.  All  questions were answered to her satisfaction. she is encouraged to contact clinic should she have any questions or concerns prior to her return visit.  Follow up plan: - Return in about 1 week (around 08/21/2018), or will do labs today, office Visit in 1 week, for Follow up with Pre-visit Labs, Meter, and Logs.  Melissa Lunch, MD [facility] [address] [place], [place] [address] Phone: [phone]  Fax: [phone]    08/14/2018, 10:59 AM  This note was partially dictated with voice recognition software. Similar sounding words can be transcribed inadequately or may not  be corrected upon review.

## 2018-08-15 ENCOUNTER — Ambulatory Visit: Payer: Medicare Other | Admitting: "Endocrinology

## 2018-08-15 LAB — COMPLETE METABOLIC PANEL WITH GFR
AG Ratio: 2.2 (calc) (ref 1.0–2.5)
ALKALINE PHOSPHATASE (APISO): 148 U/L (ref 37–153)
ALT: 23 U/L (ref 6–29)
AST: 15 U/L (ref 10–35)
Albumin: 4.6 g/dL (ref 3.6–5.1)
BILIRUBIN TOTAL: 0.4 mg/dL (ref 0.2–1.2)
BUN: 16 mg/dL (ref 7–25)
CHLORIDE: 99 mmol/L (ref 98–110)
CO2: 29 mmol/L (ref 20–32)
Calcium: 10.1 mg/dL (ref 8.6–10.4)
Creat: 0.64 mg/dL (ref 0.50–0.99)
GFR, Est African American: 107 mL/min/{1.73_m2} (ref >=60)
GFR, Est Non African American: 92 mL/min/{1.73_m2} (ref >=60)
GLUCOSE: 424 mg/dL — AB (ref 65–99)
Globulin: 2.1 g/dL (calc) (ref 1.9–3.7)
Potassium: 4.9 mmol/L (ref 3.5–5.3)
Sodium: 136 mmol/L (ref 135–146)
Total Protein: 6.7 g/dL (ref 6.1–8.1)

## 2018-08-15 LAB — HEMOGLOBIN A1C
EAG (MMOL/L): 13.8 (calc)
HEMOGLOBIN A1C: 10.3 %{Hb} — AB (ref <5.7)
MEAN PLASMA GLUCOSE: 249 (calc)

## 2018-08-21 ENCOUNTER — Other Ambulatory Visit: Payer: Self-pay | Admitting: "Endocrinology

## 2018-09-20 ENCOUNTER — Other Ambulatory Visit: Payer: Self-pay | Admitting: "Endocrinology

## 2018-10-24 ENCOUNTER — Other Ambulatory Visit: Payer: Self-pay | Admitting: "Endocrinology

## 2018-11-01 ENCOUNTER — Encounter (HOSPITAL_COMMUNITY): Payer: Self-pay

## 2018-11-01 ENCOUNTER — Ambulatory Visit (HOSPITAL_COMMUNITY): Payer: Medicare Other | Attending: Pulmonary Disease | Admitting: Physical Therapy

## 2018-11-05 ENCOUNTER — Other Ambulatory Visit: Payer: Self-pay | Admitting: "Endocrinology

## 2018-11-28 ENCOUNTER — Other Ambulatory Visit (HOSPITAL_COMMUNITY): Payer: Self-pay | Admitting: Pulmonary Disease

## 2018-11-28 DIAGNOSIS — Z1231 Encounter for screening mammogram for malignant neoplasm of breast: Secondary | ICD-10-CM

## 2018-12-19 ENCOUNTER — Telehealth: Payer: Self-pay | Admitting: Obstetrics and Gynecology

## 2018-12-19 NOTE — Telephone Encounter (Signed)
Called patient regarding appointment and the following message was left: ° ° °We have you scheduled for an upcoming appointment at our office. At this time, patients are encouraged to come alone to their visits whenever possible, however, a support person, over age 67, may accompany you to your appointment if assistance is needed for safety or care concerns. Otherwise, support persons should remain outside until the visit is complete.  ° °We ask if you have had any exposure to anyone suspected or confirmed of having COVID-19 or if you are experiencing any of the following, to call and reschedule your appointment: fever, cough, shortness of breath, muscle pain, diarrhea, rash, vomiting, abdominal pain, red eye, weakness, bruising, bleeding, joint pain, or a severe headache.  ° °Please know we will ask you these questions or similar questions when you arrive for your appointment and again it’s how we are keeping everyone safe.   ° °Also,to keep you safe, please use the provided hand sanitizer when you enter the office. We are asking everyone in the office to wear a mask to help prevent the spread of °germs. If you have a mask of your own, please wear it to your appointment, if not, we are happy to provide one for you. ° °Thank you for understanding and your cooperation.  ° ° °[facility] Staff ° ° ° ° ° °

## 2018-12-20 ENCOUNTER — Ambulatory Visit: Payer: Medicare Other | Admitting: Obstetrics and Gynecology

## 2018-12-23 ENCOUNTER — Telehealth: Payer: Self-pay | Admitting: Internal Medicine

## 2018-12-23 NOTE — Telephone Encounter (Signed)
We have you scheduled for an upcoming appointment at our office. At this time, patients are encouraged to come alone to their visits whenever possible, however, a support person, over age 67, may accompany you to your appointment if assistance is needed for safety or care concerns. Otherwise, support persons should remain outside until the visit is complete.  ° °We ask if you have had any exposure to anyone suspected or confirmed of having COVID-19 or if you are experiencing any of the following, to call and reschedule your appointment: fever, cough, shortness of breath, muscle pain, diarrhea, rash, vomiting, abdominal pain, red eye, weakness, bruising, bleeding, joint pain, or a severe headache.  ° °Please know we will ask you these questions or similar questions when you arrive for your appointment and again it’s how we are keeping everyone safe.   ° °Also,to keep you safe, please use the provided hand sanitizer when you enter the office. We are asking everyone in the office to wear a mask to help prevent the spread of °germs. If you have a mask of your own, please wear it to your appointment, if not, we are happy to provide one for you. ° °Thank you for understanding and your cooperation.  ° ° °[facility] Staff °

## 2018-12-24 ENCOUNTER — Ambulatory Visit: Payer: Medicare Other | Admitting: Internal Medicine

## 2018-12-24 ENCOUNTER — Telehealth: Payer: Self-pay | Admitting: *Deleted

## 2018-12-24 NOTE — Telephone Encounter (Signed)
Called patient back and left message that she needs to come in for a visit to have her concern addressed. Asked her to call front desk and ask to schedule appointment.

## 2018-12-24 NOTE — Telephone Encounter (Signed)
Patient states she has been taking some antibiotics and now has a yeast infection. She is also complaining of a "sore" on one side of her labia.

## 2018-12-26 ENCOUNTER — Telehealth: Payer: Self-pay | Admitting: "Endocrinology

## 2018-12-26 NOTE — Telephone Encounter (Signed)
She can do A1c here.

## 2018-12-26 NOTE — Telephone Encounter (Signed)
Does this patient need labs? Has not been here since march

## 2018-12-28 ENCOUNTER — Other Ambulatory Visit: Payer: Self-pay | Admitting: "Endocrinology

## 2018-12-31 ENCOUNTER — Other Ambulatory Visit: Payer: Self-pay | Admitting: "Endocrinology

## 2019-01-02 ENCOUNTER — Ambulatory Visit: Payer: Medicare Other | Admitting: "Endocrinology

## 2019-01-09 ENCOUNTER — Ambulatory Visit: Payer: Medicare Other | Admitting: "Endocrinology

## 2019-01-14 ENCOUNTER — Ambulatory Visit: Payer: Medicare Other | Admitting: "Endocrinology

## 2019-01-22 ENCOUNTER — Ambulatory Visit: Payer: Medicare Other | Admitting: "Endocrinology

## 2019-02-04 ENCOUNTER — Other Ambulatory Visit: Payer: Self-pay | Admitting: "Endocrinology

## 2019-02-05 ENCOUNTER — Telehealth: Payer: Self-pay | Admitting: "Endocrinology

## 2019-02-06 ENCOUNTER — Other Ambulatory Visit: Payer: Self-pay | Admitting: "Endocrinology

## 2019-02-06 MED ORDER — METFORMIN HCL 1000 MG PO TABS: ORAL_TABLET | ORAL | 0 refills | Status: DC

## 2019-02-06 NOTE — Telephone Encounter (Signed)
Patient is requesting a refill on her Metformin. She said she has not been coming to her appts due to her back. I scheduled her for Monday at 9:30 but she said she may or may not keep it due to her IBS and her back. She is due for A1C

## 2019-02-06 NOTE — Telephone Encounter (Signed)
Done to Hana.

## 2019-02-10 ENCOUNTER — Ambulatory Visit (INDEPENDENT_AMBULATORY_CARE_PROVIDER_SITE_OTHER): Payer: Medicare Other | Admitting: "Endocrinology

## 2019-02-10 ENCOUNTER — Other Ambulatory Visit: Payer: Self-pay

## 2019-02-10 ENCOUNTER — Encounter: Payer: Self-pay | Admitting: "Endocrinology

## 2019-02-10 VITALS — BP 150/76 | HR 64 | Ht 69.0 in | Wt 203.0 lb

## 2019-02-10 DIAGNOSIS — E1165 Type 2 diabetes mellitus with hyperglycemia: Secondary | ICD-10-CM | POA: Diagnosis not present

## 2019-02-10 LAB — POCT GLYCOSYLATED HEMOGLOBIN (HGB A1C): Hemoglobin A1C: 9.6 % — AB (ref 4.0–5.6)

## 2019-02-10 MED ORDER — ACCU-CHEK GUIDE VI STRP: ORAL_STRIP | 2 refills | Status: DC

## 2019-02-10 MED ORDER — ACCU-CHEK GUIDE W/DEVICE KIT: 1.0000 | PACK | 0 refills | Status: DC

## 2019-02-10 MED ORDER — TRULICITY 0.75 MG/0.5ML ~~LOC~~ SOAJ: 0.7500 mg | SUBCUTANEOUS | 2 refills | Status: DC

## 2019-02-10 NOTE — Patient Instructions (Signed)

## 2019-02-10 NOTE — Progress Notes (Signed)
02/10/2019, 12:12 PM        Endocrinology follow-up note   Subjective:    Patient ID: Melissa Tucker, female    DOB: Jun 08, 1950.  Melissa Tucker is seen in follow-up for management of currently uncontrolled symptomatic diabetes requested by  Sinda Du, MD.   Past Medical History:  Diagnosis Date  . Anxiety and depression   . Chest tightness   . Diabetes mellitus 1994   No insulin  . DJD (degenerative joint disease)   . History of DVT (deep vein thrombosis) 1989   Following childbirth  . Hyperlipidemia   . Hypertension   . IBS (irritable bowel syndrome)    Past Surgical History:  Procedure Laterality Date  . ABDOMINAL HYSTERECTOMY    . CESAREAN SECTION  1988  . CHOLECYSTECTOMY    . COLONOSCOPY  2005   Normal findings  . SHOULDER SURGERY     Left shoulder manipulation with subcrominal decompression, capsulitis  . TOTAL KNEE ARTHROPLASTY  2003   Left  . TOTAL KNEE ARTHROPLASTY  2007   Right   Social History   Socioeconomic History  . Marital status: Divorced    Spouse name: Not on file  . Number of children: Not on file  . Years of education: Not on file  . Highest education level: Not on file  Occupational History  . Occupation: disabled    Fish farm manager: UNEMPLOYED  Social Needs  . Financial resource strain: Not on file  . Food insecurity    Worry: Not on file    Inability: Not on file  . Transportation needs    Medical: Not on file    Non-medical: Not on file  Tobacco Use  . Smoking status: Former Smoker    Quit date: 04/10/1988    Years since quitting: 30.8  . Smokeless tobacco: Former Systems developer  . Tobacco comment: Minimal prior use  Substance and Sexual Activity  . Alcohol use: No  . Drug use: No  . Sexual activity: Yes  Lifestyle  . Physical activity    Days per week: Not on file    Minutes per session: Not on file  . Stress: Not on file  Relationships  . Social Herbalist on phone: Not on file    Gets  together: Not on file    Attends religious service: Not on file    Active member of club or organization: Not on file    Attends meetings of clubs or organizations: Not on file    Relationship status: Not on file  Other Topics Concern  . Not on file  Social History Narrative   Divorced   Resides with daughter, also has 2 sons   No regular exercise   Outpatient Encounter Medications as of 02/10/2019  Medication Sig  . ALPRAZolam (XANAX) 0.5 MG tablet Take 0.5 mg by mouth 3 (three) times daily.    Marland Kitchen aspirin 325 MG EC tablet Take 325 mg by mouth daily.    . Blood Glucose Monitoring Suppl (ACCU-CHEK GUIDE) w/Device KIT 1 Piece by Does not apply route as directed.  . dicyclomine (BENTYL) 10 MG capsule Take 10 mg by mouth daily.    . Dulaglutide (TRULICITY) 0.09 QZ/3.0QT SOPN Inject 0.75 mg into the skin once a week.  . furosemide (LASIX) 40 MG tablet Take 40 mg by mouth daily.    Marland Kitchen GLIPIZIDE XL 10 MG 24 hr tablet TAKE 1 TABLET BY MOUTH  ONCE A DAY.  Marland Kitchen glucose blood (ACCU-CHEK GUIDE) test strip Use as instructed  . Hydrocodone-Acetaminophen 10-660 MG TABS Take by mouth as needed.    . INDOMETHACIN PO Take by mouth.  Marland Kitchen lisinopril (PRINIVIL,ZESTRIL) 40 MG tablet Take 40 mg by mouth daily.    . metFORMIN (GLUCOPHAGE) 1000 MG tablet TAKE ONE TABLET BY MOUTH TWICE A DAY WITH A MEAL  . simvastatin (ZOCOR) 40 MG tablet Take 40 mg by mouth at bedtime.    Marland Kitchen venlafaxine XR (EFFEXOR-XR) 37.5 MG 24 hr capsule TAKE 2 CAPSULES BY MOUTH DAILY.   No facility-administered encounter medications on file as of 02/10/2019.     ALLERGIES: Allergies  Allergen Reactions  . Peanut-Containing Drug Products Swelling    VACCINATION STATUS:  There is no immunization history on file for this patient.  Diabetes She presents for her follow-up diabetic visit. She has type 2 diabetes mellitus. Onset time: She was diagnosed at approximate age of 7 years. Her disease course has been worsening. There are no  hypoglycemic associated symptoms. Pertinent negatives for hypoglycemia include no confusion, headaches, pallor or seizures. Associated symptoms include blurred vision, fatigue, polydipsia and polyuria. Pertinent negatives for diabetes include no chest pain and no polyphagia. There are no hypoglycemic complications. Symptoms are worsening. Diabetic complications include retinopathy. Risk factors for coronary artery disease include diabetes mellitus, dyslipidemia, family history, hypertension, obesity, sedentary lifestyle, post-menopausal and tobacco exposure. Current diabetic treatments: She is currently on Invokana 300 mg p.o. daily metformin/glipizide,\ She is compliant with treatment some of the time. Her weight is fluctuating minimally. She is following a generally unhealthy diet. When asked about meal planning, she reported none. She has not had a previous visit with a dietitian. She never participates in exercise. Her overall blood glucose range is >200 mg/dl. (She comes with a meter which has a rare and random glycemic profile and it averaging greater than 200 mg per DL.  Her point-of-care A1c is 9.6% today.) An ACE inhibitor/angiotensin II receptor blocker is being taken. Eye exam is current.  Hyperlipidemia This is a chronic problem. The current episode started more than 1 year ago. The problem is controlled. Exacerbating diseases include diabetes and obesity. Pertinent negatives include no chest pain, myalgias or shortness of breath. Current antihyperlipidemic treatment includes statins and ezetimibe. Risk factors for coronary artery disease include dyslipidemia, diabetes mellitus, family history, obesity, hypertension, a sedentary lifestyle and post-menopausal.  Hypertension This is a chronic problem. The current episode started more than 1 year ago. The problem is uncontrolled. Associated symptoms include blurred vision. Pertinent negatives include no chest pain, headaches, palpitations or shortness  of breath. Risk factors for coronary artery disease include diabetes mellitus, dyslipidemia, family history, obesity, post-menopausal state, sedentary lifestyle and smoking/tobacco exposure. Past treatments include ACE inhibitors. Hypertensive end-organ damage includes retinopathy.   Review of system: Limited as above.   Objective:    BP (!) 150/76   Pulse 64   Ht '5\' 9"'  (1.753 m)   Wt 203 lb (92.1 kg)   BMI 29.98 kg/m   Wt Readings from Last 3 Encounters:  02/10/19 203 lb (92.1 kg)  06/10/18 211 lb 12.8 oz (96.1 kg)  01/14/18 200 lb (90.7 kg)      CMP     Component Value Date/Time   NA 136 08/14/2018 1142   K 4.9 08/14/2018 1142   CL 99 08/14/2018 1142   CO2 29 08/14/2018 1142   GLUCOSE 424 (H) 08/14/2018 1142   BUN 16 08/14/2018 1142  BUN 11 09/07/2017   CREATININE 0.64 08/14/2018 1142   CALCIUM 10.1 08/14/2018 1142   PROT 6.7 08/14/2018 1142   ALBUMIN 4.7 09/13/2009   AST 15 08/14/2018 1142   ALT 23 08/14/2018 1142   ALKPHOS 100 09/13/2009   BILITOT 0.4 08/14/2018 1142   GFRNONAA 92 08/14/2018 1142   GFRAA 107 08/14/2018 1142     Diabetic Labs (most recent): Lab Results  Component Value Date   HGBA1C 9.6 (A) 02/10/2019   HGBA1C 10.3 (H) 08/14/2018   HGBA1C 10.0 (H) 05/15/2018     Lipid Panel ( most recent) Lipid Panel     Component Value Date/Time   CHOL 136 09/07/2017   TRIG 116 09/07/2017   HDL 51 09/07/2017   LDLCALC 65 09/07/2017      Assessment & Plan:   1. Uncontrolled type 2 diabetes mellitus with hyperglycemia ([place])  - Melissa Tucker has currently uncontrolled symptomatic type 2 DM since 68 years of age,  with most recent A1c of 9.6%.  Her meter showed random and rare glycemic profile averaging greater than 200 mg per DL.  per DL in the evening.    -her diabetes is complicated by retinopathy, obesity/sedentary life, history of smoking and Melissa Tucker remains at a high risk for more acute and chronic complications which include CAD, CVA,  CKD, retinopathy, and neuropathy. These are all discussed in detail with the patient.  - I have counseled her on diet management and weight loss, by adopting a carbohydrate restricted/protein rich diet.  - she  admits there is a room for improvement in her diet and drink choices. -  Suggestion is made for her to avoid simple carbohydrates  from her diet including Cakes, Sweet Desserts / Pastries, Ice Cream, Soda (diet and regular), Sweet Tea, Candies, Chips, Cookies, Sweet Pastries,  Store Bought Juices, Alcohol in Excess of  1-2 drinks a day, Artificial Sweeteners, Coffee Creamer, and "Sugar-free" Products. This will help patient to have stable blood glucose profile and potentially avoid unintended weight gain.   - I encouraged her to switch to  unprocessed or minimally processed complex starch and increased protein intake (animal or plant source), fruits, and vegetables.  - she is advised to stick to a routine mealtimes to eat 3 meals  a day and avoid unnecessary snacks ( to snack only to correct hypoglycemia).   - she has been scheduled with Jearld Fenton, RDN, CDE for individualized diabetes education.  - I have approached her with the following individualized plan to manage diabetes and patient agrees:   -Based on her presentation with still significantly above target glycemic profile and A1c of 9.6%, she was reapproached for insulin treatment and unfortunately declines again.   -Instead, she is requesting Trulicity prescription.  It is low this prescription may help, and will not give her the controlled she needs in the recommended duration of time.    -I discussed initiated Trulicity 8.91 mg subcutaneously weekly.  She agrees to continue monitoring blood glucose at least twice a day-daily before breakfast and at bedtime.    -She is encouraged to call clinic for blood glucose readings less than 70 or greater than 200x3.     -She is willing to continue metformin 1000 mg p.o. 3 times  daily, glipizide 10 mg XL p.o. daily with breakfast.     2) BP/HTN: Her blood pressure is not controlled to target.  She is advised to continue her current medications including lisinopril 40 mg p.o. daily, Lasix  40 mg p.o. daily, will be considered for additional treatment with hydrochlorothiazide on her next visit.   3) Lipids/HPL: Her recent lipid panel showed controlled LDL at 65.   She is advised to continue simvastatin 40 mg p.o. nightly.      4)  Weight/Diet: CDE Consult will be initiated , exercise, and detailed carbohydrates information provided.  5) Chronic Care/Health Maintenance:  -she  is on ACEI/ARB and Statin medications and  is encouraged to initiate and continue to follow up with Ophthalmology, Dentist,  Podiatrist at least yearly or according to recommendations, and advised to  stay away from smoking. I have recommended yearly flu vaccine and pneumonia vaccine at least every 5 years; moderate intensity exercise for up to 150 minutes weekly; and  sleep for at least 7 hours a day.  - I advised patient to maintain close follow up with Sinda Du, MD for primary care needs.  - Patient Care Time Today:  25 min, of which >50% was spent in  counseling and the rest reviewing her  current and  previous labs/studies, previous treatments, her blood glucose readings, and medications' doses and developing a plan for long-term care based on the latest recommendations for standards of care.   Melissa Tucker participated in the discussions, expressed understanding, and voiced agreement with the above plans.  All questions were answered to her satisfaction. she is encouraged to contact clinic should she have any questions or concerns prior to her return visit.  Follow up plan: - Return in about 3 months (around 05/13/2019) for Bring Meter and Logs- A1c in Office.  Glade Lloyd, MD [facility] [address] [place], [place]  [address] Phone: [phone]  Fax: [phone]    02/10/2019, 12:12 PM  This note was partially dictated with voice recognition software. Similar sounding words can be transcribed inadequately or may not  be corrected upon review.

## 2019-02-12 ENCOUNTER — Ambulatory Visit (HOSPITAL_COMMUNITY): Payer: Medicare Other

## 2019-03-12 ENCOUNTER — Other Ambulatory Visit: Payer: Self-pay | Admitting: "Endocrinology

## 2019-03-18 ENCOUNTER — Encounter (INDEPENDENT_AMBULATORY_CARE_PROVIDER_SITE_OTHER): Payer: Self-pay | Admitting: *Deleted

## 2019-03-21 ENCOUNTER — Other Ambulatory Visit: Payer: Self-pay | Admitting: "Endocrinology

## 2019-04-07 ENCOUNTER — Other Ambulatory Visit: Payer: Self-pay | Admitting: "Endocrinology

## 2019-04-07 ENCOUNTER — Other Ambulatory Visit (HOSPITAL_COMMUNITY): Payer: Self-pay | Admitting: Internal Medicine

## 2019-04-07 DIAGNOSIS — Z1231 Encounter for screening mammogram for malignant neoplasm of breast: Secondary | ICD-10-CM

## 2019-04-16 ENCOUNTER — Ambulatory Visit (HOSPITAL_COMMUNITY): Payer: Medicare Other

## 2019-04-24 ENCOUNTER — Ambulatory Visit (HOSPITAL_COMMUNITY)
Admission: RE | Admit: 2019-04-24 | Discharge: 2019-04-24 | Disposition: A | Discharge status: Home / Self Care | Payer: Medicare Other | Source: Ambulatory Visit | Attending: Internal Medicine | Admitting: Internal Medicine

## 2019-04-24 ENCOUNTER — Other Ambulatory Visit: Payer: Self-pay

## 2019-04-24 DIAGNOSIS — Z1231 Encounter for screening mammogram for malignant neoplasm of breast: Secondary | ICD-10-CM | POA: Diagnosis not present

## 2019-05-08 ENCOUNTER — Other Ambulatory Visit (HOSPITAL_COMMUNITY): Payer: Self-pay | Admitting: Internal Medicine

## 2019-05-08 ENCOUNTER — Other Ambulatory Visit: Payer: Self-pay | Admitting: Internal Medicine

## 2019-05-08 DIAGNOSIS — R945 Abnormal results of liver function studies: Secondary | ICD-10-CM

## 2019-05-08 DIAGNOSIS — R7989 Other specified abnormal findings of blood chemistry: Secondary | ICD-10-CM

## 2019-05-13 ENCOUNTER — Ambulatory Visit: Payer: Medicare Other | Admitting: "Endocrinology

## 2019-05-15 ENCOUNTER — Encounter (HOSPITAL_COMMUNITY): Payer: Self-pay | Admitting: Emergency Medicine

## 2019-05-15 ENCOUNTER — Inpatient Hospital Stay (HOSPITAL_COMMUNITY)
Admission: EM | Admit: 2019-05-15 | Discharge: 2019-05-18 | DRG: 481 | Disposition: A | Discharge status: Home Health | Payer: Medicare Other | Attending: Student | Admitting: Student

## 2019-05-15 ENCOUNTER — Emergency Department (HOSPITAL_COMMUNITY): Payer: Medicare Other

## 2019-05-15 ENCOUNTER — Other Ambulatory Visit: Payer: Self-pay

## 2019-05-15 DIAGNOSIS — Z87891 Personal history of nicotine dependence: Secondary | ICD-10-CM

## 2019-05-15 DIAGNOSIS — S72451A Displaced supracondylar fracture without intracondylar extension of lower end of right femur, initial encounter for closed fracture: Principal | ICD-10-CM | POA: Diagnosis present

## 2019-05-15 DIAGNOSIS — D649 Anemia, unspecified: Secondary | ICD-10-CM | POA: Diagnosis not present

## 2019-05-15 DIAGNOSIS — E782 Mixed hyperlipidemia: Secondary | ICD-10-CM | POA: Diagnosis not present

## 2019-05-15 DIAGNOSIS — Z90710 Acquired absence of both cervix and uterus: Secondary | ICD-10-CM

## 2019-05-15 DIAGNOSIS — M9711XA Periprosthetic fracture around internal prosthetic right knee joint, initial encounter: Secondary | ICD-10-CM | POA: Diagnosis present

## 2019-05-15 DIAGNOSIS — Z79891 Long term (current) use of opiate analgesic: Secondary | ICD-10-CM | POA: Diagnosis not present

## 2019-05-15 DIAGNOSIS — Z20822 Contact with and (suspected) exposure to covid-19: Secondary | ICD-10-CM | POA: Diagnosis present

## 2019-05-15 DIAGNOSIS — S7290XA Unspecified fracture of unspecified femur, initial encounter for closed fracture: Secondary | ICD-10-CM | POA: Diagnosis present

## 2019-05-15 DIAGNOSIS — Y9389 Activity, other specified: Secondary | ICD-10-CM | POA: Diagnosis not present

## 2019-05-15 DIAGNOSIS — Z9049 Acquired absence of other specified parts of digestive tract: Secondary | ICD-10-CM | POA: Diagnosis not present

## 2019-05-15 DIAGNOSIS — Y92009 Unspecified place in unspecified non-institutional (private) residence as the place of occurrence of the external cause: Secondary | ICD-10-CM | POA: Diagnosis not present

## 2019-05-15 DIAGNOSIS — Z8249 Family history of ischemic heart disease and other diseases of the circulatory system: Secondary | ICD-10-CM

## 2019-05-15 DIAGNOSIS — E1165 Type 2 diabetes mellitus with hyperglycemia: Secondary | ICD-10-CM | POA: Diagnosis not present

## 2019-05-15 DIAGNOSIS — S72401A Unspecified fracture of lower end of right femur, initial encounter for closed fracture: Secondary | ICD-10-CM | POA: Diagnosis present

## 2019-05-15 DIAGNOSIS — Z91010 Allergy to peanuts: Secondary | ICD-10-CM

## 2019-05-15 DIAGNOSIS — Z419 Encounter for procedure for purposes other than remedying health state, unspecified: Secondary | ICD-10-CM

## 2019-05-15 DIAGNOSIS — Z800 Family history of malignant neoplasm of digestive organs: Secondary | ICD-10-CM

## 2019-05-15 DIAGNOSIS — Z79899 Other long term (current) drug therapy: Secondary | ICD-10-CM | POA: Diagnosis not present

## 2019-05-15 DIAGNOSIS — S72351A Displaced comminuted fracture of shaft of right femur, initial encounter for closed fracture: Secondary | ICD-10-CM

## 2019-05-15 DIAGNOSIS — T148XXA Other injury of unspecified body region, initial encounter: Secondary | ICD-10-CM

## 2019-05-15 DIAGNOSIS — F329 Major depressive disorder, single episode, unspecified: Secondary | ICD-10-CM | POA: Diagnosis present

## 2019-05-15 DIAGNOSIS — S82851A Displaced trimalleolar fracture of right lower leg, initial encounter for closed fracture: Secondary | ICD-10-CM

## 2019-05-15 DIAGNOSIS — Z86718 Personal history of other venous thrombosis and embolism: Secondary | ICD-10-CM | POA: Diagnosis not present

## 2019-05-15 DIAGNOSIS — Z8049 Family history of malignant neoplasm of other genital organs: Secondary | ICD-10-CM

## 2019-05-15 DIAGNOSIS — Z7982 Long term (current) use of aspirin: Secondary | ICD-10-CM | POA: Diagnosis not present

## 2019-05-15 DIAGNOSIS — Z7984 Long term (current) use of oral hypoglycemic drugs: Secondary | ICD-10-CM

## 2019-05-15 DIAGNOSIS — I10 Essential (primary) hypertension: Secondary | ICD-10-CM | POA: Diagnosis present

## 2019-05-15 DIAGNOSIS — W010XXA Fall on same level from slipping, tripping and stumbling without subsequent striking against object, initial encounter: Secondary | ICD-10-CM | POA: Diagnosis present

## 2019-05-15 DIAGNOSIS — S82841A Displaced bimalleolar fracture of right lower leg, initial encounter for closed fracture: Secondary | ICD-10-CM

## 2019-05-15 DIAGNOSIS — Z96653 Presence of artificial knee joint, bilateral: Secondary | ICD-10-CM | POA: Diagnosis present

## 2019-05-15 DIAGNOSIS — K589 Irritable bowel syndrome without diarrhea: Secondary | ICD-10-CM | POA: Diagnosis present

## 2019-05-15 DIAGNOSIS — F419 Anxiety disorder, unspecified: Secondary | ICD-10-CM | POA: Diagnosis present

## 2019-05-15 DIAGNOSIS — R0902 Hypoxemia: Secondary | ICD-10-CM | POA: Diagnosis present

## 2019-05-15 LAB — CBC WITH DIFFERENTIAL/PLATELET
Abs Immature Granulocytes: 0.05 10*3/uL (ref 0.00–0.07)
Basophils Absolute: 0 10*3/uL (ref 0.0–0.1)
Basophils Relative: 0 %
Eosinophils Absolute: 0 10*3/uL (ref 0.0–0.5)
Eosinophils Relative: 0 %
HCT: 37 % (ref 36.0–46.0)
Hemoglobin: 11.7 g/dL — ABNORMAL LOW (ref 12.0–15.0)
Immature Granulocytes: 0 %
Lymphocytes Relative: 16 %
Lymphs Abs: 1.8 10*3/uL (ref 0.7–4.0)
MCH: 30.1 pg (ref 26.0–34.0)
MCHC: 31.6 g/dL (ref 30.0–36.0)
MCV: 95.1 fL (ref 80.0–100.0)
Monocytes Absolute: 0.5 10*3/uL (ref 0.1–1.0)
Monocytes Relative: 4 %
Neutro Abs: 9 10*3/uL — ABNORMAL HIGH (ref 1.7–7.7)
Neutrophils Relative %: 80 %
Platelets: 346 10*3/uL (ref 150–400)
RBC: 3.89 MIL/uL (ref 3.87–5.11)
RDW: 11.1 % — ABNORMAL LOW (ref 11.5–15.5)
WBC: 11.4 10*3/uL — ABNORMAL HIGH (ref 4.0–10.5)
nRBC: 0 % (ref 0.0–0.2)

## 2019-05-15 LAB — BASIC METABOLIC PANEL
Anion gap: 10 (ref 5–15)
BUN: 26 mg/dL — ABNORMAL HIGH (ref 8–23)
CO2: 26 mmol/L (ref 22–32)
Calcium: 9.7 mg/dL (ref 8.9–10.3)
Chloride: 99 mmol/L (ref 98–111)
Creatinine, Ser: 0.49 mg/dL (ref 0.44–1.00)
GFR calc Af Amer: >60 mL/min (ref >60)
GFR calc non Af Amer: >60 mL/min (ref >60)
Glucose, Bld: 281 mg/dL — ABNORMAL HIGH (ref 70–99)
Potassium: 4.4 mmol/L (ref 3.5–5.1)
Sodium: 135 mmol/L (ref 135–145)

## 2019-05-15 MED ORDER — ENOXAPARIN SODIUM 40 MG/0.4ML ~~LOC~~ SOLN: 40.0000 mg | SUBCUTANEOUS | Status: DC

## 2019-05-15 MED ORDER — ONDANSETRON HCL 4 MG/2ML IJ SOLN: 4.0000 mg | Freq: Four times a day (QID) | INTRAMUSCULAR | Status: DC | PRN

## 2019-05-15 MED ORDER — SENNOSIDES-DOCUSATE SODIUM 8.6-50 MG PO TABS
1.0000 | ORAL_TABLET | Freq: Every evening | ORAL | Status: DC | PRN
  Filled 2019-05-15: qty 1

## 2019-05-15 MED ORDER — METHOCARBAMOL 500 MG PO TABS
500.0000 mg | ORAL_TABLET | Freq: Four times a day (QID) | ORAL | Status: DC | PRN
  Administered 2019-05-16 – 2019-05-17 (×5): 500 mg via ORAL
  Filled 2019-05-15 (×4): qty 1

## 2019-05-15 MED ORDER — MORPHINE SULFATE (PF) 4 MG/ML IV SOLN
4.0000 mg | Freq: Once | INTRAVENOUS | Status: AC
  Administered 2019-05-15: 16:00:00 4 mg via INTRAVENOUS
  Filled 2019-05-15: qty 1

## 2019-05-15 MED ORDER — MAGNESIUM CITRATE PO SOLN: 1.0000 | Freq: Once | ORAL | Status: DC | PRN

## 2019-05-15 MED ORDER — ACETAMINOPHEN 500 MG PO TABS
500.0000 mg | ORAL_TABLET | Freq: Four times a day (QID) | ORAL | Status: DC
  Administered 2019-05-15 – 2019-05-16 (×2): 500 mg via ORAL
  Filled 2019-05-15 (×2): qty 1

## 2019-05-15 MED ORDER — BISACODYL 5 MG PO TBEC: 5.0000 mg | DELAYED_RELEASE_TABLET | Freq: Every day | ORAL | Status: DC | PRN

## 2019-05-15 MED ORDER — HYDROMORPHONE HCL 1 MG/ML IJ SOLN
1.0000 mg | Freq: Once | INTRAMUSCULAR | Status: AC
  Administered 2019-05-15: 1 mg via INTRAVENOUS
  Filled 2019-05-15: qty 1

## 2019-05-15 MED ORDER — ONDANSETRON HCL 4 MG PO TABS: 4.0000 mg | ORAL_TABLET | Freq: Four times a day (QID) | ORAL | Status: DC | PRN

## 2019-05-15 MED ORDER — METHOCARBAMOL 1000 MG/10ML IJ SOLN: 500.0000 mg | Freq: Four times a day (QID) | INTRAVENOUS | Status: DC | PRN

## 2019-05-15 MED ORDER — MORPHINE SULFATE (PF) 2 MG/ML IV SOLN
0.5000 mg | INTRAVENOUS | Status: DC | PRN
  Administered 2019-05-15 – 2019-05-16 (×6): 1 mg via INTRAVENOUS
  Filled 2019-05-15 (×6): qty 1

## 2019-05-15 MED ORDER — ACETAMINOPHEN 325 MG PO TABS: 325.0000 mg | ORAL_TABLET | Freq: Four times a day (QID) | ORAL | Status: DC | PRN

## 2019-05-15 MED ORDER — HYDROCODONE-ACETAMINOPHEN 7.5-325 MG PO TABS
1.0000 | ORAL_TABLET | ORAL | Status: DC | PRN
  Administered 2019-05-15 – 2019-05-16 (×2): 1 via ORAL
  Filled 2019-05-15 (×2): qty 1

## 2019-05-15 MED ORDER — ONDANSETRON HCL 4 MG/2ML IJ SOLN
4.0000 mg | Freq: Once | INTRAMUSCULAR | Status: AC
  Administered 2019-05-15: 4 mg via INTRAVENOUS
  Filled 2019-05-15: qty 2

## 2019-05-15 MED ORDER — ZOLPIDEM TARTRATE 5 MG PO TABS
5.0000 mg | ORAL_TABLET | Freq: Every evening | ORAL | Status: DC | PRN
  Administered 2019-05-15 – 2019-05-16 (×2): 5 mg via ORAL
  Filled 2019-05-15 (×2): qty 1

## 2019-05-15 MED ORDER — HYDROCODONE-ACETAMINOPHEN 5-325 MG PO TABS
1.0000 | ORAL_TABLET | ORAL | Status: DC | PRN
  Filled 2019-05-15: qty 2

## 2019-05-15 MED ORDER — DIPHENHYDRAMINE HCL 12.5 MG/5ML PO ELIX: 12.5000 mg | ORAL_SOLUTION | ORAL | Status: DC | PRN

## 2019-05-15 NOTE — Consult Note (Signed)
ORTHOPAEDIC CONSULTATION  REQUESTING PHYSICIAN: Teryl Lucy, MD  Chief Complaint: right knee pain, right ankle pain  HPI: Melissa Tucker is a 69 y.o. female who complains of right knee and ankle pain. She has a medical history postive for DM, HTN, IBS, hyperlipidemia, and previous DVT. History of bilateral knee replacements performed approximately 20 years ago by Dr. Sherlean Foot. Patient states she has had no knee problems since her knee replacements up until this point.  Patient was walking in her backyard to feed her pets today when she slipped on wet ground. Her right knee tucked beneath her, she felt a pop in her knee, and significant sharp pain in her knee and right ankle. She was worried that her knee replacement had dislodged and she was brought to the [facility] ED. She took a hydrocodone prior to arrival in the ED which did improve her pain.   Radiographs performed at [facility] showed a closed periprosthetic right femur fracture and right distal fibula and distal tibia fractures. Dr. Dion Saucier was consulted and patient was transferred to [facility] for surgical management. Right ankle was splinted in a posterior splint and right knee was secured in a knee immobilizer. Since coming to [facility] patient states pain has significantly improved with pain medication. She denies shortness of breath, chest pain, nausea, vomiting, abdominal pain, numbness and tingling.  Past Medical History:  Diagnosis Date  . Anxiety and depression   . Chest tightness   . Diabetes mellitus 1994   No insulin  . DJD (degenerative joint disease)   . History of DVT (deep vein thrombosis) 1989   Following childbirth  . Hyperlipidemia   . Hypertension   . IBS (irritable bowel syndrome)    Past Surgical History:  Procedure Laterality Date  . ABDOMINAL HYSTERECTOMY    . CESAREAN SECTION  1988  . CHOLECYSTECTOMY    . COLONOSCOPY  2005   Normal findings  . SHOULDER SURGERY     Left shoulder  manipulation with subcrominal decompression, capsulitis  . TOTAL KNEE ARTHROPLASTY  2003   Left  . TOTAL KNEE ARTHROPLASTY  2007   Right   Social History   Socioeconomic History  . Marital status: Divorced    Spouse name: Not on file  . Number of children: Not on file  . Years of education: Not on file  . Highest education level: Not on file  Occupational History  . Occupation: disabled    Associate Professor: UNEMPLOYED  Tobacco Use  . Smoking status: Former Smoker    Quit date: 04/10/1988    Years since quitting: 31.1  . Smokeless tobacco: Former Neurosurgeon  . Tobacco comment: Minimal prior use  Substance and Sexual Activity  . Alcohol use: No  . Drug use: No  . Sexual activity: Yes  Other Topics Concern  . Not on file  Social History Narrative   Divorced   Resides with daughter, also has 2 sons   No regular exercise   Social Determinants of Health   Financial Resource Strain:   . Difficulty of Paying Living Expenses: Not on file  Food Insecurity:   . Worried About Programme researcher, broadcasting/film/video in the Last Year: Not on file  . Ran Out of Food in the Last Year: Not on file  Transportation Needs:   . Lack of Transportation (Medical): Not on file  . Lack of Transportation (Non-Medical): Not on file  Physical Activity:   . Days of Exercise per Week: Not on file  .  Minutes of Exercise per Session: Not on file  Stress:   . Feeling of Stress : Not on file  Social Connections:   . Frequency of Communication with Friends and Family: Not on file  . Frequency of Social Gatherings with Friends and Family: Not on file  . Attends Religious Services: Not on file  . Active Member of Clubs or Organizations: Not on file  . Attends Archivist Meetings: Not on file  . Marital Status: Not on file   Family History  Problem Relation Age of Onset  . Heart disease Mother   . Hypertension Mother   . Cervical cancer Mother   . Esophageal cancer Father   . Hypertension Sister    Allergies   Allergen Reactions  . Peanut-Containing Drug Products Swelling     Positive ROS: All other systems have been reviewed and were otherwise negative with the exception of those mentioned in the HPI and as above.  Physical Exam: General: Alert, no acute distress. Sitting comfortably in bed. Cardiovascular: Normal rate and rhythm.  Respiratory: No cyanosis, no use of accessory musculature. Patient currently using nasal canula.  GI: No organomegaly, abdomen is soft and non-tender Skin: No lesions noted around round knee.  Neurologic: Sensation intact distally Psychiatric: Patient is competent for consent with normal mood and affect Lymphatic: No axillary or cervical lymphadenopathy  MUSCULOSKELETAL:  RLE: TTP at distal femur. Mild edema noted at distal femur. Compartments soft. Right ankle in posterior splint. Distal sensation and capillary refill are intact. Able to move all toes of right foot without pain.   Radiographs: Right knee: Comminuted, angulated, and displaced fracture of the distal right femur.  Right Ankle: There is a slightly displaced spiral fracture of the distal right fibular shaft centered 3.5 cm above the ankle joint. There is a transverse fracture through the medial malleolus without displacement. No dislocation.   Assessment/Plan: Active Problems:   Femur fracture (HCC)   Closed fracture of right femur (HCC)  Right periprosthetic femur fracture Right spiral fracture of distal fibula and transverse distal tibia fracture - ORIF of right ankle and right femur with Dr. Doreatha Martin tomorrow - will plan to keep posterior ankle splint and knee immobilizer until taken to the OR tomorrow - continue PRN pain medications  Ventura Bruns, PA-C    05/15/2019 10:34 PM

## 2019-05-15 NOTE — ED Provider Notes (Signed)
Received patient at signout from [facility].  Refer to provider note for full history and physical examination.  Briefly, patient is a 69 year old female presenting for evaluation of right lower extremity pain secondary to injury.  Found to have a comminuted angulated and displaced fracture of the distal right femur as well as fractures involving the distal fibula and medial malleolus.  Plan for transfer to [facility] under Dr. Shelba Flake care with plan to take her to the OR tomorrow.  She is pending lab work and Museum/gallery exhibitions officer.  Physical Exam  BP 121/69   Pulse 84   Temp 98.5 F (36.9 C) (Oral)   Resp 17   Ht 5\' 9"  (1.753 m)   Wt 90.3 kg   SpO2 99%   BMI 29.39 kg/m   Physical Exam Vitals and nursing note reviewed.  Constitutional:      General: She is not in acute distress.    Appearance: She is well-developed.  HENT:     Head: Normocephalic and atraumatic.  Eyes:     General:        Right eye: No discharge.        Left eye: No discharge.     Conjunctiva/sclera: Conjunctivae normal.  Neck:     Vascular: No JVD.     Trachea: No tracheal deviation.  Cardiovascular:     Rate and Rhythm: Normal rate.  Pulmonary:     Effort: Pulmonary effort is normal.     Comments: Speaking in full sentences without difficulty.  SPO2 saturations 89% on room air after administration of pain medications, improved to 99 to 100% on 1.5 L supplemental oxygen Abdominal:     General: There is no distension.  Musculoskeletal:     Cervical back: Neck supple.     Comments: Splint and knee immobilizer to right lower extremity  Skin:    General: Skin is warm and dry.     Findings: No erythema.  Neurological:     Mental Status: She is alert.  Psychiatric:        Behavior: Behavior normal.     ED Course/Procedures    Procedures  MDM   Labs Reviewed  BASIC METABOLIC PANEL - Abnormal; Notable for the following components:      Result Value   Glucose, Bld 281 (*)    BUN 26 (*)    All other components  within normal limits  CBC WITH DIFFERENTIAL/PLATELET - Abnormal; Notable for the following components:   WBC 11.4 (*)    Hemoglobin 11.7 (*)    RDW 11.1 (*)    Neutro Abs 9.0 (*)    All other components within normal limits  SARS CORONAVIRUS 2 (TAT 6-24 HRS)  HIV ANTIBODY (ROUTINE TESTING W REFLEX)    Lab work reviewed by me shows mild nonspecific leukocytosis, mild anemia, hyperglycemia.  Creatinine within normal limits.  After she received IV Dilaudid her O2 saturations dropped to 88 to 90% on room air though she noted no shortness of breath.  She did appear mildly drowsy on my assessment.  She was placed on 1.5 L supplemental oxygen with significant improvement in her O2 saturations.  She is overall quite well-appearing.  Suspect her hypoxia is secondary to receiving narcotic pain medicines.  She is stable for transfer to [facility] at this time.      [facility], PA-C 05/15/19 2050    07/13/19, MD 05/16/19 1900

## 2019-05-15 NOTE — ED Notes (Signed)
Pt O2 87% on RA. PA [facility] notified. Pt placed on 2L O2 and O2 now 98%.

## 2019-05-15 NOTE — ED Provider Notes (Signed)
[facility] Provider Note   CSN: [id number] Arrival date & time: 05/15/19  1330     History Chief Complaint  Patient presents with  . Knee Injury    right    Melissa Tucker is a 69 y.o. female.  The history is provided by the patient. No language interpreter was used.     69 year old female with history of bilateral knee replacement presenting for evaluation of right knee injury.  Patient report she was feeding her pets outside and accidentally slipped on wet ground and fell.  She felt a pop when her knee went behind her.  Report acute onset of sharp pain radiates down her R ankle.  Pain is minimal when she is at rest but intensified with lateral movement and she has not bear any weight on her right leg.  She denies any back pain denies any lightheadedness or dizziness and denies any numbness.  She did take hydrocodone prior to arrival with some improvement of her symptoms.  She has had her knees replacement 20 years ago by Dr. Ronnie Derby.  Past Medical History:  Diagnosis Date  . Anxiety and depression   . Chest tightness   . Diabetes mellitus 1994   No insulin  . DJD (degenerative joint disease)   . History of DVT (deep vein thrombosis) 1989   Following childbirth  . Hyperlipidemia   . Hypertension   . IBS (irritable bowel syndrome)     Patient Active Problem List   Diagnosis Date Noted  . Personal history of noncompliance with medical treatment, presenting hazards to health 11/13/2017  . Uncontrolled type 2 diabetes mellitus with hyperglycemia ([place] [place]) 11/06/2017  . Essential hypertension, benign 11/06/2017  . Class 1 obesity due to excess calories with serious comorbidity and body mass index (BMI) of 31.0 to 31.9 in adult 11/06/2017  . Rotator cuff syndrome of right shoulder 08/22/2011  . ABNORMAL CV (STRESS) TEST 12/03/2009  . DIABETES MELLITUS, TYPE II 10/05/2009  . Mixed hyperlipidemia 10/05/2009  . ANXIETY 10/05/2009  . DEPRESSION 10/05/2009  . DEEP  VENOUS THROMBOPHLEBITIS 10/05/2009  . IRRITABLE BOWEL SYNDROME 10/05/2009  . DEGENERATIVE JOINT DISEASE 10/05/2009  . EDEMA 10/05/2009  . CHEST DISCOMFORT 10/05/2009    Past Surgical History:  Procedure Laterality Date  . ABDOMINAL HYSTERECTOMY    . CESAREAN SECTION  1988  . CHOLECYSTECTOMY    . COLONOSCOPY  2005   Normal findings  . SHOULDER SURGERY     Left shoulder manipulation with subcrominal decompression, capsulitis  . TOTAL KNEE ARTHROPLASTY  2003   Left  . TOTAL KNEE ARTHROPLASTY  2007   Right     OB History   No obstetric history on file.     Family History  Problem Relation Age of Onset  . Heart disease Mother   . Hypertension Mother   . Cervical cancer Mother   . Esophageal cancer Father   . Hypertension Sister     Social History   Tobacco Use  . Smoking status: Former Smoker    Quit date: 04/10/1988    Years since quitting: 31.1  . Smokeless tobacco: Former Systems developer  . Tobacco comment: Minimal prior use  Substance Use Topics  . Alcohol use: No  . Drug use: No    Home Medications Prior to Admission medications   Medication Sig Start Date End Date Taking? Authorizing Provider  ALPRAZolam Duanne Moron) 0.5 MG tablet Take 0.5 mg by mouth 3 (three) times daily.      [provider]  aspirin 325 MG EC tablet Take 325 mg by mouth daily.      [provider]  Blood Glucose Monitoring Suppl (ACCU-CHEK GUIDE) w/Device KIT 1 Piece by Does not apply route as directed. 02/10/19   Cassandria Anger, MD  dicyclomine (BENTYL) 10 MG capsule Take 10 mg by mouth daily.      [provider]  furosemide (LASIX) 40 MG tablet Take 40 mg by mouth daily.      [provider]  GLIPIZIDE XL 10 MG 24 hr tablet TAKE 1 TABLET BY MOUTH ONCE A DAY. 03/21/19   Cassandria Anger, MD  glucose blood (ACCU-CHEK GUIDE) test strip Use as instructed 02/10/19   Cassandria Anger, MD  Hydrocodone-Acetaminophen 10-660 MG TABS Take by mouth as needed.       [provider]  INDOMETHACIN PO Take by mouth.    [provider]  lisinopril (PRINIVIL,ZESTRIL) 40 MG tablet Take 40 mg by mouth daily.      [provider]  metFORMIN (GLUCOPHAGE) 1000 MG tablet TAKE ONE TABLET BY MOUTH TWICE A DAY WITH A MEAL 03/13/19   Nida, Marella Chimes, MD  simvastatin (ZOCOR) 40 MG tablet Take 40 mg by mouth at bedtime.      [provider]  TRULICITY 7.34 KA/7.6OT SOPN INJECT 1 SYRINGE UNDER THE SKIN ONCE WEEKLY. 04/08/19   Cassandria Anger, MD  venlafaxine XR (EFFEXOR-XR) 37.5 MG 24 hr capsule TAKE 2 CAPSULES BY MOUTH DAILY. 11/13/17   Jonnie Kind, MD    Allergies    Peanut-containing drug products  Review of Systems   Review of Systems  Constitutional: Negative for fever.  Musculoskeletal: Positive for arthralgias.  Skin: Negative for wound.  Neurological: Negative for numbness.    Physical Exam Updated Vital Signs BP (!) 142/67 (BP Location: Left Arm)   Pulse (!) 111   Temp 98.5 F (36.9 C) (Oral)   Resp 16   Ht '5\' 9"'  (1.753 m)   Wt 90.3 kg   SpO2 96%   BMI 29.39 kg/m   Physical Exam Vitals and nursing note reviewed.  Constitutional:      General: She is not in acute distress.    Appearance: She is well-developed.  HENT:     Head: Atraumatic.  Eyes:     Conjunctiva/sclera: Conjunctivae normal.  Musculoskeletal:        General: Tenderness (Right knee: Tenderness to lateral joint line and anterior knee with increasing pain with knee flexion.  No deformity noted.  No significant edema. ) present.     Cervical back: Neck supple.     Comments: Right ankle: Tenderness to lateral malleoli region with edema noted.  Normal ankle range of motion, dorsalis pedis pulse palpable, no pain to fifth metatarsal region on right foot.  Skin:    Findings: No rash.  Neurological:     Mental Status: She is alert.     ED Results / Procedures / Treatments   Labs (all labs ordered are listed, but only abnormal  results are displayed) Labs Reviewed  BASIC METABOLIC PANEL  CBC WITH DIFFERENTIAL/PLATELET    EKG None  Radiology DG Ankle Complete Right  Result Date: 05/15/2019 CLINICAL DATA:  Right ankle pain secondary to a fall EXAM: RIGHT ANKLE - COMPLETE 3+ VIEW COMPARISON:  None. FINDINGS: There is a slightly displaced spiral fracture of the distal right fibular shaft centered 3.5 cm above the ankle joint. There is a transverse fracture through the medial  malleolus without displacement. No dislocation. IMPRESSION: Fractures of the distal fibula and medial malleolus as described. Electronically Signed   By: Lorriane Shire M.D.   On: 05/15/2019 14:56   DG Knee Complete 4 Views Right  Result Date: 05/15/2019 CLINICAL DATA:  Fall EXAM: RIGHT KNEE - COMPLETE 4+ VIEW COMPARISON:  None. FINDINGS: There is no dislocation. There is a comminuted, angulated, and displaced fracture through the distal shaft of the right femur. Total right knee arthroplasty appears intact. Joint effusion with probable hemarthrosis. IMPRESSION: Comminuted, angulated, and displaced fracture of the distal right femur. This appears to spare the femoral component of the arthroplasty. Electronically Signed   By: Macy Mis M.D.   On: 05/15/2019 14:57    Procedures Procedures (including critical care time)  Medications Ordered in ED Medications  morphine 4 MG/ML injection 4 mg (has no administration in time range)  ondansetron (ZOFRAN) injection 4 mg (has no administration in time range)    ED Course  I have reviewed the triage vital signs and the nursing notes.  Pertinent labs & imaging results that were available during my care of the patient were reviewed by me and considered in my medical decision making (see chart for details).  Clinical Course as of May 14 1610  Thu May 15, 2019  1607 CBC with Differential [MF]    Clinical Course User Index [MF] Renita Papa, PA-C   MDM Rules/Calculators/A&P                        BP (!) 142/67 (BP Location: Left Arm)   Pulse (!) 111   Temp 98.5 F (36.9 C) (Oral)   Resp 16   Ht '5\' 9"'  (1.753 m)   Wt 90.3 kg   SpO2 96%   BMI 29.39 kg/m   Final Clinical Impression(s) / ED Diagnoses Final diagnoses:  Closed displaced comminuted fracture of shaft of right femur, initial encounter ([place])  Displaced bimalleolar fracture of right ankle, closed, initial encounter    Rx / DC Orders ED Discharge Orders    None     2:12 PM Patient injured her right knee and ankle when she slipped and made a split prior to arrival.  Will obtain x-ray of the right knee and ankle.  Pain medication offered but patient declined.  She is neurovascular intact.  3:18 PM X-ray of the right knee demonstrate comminuted, angulated and displaced fracture of the distal right femur just above the femoral component of the hardware.  X-ray of the right ankle demonstrate fractures of the distal fibula and medial malleolus.  Both of these injuries are closed injury.  Appreciate consultation from on-call orthopedist Dr. Mardelle Matte who request patient to be transferred to [facility] to be admitted under his care. Plan to operate tomorrow, therefore pt likely need NPO at midnight.  Will obtain basic labs.  Pain medication given.  He also request knee immobilizer as well as sugar tong/posterior splint. COVID-19 test ordered. Pt sign out to oncoming provider.    SPLINT APPLICATION Date/Time: 7:68 PM Authorized by: Domenic Moras Consent: Verbal consent obtained. Risks and benefits: risks, benefits and alternatives were discussed Consent given by: patient Splint applied by: nurses Location details: R knee and R ankle Splint type: knee immobilizer for knee and sugartong and posterior splint  Supplies used: orthoglass Post-procedure: The splinted body part was neurovascularly unchanged following the procedure. Patient tolerance: Patient tolerated the procedure well with no immediate complications.  Ulyses Jarred  was evaluated in Emergency Department on 05/15/2019 for the symptoms described in the history of present illness. She was evaluated in the context of the global COVID-19 pandemic, which necessitated consideration that the patient might be at risk for infection with the SARS-CoV-2 virus that causes COVID-19. Institutional protocols and algorithms that pertain to the evaluation of patients at risk for COVID-19 are in a state of rapid change based on information released by regulatory bodies including the CDC and federal and state organizations. These policies and algorithms were followed during the patient's care in the ED.    Domenic Moras, PA-C 05/15/19 1614    Milton Ferguson, MD 05/19/19 [phone]

## 2019-05-15 NOTE — ED Triage Notes (Signed)
Pt went down hill on wet grass going to feed dog.  Right knee went back behind.  History of bilateral knee replacement.

## 2019-05-16 ENCOUNTER — Encounter (HOSPITAL_COMMUNITY)
Admission: EM | Disposition: A | Discharge status: Home Health | Payer: Self-pay | Source: Home / Self Care | Attending: Student

## 2019-05-16 ENCOUNTER — Encounter (HOSPITAL_COMMUNITY): Payer: Self-pay | Admitting: Orthopedic Surgery

## 2019-05-16 ENCOUNTER — Inpatient Hospital Stay (HOSPITAL_COMMUNITY): Payer: Medicare Other | Admitting: Anesthesiology

## 2019-05-16 ENCOUNTER — Encounter (HOSPITAL_COMMUNITY): Payer: Self-pay

## 2019-05-16 ENCOUNTER — Inpatient Hospital Stay (HOSPITAL_COMMUNITY): Payer: Medicare Other

## 2019-05-16 ENCOUNTER — Ambulatory Visit (HOSPITAL_COMMUNITY): Admission: RE | Admit: 2019-05-16 | Payer: Medicare Other | Source: Ambulatory Visit

## 2019-05-16 HISTORY — PX: ORIF FEMUR FRACTURE: SHX2119

## 2019-05-16 HISTORY — PX: ORIF ANKLE FRACTURE: SHX5408

## 2019-05-16 LAB — GLUCOSE, CAPILLARY
Glucose-Capillary: 306 mg/dL — ABNORMAL HIGH (ref 70–99)
Glucose-Capillary: 294 mg/dL — ABNORMAL HIGH (ref 70–99)
Glucose-Capillary: 305 mg/dL — ABNORMAL HIGH (ref 70–99)
Glucose-Capillary: 302 mg/dL — ABNORMAL HIGH (ref 70–99)
Glucose-Capillary: 230 mg/dL — ABNORMAL HIGH (ref 70–99)
Glucose-Capillary: 169 mg/dL — ABNORMAL HIGH (ref 70–99)
Glucose-Capillary: 175 mg/dL — ABNORMAL HIGH (ref 70–99)

## 2019-05-16 LAB — HIV ANTIBODY (ROUTINE TESTING W REFLEX): HIV Screen 4th Generation wRfx: NONREACTIVE

## 2019-05-16 LAB — VITAMIN D 25 HYDROXY (VIT D DEFICIENCY, FRACTURES): Vit D, 25-Hydroxy: 29.46 ng/mL — ABNORMAL LOW (ref 30–100)

## 2019-05-16 LAB — SARS CORONAVIRUS 2 (TAT 6-24 HRS): SARS Coronavirus 2: NEGATIVE

## 2019-05-16 LAB — SURGICAL PCR SCREEN
MRSA, PCR: NEGATIVE
Staphylococcus aureus: NEGATIVE

## 2019-05-16 SURGERY — OPEN REDUCTION INTERNAL FIXATION (ORIF) DISTAL FEMUR FRACTURE
Anesthesia: General | Site: Leg Upper | Laterality: Right

## 2019-05-16 MED ORDER — FENTANYL CITRATE (PF) 250 MCG/5ML IJ SOLN
INTRAMUSCULAR | Status: AC
  Filled 2019-05-16: qty 5

## 2019-05-16 MED ORDER — DEXMEDETOMIDINE HCL IN NACL 200 MCG/50ML IV SOLN
INTRAVENOUS | Status: DC | PRN
  Administered 2019-05-16: 4 ug via INTRAVENOUS
  Administered 2019-05-16: 12 ug via INTRAVENOUS
  Administered 2019-05-16: 8 ug via INTRAVENOUS

## 2019-05-16 MED ORDER — CEFAZOLIN SODIUM-DEXTROSE 2-3 GM-%(50ML) IV SOLR
INTRAVENOUS | Status: DC | PRN
  Administered 2019-05-16: 2 g via INTRAVENOUS

## 2019-05-16 MED ORDER — PROPOFOL 10 MG/ML IV BOLUS
INTRAVENOUS | Status: DC | PRN
  Administered 2019-05-16: 100 mg via INTRAVENOUS

## 2019-05-16 MED ORDER — FENTANYL CITRATE (PF) 100 MCG/2ML IJ SOLN
INTRAMUSCULAR | Status: AC
  Filled 2019-05-16: qty 2

## 2019-05-16 MED ORDER — MORPHINE SULFATE (PF) 2 MG/ML IV SOLN
2.0000 mg | INTRAVENOUS | Status: DC | PRN
  Administered 2019-05-16 – 2019-05-17 (×6): 2 mg via INTRAVENOUS
  Filled 2019-05-16 (×6): qty 1

## 2019-05-16 MED ORDER — ACETAMINOPHEN 325 MG PO TABS
650.0000 mg | ORAL_TABLET | Freq: Four times a day (QID) | ORAL | Status: DC
  Administered 2019-05-16 – 2019-05-18 (×8): 650 mg via ORAL
  Filled 2019-05-16 (×8): qty 2

## 2019-05-16 MED ORDER — LACTATED RINGERS IV SOLN: INTRAVENOUS | Status: DC

## 2019-05-16 MED ORDER — ACETAMINOPHEN 10 MG/ML IV SOLN
INTRAVENOUS | Status: AC
  Filled 2019-05-16: qty 100

## 2019-05-16 MED ORDER — GABAPENTIN 100 MG PO CAPS
100.0000 mg | ORAL_CAPSULE | Freq: Three times a day (TID) | ORAL | Status: DC
  Administered 2019-05-16 – 2019-05-18 (×7): 100 mg via ORAL
  Filled 2019-05-16 (×7): qty 1

## 2019-05-16 MED ORDER — FENTANYL CITRATE (PF) 100 MCG/2ML IJ SOLN
25.0000 ug | INTRAMUSCULAR | Status: DC | PRN
  Administered 2019-05-16 (×3): 50 ug via INTRAVENOUS

## 2019-05-16 MED ORDER — PROPOFOL 10 MG/ML IV BOLUS
INTRAVENOUS | Status: AC
  Filled 2019-05-16: qty 20

## 2019-05-16 MED ORDER — VANCOMYCIN HCL 1000 MG IV SOLR
INTRAVENOUS | Status: AC
  Filled 2019-05-16: qty 1000

## 2019-05-16 MED ORDER — INSULIN ASPART 100 UNIT/ML ~~LOC~~ SOLN
0.0000 [IU] | Freq: Three times a day (TID) | SUBCUTANEOUS | Status: DC
  Administered 2019-05-16: 5 [IU] via SUBCUTANEOUS
  Administered 2019-05-16: 11 [IU] via SUBCUTANEOUS
  Administered 2019-05-16: 3 [IU] via SUBCUTANEOUS
  Administered 2019-05-17 (×2): 8 [IU] via SUBCUTANEOUS
  Administered 2019-05-17: 19:00:00 11 [IU] via SUBCUTANEOUS
  Administered 2019-05-18 (×2): 5 [IU] via SUBCUTANEOUS

## 2019-05-16 MED ORDER — FENTANYL CITRATE (PF) 250 MCG/5ML IJ SOLN
INTRAMUSCULAR | Status: DC | PRN
  Administered 2019-05-16 (×3): 50 ug via INTRAVENOUS
  Administered 2019-05-16: 100 ug via INTRAVENOUS

## 2019-05-16 MED ORDER — INSULIN GLARGINE 100 UNIT/ML ~~LOC~~ SOLN
9.0000 [IU] | Freq: Every day | SUBCUTANEOUS | Status: DC
  Administered 2019-05-16 – 2019-05-17 (×2): 9 [IU] via SUBCUTANEOUS
  Filled 2019-05-16 (×3): qty 0.09

## 2019-05-16 MED ORDER — SUGAMMADEX SODIUM 200 MG/2ML IV SOLN
INTRAVENOUS | Status: DC | PRN
  Administered 2019-05-16: 200 mg via INTRAVENOUS

## 2019-05-16 MED ORDER — ONDANSETRON HCL 4 MG/2ML IJ SOLN: 4.0000 mg | Freq: Once | INTRAMUSCULAR | Status: DC | PRN

## 2019-05-16 MED ORDER — ENOXAPARIN SODIUM 40 MG/0.4ML ~~LOC~~ SOLN
40.0000 mg | SUBCUTANEOUS | Status: DC
  Administered 2019-05-17 – 2019-05-18 (×2): 40 mg via SUBCUTANEOUS
  Filled 2019-05-16 (×2): qty 0.4

## 2019-05-16 MED ORDER — ONDANSETRON HCL 4 MG/2ML IJ SOLN
INTRAMUSCULAR | Status: DC | PRN
  Administered 2019-05-16: 4 mg via INTRAVENOUS

## 2019-05-16 MED ORDER — INSULIN ASPART 100 UNIT/ML ~~LOC~~ SOLN: 0.0000 [IU] | Freq: Every day | SUBCUTANEOUS | Status: DC

## 2019-05-16 MED ORDER — INSULIN ASPART 100 UNIT/ML ~~LOC~~ SOLN
SUBCUTANEOUS | Status: AC
  Administered 2019-05-16: 10 [IU] via SUBCUTANEOUS
  Filled 2019-05-16: qty 1

## 2019-05-16 MED ORDER — LIDOCAINE 2% (20 MG/ML) 5 ML SYRINGE
INTRAMUSCULAR | Status: DC | PRN
  Administered 2019-05-16: 100 mg via INTRAVENOUS

## 2019-05-16 MED ORDER — VANCOMYCIN HCL 1000 MG IV SOLR
INTRAVENOUS | Status: DC | PRN
  Administered 2019-05-16: 2 g

## 2019-05-16 MED ORDER — INSULIN ASPART 100 UNIT/ML ~~LOC~~ SOLN: 10.0000 [IU] | Freq: Once | SUBCUTANEOUS | Status: AC

## 2019-05-16 MED ORDER — 0.9 % SODIUM CHLORIDE (POUR BTL) OPTIME
TOPICAL | Status: DC | PRN
  Administered 2019-05-16: 1000 mL

## 2019-05-16 MED ORDER — TOBRAMYCIN SULFATE 1.2 G IJ SOLR
INTRAMUSCULAR | Status: AC
  Filled 2019-05-16: qty 1.2

## 2019-05-16 MED ORDER — PHENYLEPHRINE HCL-NACL 10-0.9 MG/250ML-% IV SOLN
INTRAVENOUS | Status: DC | PRN
  Administered 2019-05-16: 30 ug/min via INTRAVENOUS

## 2019-05-16 MED ORDER — ACETAMINOPHEN 10 MG/ML IV SOLN
INTRAVENOUS | Status: DC | PRN
  Administered 2019-05-16: 1000 mg via INTRAVENOUS

## 2019-05-16 MED ORDER — ACETAMINOPHEN 10 MG/ML IV SOLN
1000.0000 mg | Freq: Once | INTRAVENOUS | Status: DC | PRN
  Administered 2019-05-16: 15:00:00 1000 mg via INTRAVENOUS

## 2019-05-16 MED ORDER — MIDAZOLAM HCL 2 MG/2ML IJ SOLN
INTRAMUSCULAR | Status: AC
  Filled 2019-05-16: qty 2

## 2019-05-16 MED ORDER — CEFAZOLIN SODIUM-DEXTROSE 2-4 GM/100ML-% IV SOLN
2.0000 g | Freq: Three times a day (TID) | INTRAVENOUS | Status: AC
  Administered 2019-05-16 – 2019-05-17 (×3): 2 g via INTRAVENOUS
  Filled 2019-05-16 (×3): qty 100

## 2019-05-16 MED ORDER — OXYCODONE HCL 5 MG PO TABS
5.0000 mg | ORAL_TABLET | ORAL | Status: DC | PRN
  Administered 2019-05-16 (×2): 10 mg via ORAL
  Administered 2019-05-16: 5 mg via ORAL
  Administered 2019-05-17 (×4): 10 mg via ORAL
  Administered 2019-05-18 (×2): 5 mg via ORAL
  Filled 2019-05-16 (×2): qty 1
  Filled 2019-05-16 (×3): qty 2
  Filled 2019-05-16: qty 1
  Filled 2019-05-16 (×2): qty 2

## 2019-05-16 MED ORDER — OXYCODONE HCL 5 MG PO TABS
ORAL_TABLET | ORAL | Status: AC
  Filled 2019-05-16: qty 2

## 2019-05-16 MED ORDER — ROCURONIUM BROMIDE 10 MG/ML (PF) SYRINGE
PREFILLED_SYRINGE | INTRAVENOUS | Status: DC | PRN
  Administered 2019-05-16: 20 mg via INTRAVENOUS
  Administered 2019-05-16: 60 mg via INTRAVENOUS

## 2019-05-16 MED ORDER — METHOCARBAMOL 500 MG PO TABS
ORAL_TABLET | ORAL | Status: AC
  Filled 2019-05-16: qty 1

## 2019-05-16 SURGICAL SUPPLY — 105 items
APL PRP STRL LF DISP 70% ISPRP (MISCELLANEOUS) ×2
BANDAGE ESMARK 6X9 LF (GAUZE/BANDAGES/DRESSINGS) ×2 IMPLANT
BIT DRILL 2.5X2.75 QC CALB (BIT) ×2 IMPLANT
BIT DRILL 4.3 (BIT) ×3
BIT DRILL 4.3MM (BIT) ×1
BIT DRILL 4.3X300MM (BIT) IMPLANT
BIT DRILL LONG 3.3 (BIT) ×1 IMPLANT
BIT DRILL LONG 3.3MM (BIT) ×1
BIT DRILL QC 3.3X195 (BIT) ×2 IMPLANT
BLADE CLIPPER SURG (BLADE) IMPLANT
BNDG CMPR 9X6 STRL LF SNTH (GAUZE/BANDAGES/DRESSINGS) ×2
BNDG CMPR MED 10X6 ELC LF (GAUZE/BANDAGES/DRESSINGS)
BNDG CMPR MED 15X6 ELC VLCR LF (GAUZE/BANDAGES/DRESSINGS) ×2
BNDG COHESIVE 4X5 TAN STRL (GAUZE/BANDAGES/DRESSINGS) ×4 IMPLANT
BNDG COHESIVE 6X5 TAN STRL LF (GAUZE/BANDAGES/DRESSINGS) ×4 IMPLANT
BNDG ELASTIC 4X5.8 VLCR STR LF (GAUZE/BANDAGES/DRESSINGS) ×2 IMPLANT
BNDG ELASTIC 6X10 VLCR STRL LF (GAUZE/BANDAGES/DRESSINGS) ×2 IMPLANT
BNDG ELASTIC 6X15 VLCR STRL LF (GAUZE/BANDAGES/DRESSINGS) ×2 IMPLANT
BNDG ELASTIC 6X5.8 VLCR STR LF (GAUZE/BANDAGES/DRESSINGS) IMPLANT
BNDG ESMARK 6X9 LF (GAUZE/BANDAGES/DRESSINGS) ×4
BRUSH SCRUB EZ PLAIN DRY (MISCELLANEOUS) ×8 IMPLANT
CANISTER SUCT 3000ML PPV (MISCELLANEOUS) ×4 IMPLANT
CAP LOCK NCB (Cap) ×16 IMPLANT
CHLORAPREP W/TINT 26 (MISCELLANEOUS) ×4 IMPLANT
CLOSURE STERI-STRIP 1/2X4 (GAUZE/BANDAGES/DRESSINGS) ×1
CLSR STERI-STRIP ANTIMIC 1/2X4 (GAUZE/BANDAGES/DRESSINGS) ×1 IMPLANT
COVER SURGICAL LIGHT HANDLE (MISCELLANEOUS) ×4 IMPLANT
COVER WAND RF STERILE (DRAPES) ×2 IMPLANT
DRAPE C-ARM 42X72 X-RAY (DRAPES) ×4 IMPLANT
DRAPE C-ARMOR (DRAPES) ×4 IMPLANT
DRAPE HALF SHEET 40X57 (DRAPES) ×8 IMPLANT
DRAPE ORTHO SPLIT 77X108 STRL (DRAPES) ×8
DRAPE SURG 17X23 STRL (DRAPES) ×4 IMPLANT
DRAPE SURG ORHT 6 SPLT 77X108 (DRAPES) ×4 IMPLANT
DRAPE U-SHAPE 47X51 STRL (DRAPES) ×4 IMPLANT
DRSG ADAPTIC 3X8 NADH LF (GAUZE/BANDAGES/DRESSINGS) IMPLANT
DRSG MEPILEX BORDER 4X12 (GAUZE/BANDAGES/DRESSINGS) IMPLANT
DRSG MEPILEX BORDER 4X4 (GAUZE/BANDAGES/DRESSINGS) IMPLANT
DRSG MEPILEX BORDER 4X8 (GAUZE/BANDAGES/DRESSINGS) IMPLANT
DRSG MEPITEL 4X7.2 (GAUZE/BANDAGES/DRESSINGS) ×2 IMPLANT
DRSG PAD ABDOMINAL 8X10 ST (GAUZE/BANDAGES/DRESSINGS) ×6 IMPLANT
ELECT REM PT RETURN 9FT ADLT (ELECTROSURGICAL) ×4
ELECTRODE REM PT RTRN 9FT ADLT (ELECTROSURGICAL) ×2 IMPLANT
GAUZE SPONGE 4X4 12PLY STRL (GAUZE/BANDAGES/DRESSINGS) ×4 IMPLANT
GAUZE SPONGE 4X4 12PLY STRL LF (GAUZE/BANDAGES/DRESSINGS) ×2 IMPLANT
GLOVE BIO SURGEON STRL SZ 6.5 (GLOVE) ×9 IMPLANT
GLOVE BIO SURGEON STRL SZ7.5 (GLOVE) ×16 IMPLANT
GLOVE BIO SURGEONS STRL SZ 6.5 (GLOVE) ×3
GLOVE BIOGEL PI IND STRL 6.5 (GLOVE) ×2 IMPLANT
GLOVE BIOGEL PI IND STRL 7.5 (GLOVE) ×2 IMPLANT
GLOVE BIOGEL PI INDICATOR 6.5 (GLOVE) ×2
GLOVE BIOGEL PI INDICATOR 7.5 (GLOVE) ×2
GOWN STRL REUS W/ TWL LRG LVL3 (GOWN DISPOSABLE) ×6 IMPLANT
GOWN STRL REUS W/TWL LRG LVL3 (GOWN DISPOSABLE) ×12
K-WIRE 2.0 (WIRE) ×4
K-WIRE FXSTD 280X2XNS SS (WIRE) ×2
KIT BASIN OR (CUSTOM PROCEDURE TRAY) ×4 IMPLANT
KIT TURNOVER KIT B (KITS) ×4 IMPLANT
KWIRE FXSTD 280X2XNS SS (WIRE) IMPLANT
MANIFOLD NEPTUNE II (INSTRUMENTS) ×2 IMPLANT
NDL HYPO 21X1.5 SAFETY (NEEDLE) IMPLANT
NDL HYPO 25GX1X1/2 BEV (NEEDLE) ×2 IMPLANT
NEEDLE HYPO 21X1.5 SAFETY (NEEDLE) IMPLANT
NEEDLE HYPO 25GX1X1/2 BEV (NEEDLE) IMPLANT
NS IRRIG 1000ML POUR BTL (IV SOLUTION) ×4 IMPLANT
PACK TOTAL JOINT (CUSTOM PROCEDURE TRAY) ×4 IMPLANT
PAD ARMBOARD 7.5X6 YLW CONV (MISCELLANEOUS) ×8 IMPLANT
PAD CAST 4YDX4 CTTN HI CHSV (CAST SUPPLIES) ×2 IMPLANT
PADDING CAST COTTON 4X4 STRL (CAST SUPPLIES) ×4
PADDING CAST COTTON 6X4 STRL (CAST SUPPLIES) ×4 IMPLANT
PLATE ACE 100DEG 8HOLE (Plate) ×2 IMPLANT
PLATE FEM DIST NCB PP 278MM (Plate) ×2 IMPLANT
SCREW 5.0 80MM (Screw) ×6 IMPLANT
SCREW CORTICAL 3.5MM  12MM (Screw) ×2 IMPLANT
SCREW CORTICAL 3.5MM  60MM (Screw) ×2 IMPLANT
SCREW CORTICAL 3.5MM 12MM (Screw) IMPLANT
SCREW CORTICAL 3.5MM 14MM (Screw) ×4 IMPLANT
SCREW CORTICAL 3.5MM 22MM (Screw) ×2 IMPLANT
SCREW CORTICAL 3.5MM 24MM (Screw) ×2 IMPLANT
SCREW CORTICAL 3.5MM 60MM (Screw) IMPLANT
SCREW CORTICAL 3.5MM 70MM (Screw) ×4 IMPLANT
SCREW NCB 4.0MX34M (Screw) ×2 IMPLANT
SCREW NCB 5.0X34MM (Screw) ×2 IMPLANT
SCREW NCB 5.0X36MM (Screw) ×2 IMPLANT
SCREW NCB 5.0X38 (Screw) ×2 IMPLANT
SCREW NCB 5.0X85MM (Screw) ×4 IMPLANT
SPONGE LAP 18X18 RF (DISPOSABLE) IMPLANT
STAPLER VISISTAT 35W (STAPLE) ×4 IMPLANT
SUCTION FRAZIER HANDLE 10FR (MISCELLANEOUS) ×2
SUCTION TUBE FRAZIER 10FR DISP (MISCELLANEOUS) ×2 IMPLANT
SUT ETHILON 3 0 PS 1 (SUTURE) ×4 IMPLANT
SUT MNCRL AB 3-0 PS2 18 (SUTURE) ×2 IMPLANT
SUT PROLENE 0 CT (SUTURE) IMPLANT
SUT VIC AB 0 CT1 27 (SUTURE) ×4
SUT VIC AB 0 CT1 27XBRD ANBCTR (SUTURE) ×2 IMPLANT
SUT VIC AB 1 CT1 27 (SUTURE)
SUT VIC AB 1 CT1 27XBRD ANBCTR (SUTURE) IMPLANT
SUT VIC AB 2-0 CT1 27 (SUTURE) ×4
SUT VIC AB 2-0 CT1 TAPERPNT 27 (SUTURE) ×4 IMPLANT
SYR CONTROL 10ML LL (SYRINGE) ×2 IMPLANT
TOWEL GREEN STERILE (TOWEL DISPOSABLE) ×8 IMPLANT
TOWEL GREEN STERILE FF (TOWEL DISPOSABLE) ×4 IMPLANT
TRAY FOLEY MTR SLVR 16FR STAT (SET/KITS/TRAYS/PACK) IMPLANT
UNDERPAD 30X30 (UNDERPADS AND DIAPERS) ×4 IMPLANT
WATER STERILE IRR 1000ML POUR (IV SOLUTION) ×6 IMPLANT

## 2019-05-16 NOTE — Transfer of Care (Signed)
Immediate Anesthesia Transfer of Care Note  Patient: MICIAH SHEALY  Procedure(s) Performed: OPEN REDUCTION INTERNAL FIXATION (ORIF) DISTAL FEMUR FRACTURE (Right Leg Upper) OPEN REDUCTION INTERNAL FIXATION (ORIF) ANKLE FRACTURE (Right Ankle)  Patient Location: PACU  Anesthesia Type:General  Level of Consciousness: awake, alert  and oriented  Airway & Oxygen Therapy: Patient Spontanous Breathing and Patient connected to nasal cannula oxygen  Post-op Assessment: Report given to RN and Post -op Vital signs reviewed and stable  Post vital signs: Reviewed and stable  Last Vitals:  Vitals Value Taken Time  BP 136/68 05/16/19 1511  Temp    Pulse 101 05/16/19 1514  Resp 16 05/16/19 1514  SpO2 94 % 05/16/19 1514  Vitals shown include unvalidated device data.  Last Pain:  Vitals:   05/16/19 1144  TempSrc: Oral  PainSc:       Patients Stated Pain Goal: 2 (05/16/19 1131)  Complications: No apparent anesthesia complications

## 2019-05-16 NOTE — Op Note (Signed)
Orthopaedic Surgery Operative Note (CSN: [id number] ) Date of Surgery: 05/16/2019  Admit Date: 05/15/2019   Diagnoses: Pre-Op Diagnoses: Right periprosthetic distal femur fracture Right trimalleolar ankle fracture   Post-Op Diagnosis: Same  Procedures: 1. CPT 27511-Open reduction internal fixation of right supracondylar distal femur fracture 2. CPT 27822-Open reduction internal fixation of right trimalleolar ankle fracture  Surgeons : Primary: Roby Lofts, MD  Assistant: Ulyses Southward, PA-C  Location: OR 3   Anesthesia:General  Antibiotics: Ancef 2g preop, 1 gram of vancomycin powder in femur and ankle incisions.   Tourniquet time:None    Estimated Blood Loss:200 mL  Complications:None  Specimens:None   Implants: Implant Name Type Inv. Item Serial No. Manufacturer Lot No. LRB No. Used Action  PLATE FEM DIST NCB PP - [id number] Plate PLATE FEM DIST NCB PP  [employer]IMMER [employer](ORTH,TRAU,BIO,SG)  Right 1 Implanted  SCREW 5.0 - [id number] Screw SCREW 5.0  [employer](ORTH,TRAU,BIO,SG)  Right 3 Implanted  SCREW NCB 5.0X85MM - [id number] Screw SCREW NCB 5.0X85MM  [employer](ORTH,TRAU,BIO,SG)  Right 2 Implanted  SCREW NCB 5.0X36MM - [id number] Screw SCREW NCB 5.0X36MM  [employer](ORTH,TRAU,BIO,SG)  Right 1 Implanted  SCREW NCB 5.0X34MM - [id number] Screw SCREW NCB 5.0X34MM  [employer](ORTH,TRAU,BIO,SG)  Right 1 Implanted  SCREW NCB 5.0X38 - [id number] Screw SCREW NCB 5.0X38  [employer](ORTH,TRAU,BIO,SG)  Right 1 Implanted  SCREW NCB 4.0MX34M - [id number] Screw SCREW NCB 4.0MX34M  [employer](ORTH,TRAU,BIO,SG)  Right 1 Implanted  CAP LOCK NCB - [id number] Cap CAP LOCK NCB  [employer](ORTH,TRAU,BIO,SG)  Right 8 Implanted  PLATE ACE [id number] 8HOLE - [id number] Plate PLATE ACE [id number] 8HOLE  [employer](ORTH,TRAU,BIO,SG)  Right 1 Implanted  SCREW CORTICAL 3.5MM - [id number] Screw SCREW CORTICAL 3.5MM  [employer](ORTH,TRAU,BIO,SG)  Right 1 Implanted  SCREW  CORTICAL 3.5MM - [id number] Screw SCREW CORTICAL 3.5MM  [employer](ORTH,TRAU,BIO,SG)  Right 2 Implanted  SCREW CORTICAL 3.5MM - [id number] Screw SCREW CORTICAL 3.5MM  [employer](ORTH,TRAU,BIO,SG)  Right 1 Implanted  SCREW CORTICAL 3.5MM  - [id number] Screw SCREW CORTICAL 3.5MM   Z[employer]MER [employer](ORTH,TRAU,BIO,SG)  Right 1 Implanted  SCREW CORTICAL 3.5MM  - [id number] Screw SCREW CORTICAL 3.5MM   ZIMMER [employer]ON(ORTH,TRAU,BIO,SG)  Right 1 Implanted  SCREW CORTICAL 3.5MM - [id number] Screw SCREW CORTICAL 3.5MM  [employer](ORTH,TRAU,BIO,SG)  Right 2 Implanted     Indications for Surgery: 69 year old female who tripped over her leg at home and sustained a right periprosthetic distal femur fracture and a right trimalleolar ankle fracture.  Due to the unstable nature of her injuries I recommended proceeding with open reduction internal fixation.  Risks and benefits were discussed with the patient.  Risks included but not limited to bleeding, infection, malunion, nonunion, hardware failure, hardware irritation, nerve and blood vessel injury, knee stiffness, ankle stiffness, posttraumatic arthritis, DVT, even the possibility of anesthetic complications.  The patient agreed to proceed with surgery and consent was obtained.  Operative Findings: 1.  Open reduction internal fixation of right periprosthetic distal femur fracture using [employer] 12 hole NCB distal femoral locking plate. 2.  Open reduction internal fixation of right trimalleolar ankle fracture using [employer] 8 hole one third tubular plate and independent 3.5 millimeter screws for the medial malleolus.  Quadra cortical syndesmotic screw was placed for reinforcement fixation due to the posterior malleolus fracture  Procedure: The patient was identified in the preoperative holding area. Consent was confirmed with the patient and their family and all questions  were answered. The operative extremity  was marked after confirmation with the patient. she was then brought back to the operating room by our anesthesia colleagues.  She was placed under general anesthetic and carefully transferred over to a radiolucent flat top table.  A bump was placed under her operative hip.  The right lower extremity was then prepped and draped in usual sterile fashion.  A timeout was performed to verify the patient, the procedure, and the extremity.  Preoperative antibiotics were dosed.  Fluoroscopic imaging of the distal femur was obtained showing the unstable nature of her injury.  A lateral approach to the distal femur was carried through skin and subcutaneous tissue.  The IT band was released in line with my incision.  I mobilized the vastus lateralis up to be able to access the lateral cortex of the femur.  A Cobb elevator was used to clear a path for the plate.  A triangle was used to assist with reduction in the AP and lateral plane.  A 12 hole [employer] NCB distal femoral locking plate was attached to the jig and slid submuscularly along the lateral cortex of the femur.  A K wire was used in the distal segment to hold the position and a 3.3 mm drill bit was used to percutaneously to hold the proximal portion of the plate in position.  The plate was confirmed with AP and lateral fluoroscopic imaging.  A 3.3 mm drill bit was used to drill and placed 5.0 millimeter screws in the distal segment.  A 5.0 millimeter screw was placed into the femoral shaft to correct the coronal alignment of the fracture.  Another 5.0 millimeter screw was placed just proximal to this.  The 3.3 mm drill bit in the proximal hole was exchanged for a 4.0 millimeter screw.  Locking caps were placed on the 5.0 mm femoral shaft screws.  I returned to the distal segment and placed three 5.0 millimeter screws.  A total of 5 5 screws were placed in the distal segment.  Locking caps were placed in all of these.  Fluoroscopic imaging was obtained.   The incision was copiously irrigated.  A gram of vancomycin powder was placed along the plate.  The IT band was closed with 0 Vicryl suture.  The skin was closed with 2-0 Vicryl and 3-0 Monocryl.  I then turned my attention to the right ankle.  She had a Weber C fibula fracture with a small posterior mall avulsion fracture with a medial malleolus fracture as well.  I made a direct lateral approach to the fibula carried down through skin and subcutaneous tissue.  I took care with my dissection to make sure I did not damage the superficial peroneal nerve.  The soft tissue periosteum was still intact around the fracture and from fluoroscopic imaging I obtained it did not appear to be significantly shortened or displaced and as a result I did not take down the periosteum.  I did release the peroneal retinaculum to be able to access the posterior aspect of the fibula.  I contoured a 8 hole one third tubular [employer] plate and placed it posterior on the distal fibula fracture and lateral in the proximal segment.  I confirmed reduction was anatomic and then placed a nonlocking screw in the distal segment.  Nonlocking screw was placed in the proximal segment.  I then placed 2 more nonlocking screws into the fibular shaft.  Another nonlocking screw was placed in the distal segment.  I  then confirmed anatomic reduction and with the ankle dorsiflexed I provided some medial force with my hand and then drilled a Quadra cortical path for placement of a syndesmotic screw to reinforce the fixation with her history of diabetes.  The medial malleolus fracture was anatomically aligned.  I felt that percutaneous fixation was appropriate and a formal approach was not necessary.  I made percutaneous incisions and then drilled and placed a 3.5 mm nonlocking screws.  Excellent purchase was obtained.  Final fluoroscopic imaging was obtained.  External rotation stress view showed no medial clear space widening.  The incision was  copiously irrigated.  A gram of vancomycin powder was placed in the lateral incision.  Layered closure of 2-0 Vicryl and 3-0 nylon was used.  Sterile dressing was applied to both the distal femur incisions in the ankle incision.  The patient was awoken from anesthesia and taken to the PACU in stable condition.  Post Op Plan/Instructions: Patient will be nonweightbearing to the right lower extremity.  We will place her in a walking boot to the right lower extremity.  She is allowed unrestricted range of motion of her knee and ankle.  We will provide Ancef for surgical prophylaxis.  I will recommend Lovenox for DVT prophylaxis for at least 6 weeks with her history of previous DVT.  We will have her mobilize with physical and Occupational Therapy.  I was present and performed the entire surgery.  Patrecia Pace, PA-C did assist me throughout the case. An assistant was necessary given the difficulty in approach, maintenance of reduction and ability to instrument the fracture.   Katha Hamming, MD [facility]

## 2019-05-16 NOTE — Anesthesia Postprocedure Evaluation (Signed)
Anesthesia Post Note  Patient: KADEE PHILYAW  Procedure(s) Performed: OPEN REDUCTION INTERNAL FIXATION (ORIF) DISTAL FEMUR FRACTURE (Right Leg Upper) OPEN REDUCTION INTERNAL FIXATION (ORIF) ANKLE FRACTURE (Right Ankle)     Patient location during evaluation: PACU Anesthesia Type: General Level of consciousness: awake and alert Pain management: pain level controlled Vital Signs Assessment: post-procedure vital signs reviewed and stable Respiratory status: spontaneous breathing, nonlabored ventilation, respiratory function stable and patient connected to nasal cannula oxygen Cardiovascular status: blood pressure returned to baseline and stable Postop Assessment: no apparent nausea or vomiting Anesthetic complications: no    Last Vitals:  Vitals:   05/16/19 1600 05/16/19 1612  BP:  126/72  Pulse: 94 91  Resp: 16 17  Temp:  36.9 C  SpO2: 95% 91%    Last Pain:  Vitals:   05/16/19 1612  TempSrc: Oral  PainSc:                  Shina Wass DAVID

## 2019-05-16 NOTE — Anesthesia Preprocedure Evaluation (Addendum)
Anesthesia Evaluation  Patient identified by MRN, date of birth, ID band Patient awake    Reviewed: Allergy & Precautions, NPO status , Patient's Chart, lab work & pertinent test results  Airway Mallampati: II  TM Distance: >3 FB Neck ROM: Full    Dental no notable dental hx. (+) Teeth Intact, Dental Advisory Given   Pulmonary neg pulmonary ROS, former smoker,    Pulmonary exam normal breath sounds clear to auscultation       Cardiovascular hypertension, Pt. on medications + DVT  Normal cardiovascular exam Rhythm:Regular Rate:Normal     Neuro/Psych negative neurological ROS  negative psych ROS   GI/Hepatic negative GI ROS, Neg liver ROS,   Endo/Other  diabetes, Type 2  Renal/GU negative Renal ROS     Musculoskeletal   Abdominal   Peds  Hematology negative hematology ROS (+)   Anesthesia Other Findings   Reproductive/Obstetrics                            Anesthesia Physical Anesthesia Plan  ASA: III  Anesthesia Plan: General   Post-op Pain Management:    Induction: Intravenous  PONV Risk Score and Plan: 3 and Treatment may vary due to age or medical condition, Ondansetron and Dexamethasone  Airway Management Planned: Oral ETT  Additional Equipment:   Intra-op Plan:   Post-operative Plan: Extubation in OR  Informed Consent: I have reviewed the patients History and Physical, chart, labs and discussed the procedure including the risks, benefits and alternatives for the proposed anesthesia with the patient or authorized representative who has indicated his/her understanding and acceptance.     Dental advisory given  Plan Discussed with: CRNA  Anesthesia Plan Comments:         Anesthesia Quick Evaluation

## 2019-05-16 NOTE — Anesthesia Procedure Notes (Signed)
Procedure Name: Intubation Date/Time: 05/16/2019 1:11 PM Performed by: Griffin Dakin, CRNA Pre-anesthesia Checklist: Patient identified, Emergency Drugs available, Suction available and Patient being monitored Patient Re-evaluated:Patient Re-evaluated prior to induction Oxygen Delivery Method: Circle system utilized Preoxygenation: Pre-oxygenation with 100% oxygen Induction Type: IV induction Ventilation: Mask ventilation without difficulty Laryngoscope Size: Mac and 3 Tube type: Oral Tube size: 7.5 mm Number of attempts: 1 Airway Equipment and Method: Stylet Placement Confirmation: ETT inserted through vocal cords under direct vision,  positive ETCO2 and breath sounds checked- equal and bilateral Secured at: 23 cm Tube secured with: Tape Dental Injury: Teeth and Oropharynx as per pre-operative assessment

## 2019-05-16 NOTE — Consult Note (Addendum)
Orthopaedic Trauma Service (OTS) Consult   Patient ID: Melissa Tucker MRN: [id number] DOB/AGE: [birth year] 69 y.o.  Reason for Consult: Right distal femur fracture and right ankle fracture Referring Physician: Dr. Marchia Bond, MD [facility]  HPI: Melissa Tucker is an 69 y.o. female who is being seen in consultation at the request of Dr. Mardelle Matte for evaluation of right distal femur fracture right ankle fracture.  The patient has a history of diabetes for which she does not take insulin.  She also has hypertension hyperlipidemia and a previous DVT.  She has a history of bilateral knee replacements performed by Dr. Lorre Nick.  She has not had any problems with her knee replacements.  She was walking in her backyard she slipped and her leg fell underneath her.  She had immediate pop and inability bear weight.  X-ray showed a distal femur fracture and a right ankle fracture.  Due to the complexity of her injuries and the OR availability Dr. Mardelle Matte recommended treatment by an orthopedic traumatologist.  Patient was seen and evaluated on 5 N.  Pain is currently under control.  No major complaints.  Denies any injuries anywhere else.  Patient states that she lives with her daughter who is 72 years old.  She did not ambulate with a cane or a walker prior to this accident.  She denies any tobacco use.  She denies any numbness or tingling.  Pain worsens with attempts at motion of the ankle or knee.  Past Medical History:  Diagnosis Date  . Anxiety and depression   . Chest tightness   . Diabetes mellitus 1994   No insulin  . DJD (degenerative joint disease)   . History of DVT (deep vein thrombosis) 1989   Following childbirth  . Hyperlipidemia   . Hypertension   . IBS (irritable bowel syndrome)     Past Surgical History:  Procedure Laterality Date  . ABDOMINAL HYSTERECTOMY    . CESAREAN SECTION  1988  . CHOLECYSTECTOMY    . COLONOSCOPY  2005   Normal findings  . SHOULDER SURGERY     Left shoulder manipulation with subcrominal decompression, capsulitis  . TOTAL KNEE ARTHROPLASTY  2003   Left  . TOTAL KNEE ARTHROPLASTY  2007   Right    Family History  Problem Relation Age of Onset  . Heart disease Mother   . Hypertension Mother   . Cervical cancer Mother   . Esophageal cancer Father   . Hypertension Sister     Social History:  reports that she quit smoking about 31 years ago. She has quit using smokeless tobacco. She reports that she does not drink alcohol or use drugs.  Allergies:  Allergies  Allergen Reactions  . Peanut-Containing Drug Products Swelling    Medications:  No current facility-administered medications on file prior to encounter.   Current Outpatient Medications on File Prior to Encounter  Medication Sig Dispense Refill  . ALPRAZolam (XANAX) 0.5 MG tablet Take 0.5 mg by mouth 2 (two) times daily.     Marland Kitchen aspirin 325 MG EC tablet Take 325 mg by mouth daily.      Marland Kitchen BIOTIN PO Take 1 tablet by mouth daily.    . colesevelam (WELCHOL) 625 MG tablet Take 1,875 mg by mouth 2 (two) times daily as needed (irritable bowel syndrome).     Marland Kitchen dicyclomine (BENTYL) 10 MG capsule Take 10 mg by mouth daily.      Marland Kitchen escitalopram (LEXAPRO) 10 MG tablet Take 10 mg  by mouth daily.    Marland Kitchen GLIPIZIDE XL 10 MG 24 hr tablet TAKE 1 TABLET BY MOUTH ONCE A DAY. (Patient taking differently: Take 10 mg by mouth daily with breakfast. ) 30 tablet 5  . HYDROcodone-acetaminophen (NORCO) 10-325 MG tablet Take 1 tablet by mouth 4 (four) times daily as needed.    . INVOKANA 300 MG TABS tablet Take 300 mg by mouth daily as needed (for sugar level).     Marland Kitchen lisinopril (PRINIVIL,ZESTRIL) 40 MG tablet Take 10 mg by mouth daily.     . metFORMIN (GLUCOPHAGE) 1000 MG tablet TAKE ONE TABLET BY MOUTH TWICE A DAY WITH A MEAL (Patient taking differently: Take 1,000 mg by mouth 2 (two) times daily with a meal. TAKE ONE TABLET BY MOUTH TWICE A DAY WITH A MEAL) 60 tablet 2  . metoCLOPramide (REGLAN) 5  MG tablet Take 5 mg by mouth 4 (four) times daily as needed for nausea or vomiting.     . Multiple Vitamin (MULTIVITAMIN) tablet Take 1 tablet by mouth daily.    . simvastatin (ZOCOR) 40 MG tablet Take 40 mg by mouth at bedtime.      . TRULICITY 9.41 DE/0.8XK SOPN INJECT 1 SYRINGE UNDER THE SKIN ONCE WEEKLY. (Patient taking differently: Inject 0.75 mg into the skin once a week. ) 2 mL 2  . vitamin B-12 (CYANOCOBALAMIN) 1000 MCG tablet Take 1,000 mcg by mouth daily.    Marland Kitchen VITAMIN D PO Take 1 tablet by mouth daily.    . Blood Glucose Monitoring Suppl (ACCU-CHEK GUIDE) w/Device KIT 1 Piece by Does not apply route as directed. (Patient not taking: Reported on 05/15/2019) 1 kit 0  . furosemide (LASIX) 40 MG tablet Take 40 mg by mouth daily.      Marland Kitchen glucose blood (ACCU-CHEK GUIDE) test strip Use as instructed (Patient not taking: Reported on 05/15/2019) 150 each 2  . Hydrocodone-Acetaminophen 10-660 MG TABS Take by mouth as needed.      . INDOMETHACIN PO Take by mouth.    . venlafaxine XR (EFFEXOR-XR) 37.5 MG 24 hr capsule TAKE 2 CAPSULES BY MOUTH DAILY. (Patient not taking: Reported on 05/15/2019) 90 capsule 3    ROS: Constitutional: No fever or chills Vision: No changes in vision ENT: No difficulty swallowing CV: No chest pain Pulm: No SOB or wheezing GI: No nausea or vomiting GU: No urgency or inability to hold urine Skin: No poor wound healing Neurologic: No numbness or tingling Psychiatric: No depression or anxiety Heme: No bruising Allergic: No reaction to medications or food   Exam: Blood pressure (!) 125/58, pulse 89, temperature 98.4 F (36.9 C), temperature source Oral, resp. rate 16, height _0  (1.753 m), weight 90.3 kg, SpO2 98 %. General: No acute distress Orientation: Awake alert and oriented x3 Mood and Affect: Cooperative and pleasant Gait: Unable to assess due to her fracture Coordination and balance: Within normal limits  Right lower extremity: Knee immobilizer is in place.   Short leg splint is in place.  Compartments are soft compressible.  Does not tolerate any motion of the leg.  Diffuse tenderness.  Full motor function distally with intact EHL and FHL.  Sensation is grossly intact light touch to the dorsum and plantar aspect of her foot.  She has a warm well-perfused foot with brisk cap refill.  Left lower extremity: Skin without lesions. No tenderness to palpation. Full painless ROM, full strength in each muscle groups without evidence of instability.   Medical Decision Making: Data: Imaging:  X-rays of the right knee and right ankle are reviewed which shows a periprosthetic distal femur fracture with mild angulation and comminution.  3 views of the right ankle show a Weber C distal fibula fracture with a avulsion of the medial malleolus.  Lateral views does not show a posterior malleolus fracture.  No obvious subluxation or dislocation of the mortise.  Labs:  Results for orders placed or performed during the hospital encounter of 05/15/19 (from the past 24 hour(s))  Basic metabolic panel     Status: Abnormal   Collection Time: 05/15/19  3:05 PM  Result Value Ref Range   Sodium 135 135 - 145 mmol/L   Potassium 4.4 3.5 - 5.1 mmol/L   Chloride 99 98 - 111 mmol/L   CO2 26 22 - 32 mmol/L   Glucose, Bld 281 (H) 70 - 99 mg/dL   BUN 26 (H) 8 - 23 mg/dL   Creatinine, Ser 0.49 0.44 - 1.00 mg/dL   Calcium 9.7 8.9 - 10.3 mg/dL   GFR calc non Af Amer >60 >60 mL/min   GFR calc Af Amer >60 >60 mL/min   Anion gap 10 5 - 15  CBC with Differential     Status: Abnormal   Collection Time: 05/15/19  3:05 PM  Result Value Ref Range   WBC 11.4 (H) 4.0 - 10.5 K/uL   RBC 3.89 3.87 - 5.11 MIL/uL   Hemoglobin 11.7 (L) 12.0 - 15.0 g/dL   HCT 37.0 36.0 - 46.0 %   MCV 95.1 80.0 - 100.0 fL   MCH 30.1 26.0 - 34.0 pg   MCHC 31.6 30.0 - 36.0 g/dL   RDW 11.1 (L) 11.5 - 15.5 %   Platelets 346 150 - 400 K/uL   nRBC 0.0 0.0 - 0.2 %   Neutrophils Relative % 80 %   Neutro Abs 9.0  (H) 1.7 - 7.7 K/uL   Lymphocytes Relative 16 %   Lymphs Abs 1.8 0.7 - 4.0 K/uL   Monocytes Relative 4 %   Monocytes Absolute 0.5 0.1 - 1.0 K/uL   Eosinophils Relative 0 %   Eosinophils Absolute 0.0 0.0 - 0.5 K/uL   Basophils Relative 0 %   Basophils Absolute 0.0 0.0 - 0.1 K/uL   Immature Granulocytes 0 %   Abs Immature Granulocytes 0.05 0.00 - 0.07 K/uL  SARS CORONAVIRUS 2 (TAT 6-24 HRS) Nasopharyngeal Nasopharyngeal Swab     Status: None   Collection Time: 05/15/19  4:07 PM   Specimen: Nasopharyngeal Swab  Result Value Ref Range   SARS Coronavirus 2 NEGATIVE NEGATIVE  Glucose, capillary     Status: Abnormal   Collection Time: 05/16/19  4:01 AM  Result Value Ref Range   Glucose-Capillary 306 (H) 70 - 99 mg/dL  Glucose, capillary     Status: Abnormal   Collection Time: 05/16/19  6:36 AM  Result Value Ref Range   Glucose-Capillary 294 (H) 70 - 99 mg/dL    Imaging or Labs ordered: None  Medical history and chart was reviewed and case discussed with medical provider.  Assessment/Plan: 69 year old female with a history of diabetes, DVT, and hypertension status post ground-level fall with right periprosthetic distal femur fracture right bimalleolar ankle fracture.  Due to the unstable nature of her injuries I recommend proceeding with open reduction internal fixation.  We will plan to perform this today.  Risks and benefits were discussed with the patient.  Risks include but not limited to bleeding, infection, malunion, nonunion, hardware failure, hardware irritation, nerve  and blood vessel injury, DVT, even the possibility anesthetic complications.  The patient agrees to proceed with surgery and consent will be obtained.  The patient will likely be nonweightbearing postoperatively.  We will mobilize with physical therapy depending on how she does schedule either discharge home or may need a rehab stay.   New problem w/ workup planned: High complexity diagnosis (Level 5) Surgery w/  risks or Emergency surgery: High complexity Risk (Level 5)  All others are Level 4 with comprehensive musculoskeletal exam.  Shona Needles, MD [facility] [phone] (office) [email or web address]

## 2019-05-16 NOTE — Progress Notes (Signed)
Orthopedic Tech Progress Note Patient Details:  Melissa Tucker [birth year] [id number] Called in order to [facility] for a [facility]. Patient ID: Ricardo Jericho, female   DOB: 08/30/1950, 69 y.o.   MRN: [id number]   Donald Pore 05/16/2019, 4:20 PM

## 2019-05-16 NOTE — H&P (Signed)
ORTHOPAEDIC H&P  Chief Complaint: right knee pain, right ankle pain  HPI: Melissa Tucker is a 69 y.o. female who complains of right knee and ankle pain. She has a medical history postive for DM, HTN, IBS, hyperlipidemia, and previous DVT. History of bilateral knee replacements performed approximately 20 years ago by Dr. Sherlean Foot. Patient states she has had no knee problems since her knee replacements up until this point.  Patient was walking in her backyard to feed her pets today when she slipped on wet ground. Her right knee tucked beneath her, she felt a pop in her knee, and significant sharp pain in her knee and right ankle. She was worried that her knee replacement had dislodged and she was brought to the [facility] ED. She took a hydrocodone prior to arrival in the ED which did improve her pain.   Radiographs performed at [facility] showed a closed periprosthetic right femur fracture and right distal fibula and distal tibia fractures. Dr. Dion Saucier was consulted and patient was transferred to [facility] for surgical management. Right ankle was splinted in a posterior splint and right knee was secured in a knee immobilizer. Since coming to [facility] patient states pain has significantly improved with pain medication. She denies shortness of breath, chest pain, nausea, vomiting, abdominal pain, numbness and tingling.  Past Medical History:  Diagnosis Date  . Anxiety and depression   . Chest tightness   . Diabetes mellitus 1994   No insulin  . DJD (degenerative joint disease)   . History of DVT (deep vein thrombosis) 1989   Following childbirth  . Hyperlipidemia   . Hypertension   . IBS (irritable bowel syndrome)    Past Surgical History:  Procedure Laterality Date  . ABDOMINAL HYSTERECTOMY    . CESAREAN SECTION  1988  . CHOLECYSTECTOMY    . COLONOSCOPY  2005   Normal findings  . SHOULDER SURGERY     Left shoulder manipulation with subcrominal decompression, capsulitis  .  TOTAL KNEE ARTHROPLASTY  2003   Left  . TOTAL KNEE ARTHROPLASTY  2007   Right   Social History   Socioeconomic History  . Marital status: Divorced    Spouse name: Not on file  . Number of children: Not on file  . Years of education: Not on file  . Highest education level: Not on file  Occupational History  . Occupation: disabled    Associate Professor: UNEMPLOYED  Tobacco Use  . Smoking status: Former Smoker    Quit date: 04/10/1988    Years since quitting: 31.1  . Smokeless tobacco: Former Neurosurgeon  . Tobacco comment: Minimal prior use  Substance and Sexual Activity  . Alcohol use: No  . Drug use: No  . Sexual activity: Yes  Other Topics Concern  . Not on file  Social History Narrative   Divorced   Resides with daughter, also has 2 sons   No regular exercise   Social Determinants of Health   Financial Resource Strain:   . Difficulty of Paying Living Expenses: Not on file  Food Insecurity:   . Worried About Programme researcher, broadcasting/film/video in the Last Year: Not on file  . Ran Out of Food in the Last Year: Not on file  Transportation Needs:   . Lack of Transportation (Medical): Not on file  . Lack of Transportation (Non-Medical): Not on file  Physical Activity:   . Days of Exercise per Week: Not on file  . Minutes of Exercise per Session:  Not on file  Stress:   . Feeling of Stress : Not on file  Social Connections:   . Frequency of Communication with Friends and Family: Not on file  . Frequency of Social Gatherings with Friends and Family: Not on file  . Attends Religious Services: Not on file  . Active Member of Clubs or Organizations: Not on file  . Attends Archivist Meetings: Not on file  . Marital Status: Not on file   Family History  Problem Relation Age of Onset  . Heart disease Mother   . Hypertension Mother   . Cervical cancer Mother   . Esophageal cancer Father   . Hypertension Sister    Allergies  Allergen Reactions  . Peanut-Containing Drug Products Swelling      Positive ROS: All other systems have been reviewed and were otherwise negative with the exception of those mentioned in the HPI and as above.  Physical Exam: General: Alert, no acute distress. Sitting comfortably in bed. Cardiovascular: Normal rate and rhythm.  Respiratory: No cyanosis, no use of accessory musculature. Patient currently using nasal canula.  GI: No organomegaly, abdomen is soft and non-tender Skin: No lesions noted around round knee.  Neurologic: Sensation intact distally Psychiatric: Patient is competent for consent with normal mood and affect Lymphatic: No axillary or cervical lymphadenopathy  MUSCULOSKELETAL:  RLE: TTP at distal femur. Mild edema noted at distal femur. Compartments soft. Right ankle in posterior splint. Distal sensation and capillary refill are intact. Able to move all toes of right foot without pain.   Radiographs: Right knee: Comminuted, angulated, and displaced fracture of the distal right femur.  Right Ankle: There is a slightly displaced spiral fracture of the distal right fibular shaft centered 3.5 cm above the ankle joint. There is a transverse fracture through the medial malleolus without displacement. No dislocation.   Assessment/Plan: Active Problems:   Femur fracture (HCC)   Closed fracture of right femur (HCC)  Right periprosthetic femur fracture Right spiral fracture of distal fibula and transverse distal tibia fracture - ORIF of right ankle and right femur with Dr. Doreatha Martin tomorrow - will plan to keep posterior ankle splint and knee immobilizer until taken to the OR tomorrow - continue PRN pain medications  Johnny Bridge, MD    05/16/2019 1:53 AM

## 2019-05-16 NOTE — Progress Notes (Signed)
Inpatient Diabetes Program Recommendations  AACE/ADA: New Consensus Statement on Inpatient Glycemic Control (2015)  Target Ranges:  Prepandial:   less than 140 mg/dL      Peak postprandial:   less than 180 mg/dL (1-2 hours)      Critically ill patients:  140 - 180 mg/dL  Results for MEYGAN, KYSER (MRN [id number]) as of 05/16/2019 11:26  Ref. Range 05/16/2019 04:01 05/16/2019 06:36  Glucose-Capillary Latest Ref Range: 70 - 99 mg/dL 622 (H) 297 (H)   Results for TANIJA, GERMANI (MRN [id number]) as of 05/16/2019 11:26  Ref. Range 05/15/2018 11:29 08/14/2018 11:42 02/10/2019 10:39  Hemoglobin A1C Latest Ref Range: 4.0 - 5.6 % 10.0 (H) 10.3 (H) 9.6 (A)   Review of Glycemic Control  Diabetes history: DM2 Outpatient Diabetes medications: Glipizide XL 10 mg QAM, Metformin 1000 mg BID, Trulicity 0.75 mg Qweek Current orders for Inpatient glycemic control: None  Inpatient Diabetes Program Recommendations:   Insulin - Basal: Please consider ordering Lantus 9 units Q24H (based on 90 kg x 0.1 units).  Correction (SSI): Please consider ordering CBGs AC&HS with Novolog 0-15 units TID with meals and Novolog 0-5 units QHS.  NOTE: Noted patient has DM2 and takes outpatient DM medications. No current orders for inpatient glycemic control. In reviewing chart, noted patient sees Dr. Fransico Him (last seen 02/10/19) for DM management and noted that at that visit patient was started on Trulicity and has been reluctant to use insulin.  Thanks, Orlando Penner, RN, MSN, CDE Diabetes Coordinator Inpatient Diabetes Program [phone] (Team Pager from 8am to 5pm)

## 2019-05-16 NOTE — Progress Notes (Signed)
Pt glucose this am was 294, current glucose 305, pt has no orders for sliding scale. Dr Jena Gauss notified and informed him that DM coordinator had put suggestions in her progress notes as well.

## 2019-05-17 LAB — GLUCOSE, CAPILLARY
Glucose-Capillary: 273 mg/dL — ABNORMAL HIGH (ref 70–99)
Glucose-Capillary: 282 mg/dL — ABNORMAL HIGH (ref 70–99)
Glucose-Capillary: 305 mg/dL — ABNORMAL HIGH (ref 70–99)
Glucose-Capillary: 191 mg/dL — ABNORMAL HIGH (ref 70–99)

## 2019-05-17 LAB — CBC
HCT: 28.2 % — ABNORMAL LOW (ref 36.0–46.0)
Hemoglobin: 9 g/dL — ABNORMAL LOW (ref 12.0–15.0)
MCH: 29.6 pg (ref 26.0–34.0)
MCHC: 31.9 g/dL (ref 30.0–36.0)
MCV: 92.8 fL (ref 80.0–100.0)
Platelets: 277 10*3/uL (ref 150–400)
RBC: 3.04 MIL/uL — ABNORMAL LOW (ref 3.87–5.11)
RDW: 11.3 % — ABNORMAL LOW (ref 11.5–15.5)
WBC: 9.3 10*3/uL (ref 4.0–10.5)
nRBC: 0 % (ref 0.0–0.2)

## 2019-05-17 MED ORDER — KETOROLAC TROMETHAMINE 15 MG/ML IJ SOLN
15.0000 mg | Freq: Four times a day (QID) | INTRAMUSCULAR | Status: AC
  Administered 2019-05-17 – 2019-05-18 (×5): 15 mg via INTRAVENOUS
  Filled 2019-05-17 (×5): qty 1

## 2019-05-17 MED ORDER — GLIPIZIDE ER 10 MG PO TB24
10.0000 mg | ORAL_TABLET | Freq: Every day | ORAL | Status: DC
  Administered 2019-05-17 – 2019-05-18 (×2): 10 mg via ORAL
  Filled 2019-05-17 (×4): qty 1

## 2019-05-17 MED ORDER — METFORMIN HCL 500 MG PO TABS
1000.0000 mg | ORAL_TABLET | Freq: Two times a day (BID) | ORAL | Status: DC
  Administered 2019-05-17 – 2019-05-18 (×3): 1000 mg via ORAL
  Filled 2019-05-17 (×3): qty 2

## 2019-05-17 NOTE — Evaluation (Addendum)
Physical Therapy Evaluation Patient Details Name: Melissa Tucker MRN: [id number] DOB: Jan 15, 1951 Today's Date: 05/17/2019   History of Present Illness  Pt is a 69 y/o female s/p R periprosthetic distal femur fx s/p ORIF and R ankle fx s/p ORIF. PMH including but not limited to DM and HTN.    Clinical Impression  Upon arrival, pt exiting bathroom with daughter and nurse tech present, just finishing up a bath. Prior to admission, pt reported that she was independent with all functional mobility and ADLs. Pt lives with her daughter in a two level home (basement) with a couple of steps to enter (no railing). Pt's daughter will be able to provide 24/7 supervision/assistance upon d/c. At the time of evaluation, pt overall moving very well. She was able to perform short distance ambulation with RW and min guard and initiated stair training this session as well. Will need further stair training at next session with daughter prior to d/c home. Pt would continue to benefit from skilled physical therapy services at this time while admitted and after d/c to address the below listed limitations in order to improve overall safety and independence with functional mobility.      Follow Up Recommendations Home health PT;Supervision/Assistance - 24 hour    Equipment Recommendations  Rolling walker with 5" wheels;3in1 (PT)    Recommendations for Other Services       Precautions / Restrictions Precautions Precautions: Fall Required Braces or Orthoses: Other Brace Other Brace: CAM boot Restrictions Weight Bearing Restrictions: Yes RLE Weight Bearing: Non weight bearing      Mobility  Bed Mobility               General bed mobility comments: pt OOB exiting bathroom with daughter and nurse tech present upon arrival  Transfers Overall transfer level: Needs assistance Equipment used: Rolling walker (2 wheeled) Transfers: Sit to/from Stand Sit to Stand: Min guard         General transfer  comment: min guard for safety with transitions, cueing for hand placement; performed x4  Ambulation/Gait Ambulation/Gait assistance: Min guard Gait Distance (Feet): 30 Feet Assistive device: Rolling walker (2 wheeled) Gait Pattern/deviations: (hop-to on L LE) Gait velocity: decreased   General Gait Details: cueing to maintain NWB R LE throughout, pt with mild instability but no overt LOB or need for physical assistance, close min guard for safety  Stairs Stairs: Yes Stairs assistance: Min guard Stair Management: No rails;Step to pattern;Backwards;With walker Number of Stairs: 1 General stair comments: initiation of stair training with daughter present throughout; PT demonstrated and instructed pt in technique with RW backwards as pt does not have railing with her steps at home  Wheelchair Mobility    Modified Rankin (Stroke Patients Only)       Balance Overall balance assessment: Needs assistance Sitting-balance support: Feet supported Sitting balance-Leahy Scale: Good     Standing balance support: Bilateral upper extremity supported;Single extremity supported;During functional activity Standing balance-Leahy Scale: Poor                               Pertinent Vitals/Pain Pain Assessment: 0-10 Pain Score: 5  Pain Location: R leg Pain Descriptors / Indicators: Aching;Sore Pain Intervention(s): Monitored during session;Repositioned    Home Living Family/patient expects to be discharged to:: Private residence Living Arrangements: Children Available Help at Discharge: Family;Available 24 hours/day Type of Home: House Home Access: Stairs to enter   [employer] of Steps: 2  Home Layout: One level;Laundry or work area in basement;Other (Comment)(dtr goes in basement) Home Equipment: None      Prior Function Level of Independence: Independent               Hand Dominance        Extremity/Trunk Assessment   Upper Extremity  Assessment Upper Extremity Assessment: Defer to OT evaluation;Overall WFL for tasks assessed    Lower Extremity Assessment Lower Extremity Assessment: RLE deficits/detail RLE Deficits / Details: pt with decreased strength and ROM limitations secondary to post-op pain and weakness    Cervical / Trunk Assessment Cervical / Trunk Assessment: Normal  Communication   Communication: No difficulties  Cognition Arousal/Alertness: Awake/alert Behavior During Therapy: WFL for tasks assessed/performed Overall Cognitive Status: Within Functional Limits for tasks assessed                                        General Comments      Exercises General Exercises - Lower Extremity Hip ABduction/ADduction: AAROM;Right;10 reps;Seated Straight Leg Raises: AAROM;Right;15 reps;Seated   Assessment/Plan    PT Assessment Patient needs continued PT services  PT Problem List Decreased strength;Decreased range of motion;Decreased activity tolerance;Decreased balance;Decreased mobility;Decreased coordination;Decreased knowledge of use of DME;Decreased safety awareness;Decreased knowledge of precautions;Pain       PT Treatment Interventions DME instruction;Gait training;Stair training;Therapeutic activities;Functional mobility training;Therapeutic exercise;Balance training;Neuromuscular re-education;Patient/family education    PT Goals (Current goals can be found in the Care Plan section)  Acute Rehab PT Goals Patient Stated Goal: decrease pain PT Goal Formulation: With patient/family Time For Goal Achievement: 05/31/19 Potential to Achieve Goals: Good    Frequency Min 5X/week   Barriers to discharge        Co-evaluation PT/OT/SLP Co-Evaluation/Treatment: Yes Reason for Co-Treatment: To address functional/ADL transfers;For patient/therapist safety PT goals addressed during session: Mobility/safety with mobility;Balance;Proper use of DME;Strengthening/ROM         AM-PAC PT  "6 Clicks" Mobility  Outcome Measure Help needed turning from your back to your side while in a flat bed without using bedrails?: A Little Help needed moving from lying on your back to sitting on the side of a flat bed without using bedrails?: A Little Help needed moving to and from a bed to a chair (including a wheelchair)?: A Little Help needed standing up from a chair using your arms (e.g., wheelchair or bedside chair)?: A Little Help needed to walk in hospital room?: A Little Help needed climbing 3-5 steps with a railing? : A Little 6 Click Score: 18    End of Session Equipment Utilized During Treatment: Gait belt Activity Tolerance: Patient tolerated treatment well Patient left: in chair;with call bell/phone within reach;with family/visitor present Nurse Communication: Mobility status PT Visit Diagnosis: Other abnormalities of gait and mobility (R26.89)    Time: 8546-2703 PT Time Calculation (min) (ACUTE ONLY): 31 min   Charges:   PT Evaluation $PT Eval Moderate Complexity: 1 Mod          Ginette Pitman, PT, DPT  Acute Rehabilitation Services Pager [phone] Office [phone]    Alessandra Bevels Cane Dubray 05/17/2019, 3:20 PM

## 2019-05-17 NOTE — Evaluation (Addendum)
Occupational Therapy Evaluation Patient Details Name: Melissa Tucker MRN: [id number] DOB: [birth year] Today's Date: 05/17/2019    History of Present Illness Pt is a 69 y/o female s/p R periprosthetic distal femur fx s/p ORIF and R ankle fx s/p ORIF. PMH including but not limited to DM and HTN.   Clinical Impression   PTA pt independent, living with dtr. Dtr present for evaluation and supportive. Pt able to complete functional transfers at min guard level with RW. VC' needed to ensure WB status followed at beginning, but after cues and education pt was following appropriately. Pt completed toilet transfer with min guard assist. Had pt in therapy gym to practice tub transfer with tub bench at min guard assist for RW. Educated both pt and dtr on use of 3:1 and shower seat as well as advantages for tub bench. Pt presents as min A for LB dressing, prefers dtr to assist whom is available 24/7. Issued leg lifter for pt to increase independence with bed mobility during periods she may be by herself.  No further OT needs identified at this time, OT will sign off after one time eval. Thank you for this consult.     Follow Up Recommendations  No OT follow up;Supervision - Intermittent    Equipment Recommendations  3 in 1 bedside commode;Tub/shower bench;Other (comment)(educated pt on dtr on usage of both 3:1 and tub bench)    Recommendations for Other Services       Precautions / Restrictions Precautions Precautions: Fall Required Braces or Orthoses: Other Brace Other Brace: CAM boot Restrictions Weight Bearing Restrictions: Yes RLE Weight Bearing: Non weight bearing      Mobility Bed Mobility               General bed mobility comments: pt OOB exiting bathroom with daughter and nurse tech present upon arrival  Transfers Overall transfer level: Needs assistance Equipment used: Rolling walker (2 wheeled) Transfers: Sit to/from Stand Sit to Stand: Min guard         General transfer  comment: min guard for safety with transitions, cueing for hand placement; performed x4    Balance Overall balance assessment: Needs assistance Sitting-balance support: Feet supported Sitting balance-Leahy Scale: Good     Standing balance support: Bilateral upper extremity supported;Single extremity supported;During functional activity Standing balance-Leahy Scale: Poor Standing balance comment: reliant on external support and additionally challenged considering NWB status                           ADL either performed or assessed with clinical judgement   ADL Overall ADL's : Needs assistance/impaired Eating/Feeding: Independent   Grooming: Independent   Upper Body Bathing: Independent   Lower Body Bathing: Minimal assistance;Sit to/from stand   Upper Body Dressing : Independent   Lower Body Dressing: Minimal assistance;Sit to/from stand Lower Body Dressing Details (indicate cue type and reason): dtr assisting, pt prefers that dtr assists rather than AE Toilet Transfer: Min guard;Stand-pivot;Regular Toilet;RW   Toileting- Clothing Manipulation and Hygiene: Set up;Sit to/from stand;Sitting/lateral lean   Tub/ Shower Transfer: Min guard;Tub bench;Ambulation;Rolling walker Tub/Shower Transfer Details (indicate cue type and reason): simulated with tub and tub bench in therapy gym for pt practice and application. Overall min guard for safety with sequencing cues for appropriate form with transfer. Education given to both pt and dtr on adjusting tub bench appropriately at home Functional mobility during ADLs: Min guard;Rolling walker General ADL Comments: pt mostly limited by RLE  post op pain     Vision         Perception     Praxis      Pertinent Vitals/Pain Pain Assessment: 0-10 Pain Score: 5  Pain Location: R leg Pain Descriptors / Indicators: Aching;Sore Pain Intervention(s): Monitored during session;Repositioned     Hand Dominance     Extremity/Trunk  Assessment Upper Extremity Assessment Upper Extremity Assessment: Overall WFL for tasks assessed   Lower Extremity Assessment Lower Extremity Assessment: Defer to PT evaluation RLE Deficits / Details: pt with decreased strength and ROM limitations secondary to post-op pain and weakness   Cervical / Trunk Assessment Cervical / Trunk Assessment: Normal   Communication Communication Communication: No difficulties   Cognition Arousal/Alertness: Awake/alert Behavior During Therapy: WFL for tasks assessed/performed Overall Cognitive Status: Within Functional Limits for tasks assessed                                     General Comments       Exercises Exercises: General Lower Extremity General Exercises - Lower Extremity Hip ABduction/ADduction: AAROM;Right;10 reps;Seated Straight Leg Raises: AAROM;Right;15 reps;Seated   Shoulder Instructions      Home Living Family/patient expects to be discharged to:: Private residence Living Arrangements: Children Available Help at Discharge: Family;Available 24 hours/day Type of Home: House Home Access: Stairs to enter [employer] of Steps: 2   Home Layout: One level;Laundry or work area in basement;Other (Comment)(dtr goes in basement)     Bathroom Shower/Tub: Teacher, early years/pre: Standard Bathroom Accessibility: Yes How Accessible: Accessible via wheelchair;Accessible via walker Home Equipment: None          Prior Functioning/Environment Level of Independence: Independent                 OT Problem List: Decreased knowledge of use of DME or AE;Decreased knowledge of precautions;Pain;Impaired balance (sitting and/or standing)      OT Treatment/Interventions:      OT Goals(Current goals can be found in the care plan section) Acute Rehab OT Goals Patient Stated Goal: decrease pain OT Goal Formulation: With patient Time For Goal Achievement: 05/31/19 Potential to Achieve Goals:  Good  OT Frequency:     Barriers to D/C:            Co-evaluation PT/OT/SLP Co-Evaluation/Treatment: Yes Reason for Co-Treatment: For patient/therapist safety;To address functional/ADL transfers PT goals addressed during session: Mobility/safety with mobility;Balance;Strengthening/ROM OT goals addressed during session: ADL's and self-care;Proper use of Adaptive equipment and DME;Strengthening/ROM      AM-PAC OT "6 Clicks" Daily Activity     Outcome Measure Help from another person eating meals?: None Help from another person taking care of personal grooming?: None Help from another person toileting, which includes using toliet, bedpan, or urinal?: A Little Help from another person bathing (including washing, rinsing, drying)?: A Little Help from another person to put on and taking off regular upper body clothing?: None Help from another person to put on and taking off regular lower body clothing?: A Little 6 Click Score: 21   End of Session Equipment Utilized During Treatment: Gait belt;Rolling walker;Other (comment)(R CAM) Nurse Communication: Mobility status;Precautions;Weight bearing status;Patient requests pain meds  Activity Tolerance: Patient tolerated treatment well Patient left: in chair;with call bell/phone within reach;with family/visitor present  OT Visit Diagnosis: Other abnormalities of gait and mobility (R26.89);Pain Pain - Right/Left: Right Pain - part of body: Leg;Ankle and joints of  foot                Time: 1740-8144 OT Time Calculation (min): 29 min Charges:  OT General Charges $OT Visit: 1 Visit OT Evaluation $OT Eval Low Complexity: 1 Low  Dalphine Handing, MSOT, OTR/L Acute Rehabilitation Services [facility] Office Number: [phone]  Dalphine Handing 05/17/2019, 3:49 PM

## 2019-05-17 NOTE — Progress Notes (Signed)
Orthopaedic Trauma Progress Note  S: Pain doing okay this AM. No major complaints. On O2 wants to come off of it.   O:  Vitals:   05/17/19 0351 05/17/19 0723  BP: (!) 148/59 (!) 152/61  Pulse: 92 95  Resp: 17 17  Temp: 98.3 F (36.8 C) 98.9 F (37.2 C)  SpO2: 98% 99%    Gen NAD, AAOx3 RLE: dressings clean, dry and intact Compartments soft and compressible Neurovascularly intact  Imaging: Stable postop imaging  Labs:  Results for orders placed or performed during the hospital encounter of 05/15/19 (from the past 24 hour(s))  Surgical pcr screen     Status: None   Collection Time: 05/16/19 10:59 AM   Specimen: Nasal Mucosa; Nasal Swab  Result Value Ref Range   MRSA, PCR NEGATIVE NEGATIVE   Staphylococcus aureus NEGATIVE NEGATIVE  Glucose, capillary     Status: Abnormal   Collection Time: 05/16/19 11:21 AM  Result Value Ref Range   Glucose-Capillary 305 (H) 70 - 99 mg/dL  Glucose, capillary     Status: Abnormal   Collection Time: 05/16/19 12:26 PM  Result Value Ref Range   Glucose-Capillary 302 (H) 70 - 99 mg/dL  Glucose, capillary     Status: Abnormal   Collection Time: 05/16/19  3:12 PM  Result Value Ref Range   Glucose-Capillary 230 (H) 70 - 99 mg/dL   Comment 1 Notify RN   VITAMIN D 25 Hydroxy (Vit-D Deficiency, Fractures)     Status: Abnormal   Collection Time: 05/16/19  4:37 PM  Result Value Ref Range   Vit D, 25-Hydroxy 29.46 (L) 30 - 100 ng/mL  Glucose, capillary     Status: Abnormal   Collection Time: 05/16/19  4:37 PM  Result Value Ref Range   Glucose-Capillary 169 (H) 70 - 99 mg/dL  Glucose, capillary     Status: Abnormal   Collection Time: 05/16/19  8:30 PM  Result Value Ref Range   Glucose-Capillary 175 (H) 70 - 99 mg/dL  Glucose, capillary     Status: Abnormal   Collection Time: 05/17/19  6:32 AM  Result Value Ref Range   Glucose-Capillary 273 (H) 70 - 99 mg/dL    Assessment: 69 yo female s/p fall  Injuries: 1. R periprosthetic distal femur  fx s/p ORIF 2. R ankle fx s/p ORIF  Weightbearing: NWB, Unrestricted ROM of knee and ankle  Insicional and dressing care: Dressing to be changed POD 2/3  Orthopedic device(s):Boot on when up with therapy  CV/Blood loss:Hgb pending this AM.  Pain management: 1. Oxycodone 5-10 mg q 4 hours PRN 2. Morphine 2 mg q2hrs PRN 3. Robaxin 500 mg q 6 hours PRN  Will add toradol this AM  VTE prophylaxis: Lovenox to start this AM. History of DVT so will need for 4-6 weeks postop  ID: [id number] to be completed today  Foley/Lines: None  Medical co-morbidities: Diabetes: SSI and Lantus qHS while in hospital. Restarted glipizide and metformin this AM  Impediments to Fracture Healing: Vit D slightly low. Elevated sugars  Dispo: PT/OT eval, likely home with HHPT Monday  Follow - up plan: 2 weeks   Roby Lofts, MD [facility] [phone] (phone)

## 2019-05-18 ENCOUNTER — Ambulatory Visit: Payer: Medicare Other

## 2019-05-18 DIAGNOSIS — S82851A Displaced trimalleolar fracture of right lower leg, initial encounter for closed fracture: Secondary | ICD-10-CM

## 2019-05-18 LAB — GLUCOSE, CAPILLARY
Glucose-Capillary: 210 mg/dL — ABNORMAL HIGH (ref 70–99)
Glucose-Capillary: 238 mg/dL — ABNORMAL HIGH (ref 70–99)

## 2019-05-18 MED ORDER — BISACODYL 5 MG PO TBEC: 5.0000 mg | DELAYED_RELEASE_TABLET | Freq: Every day | ORAL | 0 refills | Status: DC | PRN

## 2019-05-18 MED ORDER — OXYCODONE-ACETAMINOPHEN 5-325 MG PO TABS: 1.0000 | ORAL_TABLET | ORAL | 0 refills | Status: DC | PRN

## 2019-05-18 MED ORDER — METHOCARBAMOL 500 MG PO TABS: 500.0000 mg | ORAL_TABLET | Freq: Four times a day (QID) | ORAL | 0 refills | Status: DC | PRN

## 2019-05-18 MED ORDER — ENOXAPARIN SODIUM 40 MG/0.4ML ~~LOC~~ SOLN: 40.0000 mg | SUBCUTANEOUS | 0 refills | Status: DC

## 2019-05-18 MED ORDER — GABAPENTIN 100 MG PO CAPS: 100.0000 mg | ORAL_CAPSULE | Freq: Three times a day (TID) | ORAL | 0 refills | Status: DC

## 2019-05-18 NOTE — Progress Notes (Signed)
Subjective: 2 Days Post-Op Procedure(s) (LRB): OPEN REDUCTION INTERNAL FIXATION (ORIF) DISTAL FEMUR FRACTURE (Right) OPEN REDUCTION INTERNAL FIXATION (ORIF) ANKLE FRACTURE (Right) Patient reports pain as 2 on 0-10 scale.    Objective: Vital signs in last 24 hours: Temp:  [97.2 F (36.2 C)-98.8 F (37.1 C)] 98.2 F (36.8 C) (02/07 0402) Pulse Rate:  [97-107] 107 (02/07 0402) Resp:  [16-18] 17 (02/07 0402) BP: (134-160)/(60-69) 134/60 (02/07 0402) SpO2:  [92 %-97 %] 96 % (02/07 0402)  Intake/Output from previous day: 02/06 0701 - 02/07 0700 In: 720 [P.O.:720] Out: 900 [Urine:900] Intake/Output this shift: Total I/O In: 240 [P.O.:240] Out: 500 [Urine:500]  Recent Labs    05/15/19 1505 05/17/19 0714  HGB 11.7* 9.0*   Recent Labs    05/15/19 1505 05/17/19 0714  WBC 11.4* 9.3  RBC 3.89 3.04*  HCT 37.0 28.2*  PLT 346 277   Recent Labs    05/15/19 1505  NA 135  K 4.4  CL 99  CO2 26  BUN 26*  CREATININE 0.49  GLUCOSE 281*  CALCIUM 9.7   No results for input(s): LABPT, INR in the last 72 hours.  ABD soft Neurovascular intact Sensation intact distally Intact pulses distally Dorsiflexion/Plantar flexion intact   Assessment/Plan: 2 Days Post-Op Procedure(s) (LRB): OPEN REDUCTION INTERNAL FIXATION (ORIF) DISTAL FEMUR FRACTURE (Right) OPEN REDUCTION INTERNAL FIXATION (ORIF) ANKLE FRACTURE (Right) Discharge home with home health   Patient doing well   Wants to go home    DME is already ordered  Will do discharge orders   Mariana Goytia J Lakira Ogando 05/18/2019, 1:37 PM

## 2019-05-18 NOTE — TOC Transition Note (Signed)
Transition of Care [facility]) - CM/SW Discharge Note   Patient Details  Name: Melissa Tucker MRN: [id number] Date of Birth: Aug 29, 1950  [facility] of [facility] CM/SW Contact:  Deveron Furlong, RN 05/18/2019, 3:09 PM   Clinical Narrative:    Pt to d/c home with [facility] PT.  DME RW, 3n1, and tub bench ordered for delivery to room prior to d/c.  [facility] referral accepted by Cassie with [facility].    Final next level of care: Home w Home Health Services Barriers to Discharge: No Barriers Identified   Patient Goals and CMS Choice Patient states their goals for this hospitalization and ongoing recovery are:: to get stronger CMS Medicare.gov Compare Post Acute Care list provided to:: Patient Choice offered to / list presented to : Patient   Discharge Plan and Services                DME Arranged: 3-N-1, Tub bench, Walker rolling DME Agency: [employer] Date DME Agency Contacted: 05/18/19 Time DME Agency Contacted: 1408 Representative spoke with at DME Agency: Ledell Noss HH Arranged: PT HH Agency: [facility] Date [facility] Agency Contacted: 05/18/19 Time HH Agency Contacted: 1508 Representative spoke with at [facility] Agency: Malcolm Metro

## 2019-05-18 NOTE — Plan of Care (Signed)
  Problem: Activity: Goal: Ability to avoid complications of mobility impairment will improve Outcome: Progressing Goal: Ability to tolerate increased activity will improve Outcome: Progressing   Problem: Coping: Goal: Level of anxiety will decrease Outcome: Progressing   Problem: Activity: Goal: Risk for activity intolerance will decrease Outcome: Progressing

## 2019-05-18 NOTE — Progress Notes (Signed)
Physical Therapy Treatment Patient Details Name: Melissa Tucker MRN: [id number] DOB: 08/14/1950 Today's Date: 05/18/2019    History of Present Illness Pt is a 69 y/o female s/p R periprosthetic distal femur fx s/p ORIF and R ankle fx s/p ORIF. PMH including but not limited to DM and HTN.    PT Comments    Continuing work on functional mobility and activity tolerance;  Notable improvements in gait and stair negotiation; we discussed car transfer as well; OK for dc home from PT standpoint   Follow Up Recommendations  Home health PT;Supervision/Assistance - 24 hour     Equipment Recommendations  Rolling walker with 5" wheels;3in1 (PT)    Recommendations for Other Services       Precautions / Restrictions Precautions Precautions: Fall Required Braces or Orthoses: Other Brace Other Brace: CAM boot Restrictions RLE Weight Bearing: Non weight bearing    Mobility  Bed Mobility Overal bed mobility: Needs Assistance Bed Mobility: Supine to Sit     Supine to sit: Min guard     General bed mobility comments: minguard for safety; slow moving  Transfers Overall transfer level: Needs assistance Equipment used: Rolling walker (2 wheeled) Transfers: Sit to/from Stand Sit to Stand: Min guard         General transfer comment: min guard for safety with transitions, cueing for hand placement; performed x4  Ambulation/Gait Ambulation/Gait assistance: Min guard Gait Distance (Feet): 40 Feet Assistive device: Rolling walker (2 wheeled) Gait Pattern/deviations: (Hop-to on LLE)     General Gait Details: Good maintenance of NWB   Stairs Stairs: Yes Stairs assistance: Min guard Stair Management: No rails;Step to pattern;Backwards;With walker Number of Stairs: 1(x2 trials) General stair comments: continuation of stair training, adding in the second step; we took time to problem-solve through ways to get up, and landed on the idea of hopping up first step as practiced, then sitting  down to a chair placed at teh edge of teh second step -- teh chair would be without armrests and set sideways to allow for her to turn in the chair and then stand from the cahir into her home main level   Wheelchair Mobility    Modified Rankin (Stroke Patients Only)       Balance                                            Cognition Arousal/Alertness: Awake/alert Behavior During Therapy: WFL for tasks assessed/performed Overall Cognitive Status: Within Functional Limits for tasks assessed                                        Exercises      General Comments        Pertinent Vitals/Pain Pain Assessment: 0-10 Pain Score: 5  Pain Location: R leg Pain Descriptors / Indicators: Aching;Sore Pain Intervention(s): Monitored during session    Home Living                      Prior Function            PT Goals (current goals can now be found in the care plan section) Acute Rehab PT Goals Patient Stated Goal: decrease pain PT Goal Formulation: With patient/family Time For Goal Achievement: 05/31/19 Potential to Achieve Goals:  Good Progress towards PT goals: Progressing toward goals    Frequency    Min 5X/week      PT Plan Current plan remains appropriate    Co-evaluation              AM-PAC PT "6 Clicks" Mobility   Outcome Measure  Help needed turning from your back to your side while in a flat bed without using bedrails?: A Little Help needed moving from lying on your back to sitting on the side of a flat bed without using bedrails?: A Little Help needed moving to and from a bed to a chair (including a wheelchair)?: A Little Help needed standing up from a chair using your arms (e.g., wheelchair or bedside chair)?: A Little Help needed to walk in hospital room?: A Little Help needed climbing 3-5 steps with a railing? : A Little 6 Click Score: 18    End of Session Equipment Utilized During Treatment: Gait  belt Activity Tolerance: Patient tolerated treatment well Patient left: in chair;with call bell/phone within reach;with family/visitor present Nurse Communication: Mobility status PT Visit Diagnosis: Other abnormalities of gait and mobility (R26.89)     Time: 7867-5449 PT Time Calculation (min) (ACUTE ONLY): 34 min  Charges:  $Gait Training: 23-37 mins                     Melissa Tucker, PT  Acute Rehabilitation Services Pager [phone] Office [phone]    Melissa Tucker 05/18/2019, 5:18 PM

## 2019-05-18 NOTE — Progress Notes (Signed)
Pt iv removed and AVS reviewed, all questions answered. Pt belongings gathered and pt family notified, awaiting ride.

## 2019-05-18 NOTE — Plan of Care (Signed)
  Problem: Activity: Goal: Ability to avoid complications of mobility impairment will improve Outcome: Progressing   

## 2019-05-18 NOTE — Discharge Instructions (Signed)
[facility] Discharge Instructions   General Discharge Instructions  WEIGHT BEARING STATUS: Non-weightbearing right lower extremity  RANGE OF MOTION/ACTIVITY: Should wear boot on right lower extremity at all times except when showering. Okay for unrestricted range of motion of the knee  Wound Care:  Incisions can be left open to air if there is no drainage. If incision continues to have drainage, follow wound care instructions below. Okay to shower if no drainage from incisions. Would recommend an Ace wrap or sock be worn under the boot on the right leg  DVT/PE prophylaxis: Lovenox x 6 weeks  Diet: as you were eating previously.  Can use over the counter stool softeners and bowel preparations, such as Miralax, to help with bowel movements.  Narcotics can be constipating.  Be sure to drink plenty of fluids  PAIN MEDICATION USE AND EXPECTATIONS  You have likely been given narcotic medications to help control your pain.  After a traumatic event that results in an fracture (broken bone) with or without surgery, it is ok to use narcotic pain medications to help control one's pain.  We understand that everyone responds to pain differently and each individual patient will be evaluated on a regular basis for the continued need for narcotic medications. Ideally, narcotic medication use should last no more than 6-8 weeks (coinciding with fracture healing).   As a patient it is your responsibility as well to monitor narcotic medication use and report the amount and frequency you use these medications when you come to your office visit.   We would also advise that if you are using narcotic medications, you should take a dose prior to therapy to maximize you participation.  IF YOU ARE ON NARCOTIC MEDICATIONS IT IS NOT PERMISSIBLE TO OPERATE A MOTOR VEHICLE (MOTORCYCLE/CAR/TRUCK/MOPED) OR HEAVY MACHINERY DO NOT MIX NARCOTICS WITH OTHER CNS (CENTRAL NERVOUS SYSTEM) DEPRESSANTS SUCH AS  ALCOHOL   STOP SMOKING OR USING NICOTINE PRODUCTS!!!!  As discussed nicotine severely impairs your body's ability to heal surgical and traumatic wounds but also impairs bone healing.  Wounds and bone heal by forming microscopic blood vessels (angiogenesis) and nicotine is a vasoconstrictor (essentially, shrinks blood vessels).  Therefore, if vasoconstriction occurs to these microscopic blood vessels they essentially disappear and are unable to deliver necessary nutrients to the healing tissue.  This is one modifiable factor that you can do to dramatically increase your chances of healing your injury.    (This means no smoking, no nicotine gum, patches, etc)  DO NOT USE NONSTEROIDAL ANTI-INFLAMMATORY DRUGS (NSAID'S)  Using products such as Advil (ibuprofen), Aleve (naproxen), Motrin (ibuprofen) for additional pain control during fracture healing can delay and/or prevent the healing response.  If you would like to take over the counter (OTC) medication, Tylenol (acetaminophen) is ok.  However, some narcotic medications that are given for pain control contain acetaminophen as well. Therefore, you should not exceed more than 4000 mg of tylenol in a day if you do not have liver disease.  Also note that there are may OTC medicines, such as cold medicines and allergy medicines that my contain tylenol as well.  If you have any questions about medications and/or interactions please ask your doctor/PA or your pharmacist.      ICE AND ELEVATE INJURED/OPERATIVE EXTREMITY  Using ice and elevating the injured extremity above your heart can help with swelling and pain control.  Icing in a pulsatile fashion, such as 20 minutes on and 20 minutes off, can be followed.  Do not place ice directly on skin. Make sure there is a barrier between to skin and the ice pack.    Using frozen items such as frozen peas works well as the conform nicely to the are that needs to be iced.  USE AN ACE WRAP OR TED HOSE FOR SWELLING  CONTROL  In addition to icing and elevation, Ace wraps or TED hose are used to help limit and resolve swelling.  It is recommended to use Ace wraps or TED hose until you are informed to stop.    When using Ace Wraps start the wrapping distally (farthest away from the body) and wrap proximally (closer to the body)   Example: If you had surgery on your leg or thing and you do not have a splint on, start the ace wrap at the toes and work your way up to the thigh        If you had surgery on your upper extremity and do not have a splint on, start the ace wrap at your fingers and work your way up to the upper arm  IF YOU ARE IN A SPLINT OR CAST DO NOT REMOVE IT FOR ANY REASON   If your splint gets wet for any reason please contact the office immediately. You may shower in your splint or cast as long as you keep it dry.  This can be done by wrapping in a cast cover or garbage back (or similar)  Do Not stick any thing down your splint or cast such as pencils, money, or hangers to try and scratch yourself with.  If you feel itchy take benadryl as prescribed on the bottle for itching  IF YOU ARE IN A CAM BOOT (BLACK BOOT)  You may remove boot periodically. Perform daily dressing changes as noted below.  Wash the liner of the boot regularly and wear a sock when wearing the boot. It is recommended that you sleep in the boot until told otherwise   CALL THE OFFICE WITH ANY QUESTIONS OR CONCERNS: [phone]   VISIT OUR WEBSITE FOR ADDITIONAL INFORMATION: [email or web address]      Discharge Wound Care Instructions  Do NOT apply any ointments, solutions or lotions to pin sites or surgical wounds.  These prevent needed drainage and even though solutions like hydrogen peroxide kill bacteria, they also damage cells lining the pin sites that help fight infection.  Applying lotions or ointments can keep the wounds moist and can cause them to breakdown and open up as well. This can increase the risk for infection.  When in doubt call the office.  Surgical incisions should be dressed daily.  If any drainage is noted, use one layer of adaptic, then gauze, Kerlix, and an ace wrap.  Once the incision is completely dry and without drainage, it may be left open to air out.  Showering may begin 36-48 hours later.  Cleaning gently with soap and water.  Traumatic wounds should be dressed daily as well.    One layer of adaptic, gauze, Kerlix, then ace wrap.  The adaptic can be discontinued once the draining has ceased    If you have a wet to dry dressing: wet the gauze with saline the squeeze as much saline out so the gauze is moist (not soaking wet), place moistened gauze over wound, then place a dry gauze over the moist one, followed by Kerlix wrap, then ace wrap.

## 2019-05-19 ENCOUNTER — Encounter: Payer: Self-pay | Admitting: *Deleted

## 2019-05-26 ENCOUNTER — Ambulatory Visit: Payer: Medicare Other | Admitting: "Endocrinology

## 2019-05-26 NOTE — Discharge Summary (Signed)
Orthopaedic Trauma Service (OTS) Discharge Summary   Patient ID: Melissa Tucker MRN: [id number] DOB/AGE: 1951/02/23 69 y.o.  Admit date: 05/15/2019 Discharge date: 05/18/2019  Admission Diagnoses: 1.  Right distal femur fracture 2.  Right ankle fracture  Discharge Diagnoses:  Active Problems:   Closed fracture of right distal femur (HCC)   Closed displaced trimalleolar fracture of right ankle   Past Medical History:  Diagnosis Date  . Anxiety and depression   . Chest tightness   . Diabetes mellitus 1994   No insulin  . DJD (degenerative joint disease)   . History of DVT (deep vein thrombosis) 1989   Following childbirth  . Hyperlipidemia   . Hypertension   . IBS (irritable bowel syndrome)      Procedures Performed: 1. CPT 30160-FUXN reduction internal fixation of right supracondylar distal femur fracture 2. CPT 27822-Open reduction internal fixation of right trimalleolar ankle fracture  Discharged Condition: good/stable  Hospital Course: Patient presented to [facility] emergency department on 05/15/2019 after a fall at home.  Was found to have a right distal femur fracture as well as a right trimalleolar ankle fracture.  Patient was taken to the operating room by Dr. Doreatha Martin on 05/16/2019 for the above procedures.  She tolerated this well without complications.  Was placed in a walking boot to the right lower extremity, and allowed unrestricted range of motion of her knee and ankle.  She was instructed to be nonweightbearing on the right lower extremity postoperatively.  She was started on Lovenox for DVT prophylaxis starting on postoperative day #1.  Began working with physical and Occupational Therapy on postoperative day #1.  The remaining portion of her hospitalization was dedicated to achieving adequate pain control and increasing mobility. On 05/26/2019, the patient was tolerating diet, working well with therapies, pain well controlled, vital signs stable, dressings clean,  dry, intact and felt stable for discharge to  home. Patient will follow up as below and knows to call with questions or concerns.     Consults: None  Significant Diagnostic Studies:  Results for orders placed or performed during the hospital encounter of 05/15/19 (from the past 336 hour(s))  Basic metabolic panel   Collection Time: 05/15/19  3:05 PM  Result Value Ref Range   Sodium 135 135 - 145 mmol/L   Potassium 4.4 3.5 - 5.1 mmol/L   Chloride 99 98 - 111 mmol/L   CO2 26 22 - 32 mmol/L   Glucose, Bld 281 (H) 70 - 99 mg/dL   BUN 26 (H) 8 - 23 mg/dL   Creatinine, Ser 0.49 0.44 - 1.00 mg/dL   Calcium 9.7 8.9 - 10.3 mg/dL   GFR calc non Af Amer >60 >60 mL/min   GFR calc Af Amer >60 >60 mL/min   Anion gap 10 5 - 15  CBC with Differential   Collection Time: 05/15/19  3:05 PM  Result Value Ref Range   WBC 11.4 (H) 4.0 - 10.5 K/uL   RBC 3.89 3.87 - 5.11 MIL/uL   Hemoglobin 11.7 (L) 12.0 - 15.0 g/dL   HCT 37.0 36.0 - 46.0 %   MCV 95.1 80.0 - 100.0 fL   MCH 30.1 26.0 - 34.0 pg   MCHC 31.6 30.0 - 36.0 g/dL   RDW 11.1 (L) 11.5 - 15.5 %   Platelets 346 150 - 400 K/uL   nRBC 0.0 0.0 - 0.2 %   Neutrophils Relative % 80 %   Neutro Abs 9.0 (H) 1.7 - 7.7 K/uL  Lymphocytes Relative 16 %   Lymphs Abs 1.8 0.7 - 4.0 K/uL   Monocytes Relative 4 %   Monocytes Absolute 0.5 0.1 - 1.0 K/uL   Eosinophils Relative 0 %   Eosinophils Absolute 0.0 0.0 - 0.5 K/uL   Basophils Relative 0 %   Basophils Absolute 0.0 0.0 - 0.1 K/uL   Immature Granulocytes 0 %   Abs Immature Granulocytes 0.05 0.00 - 0.07 K/uL  SARS CORONAVIRUS 2 (TAT 6-24 HRS) Nasopharyngeal Nasopharyngeal Swab   Collection Time: 05/15/19  4:07 PM   Specimen: Nasopharyngeal Swab  Result Value Ref Range   SARS Coronavirus 2 NEGATIVE NEGATIVE  HIV Antibody (routine testing w rflx)   Collection Time: 05/15/19  7:36 PM  Result Value Ref Range   HIV Screen 4th Generation wRfx NON REACTIVE NON REACTIVE  Glucose, capillary   Collection  Time: 05/16/19  4:01 AM  Result Value Ref Range   Glucose-Capillary 306 (H) 70 - 99 mg/dL  Glucose, capillary   Collection Time: 05/16/19  6:36 AM  Result Value Ref Range   Glucose-Capillary 294 (H) 70 - 99 mg/dL  Surgical pcr screen   Collection Time: 05/16/19 10:59 AM   Specimen: Nasal Mucosa; Nasal Swab  Result Value Ref Range   MRSA, PCR NEGATIVE NEGATIVE   Staphylococcus aureus NEGATIVE NEGATIVE  Glucose, capillary   Collection Time: 05/16/19 11:21 AM  Result Value Ref Range   Glucose-Capillary 305 (H) 70 - 99 mg/dL  Glucose, capillary   Collection Time: 05/16/19 12:26 PM  Result Value Ref Range   Glucose-Capillary 302 (H) 70 - 99 mg/dL  Glucose, capillary   Collection Time: 05/16/19  3:12 PM  Result Value Ref Range   Glucose-Capillary 230 (H) 70 - 99 mg/dL   Comment 1 Notify RN   VITAMIN D 25 Hydroxy (Vit-D Deficiency, Fractures)   Collection Time: 05/16/19  4:37 PM  Result Value Ref Range   Vit D, 25-Hydroxy 29.46 (L) 30 - 100 ng/mL  Glucose, capillary   Collection Time: 05/16/19  4:37 PM  Result Value Ref Range   Glucose-Capillary 169 (H) 70 - 99 mg/dL  Glucose, capillary   Collection Time: 05/16/19  8:30 PM  Result Value Ref Range   Glucose-Capillary 175 (H) 70 - 99 mg/dL  Glucose, capillary   Collection Time: 05/17/19  6:32 AM  Result Value Ref Range   Glucose-Capillary 273 (H) 70 - 99 mg/dL  CBC   Collection Time: 05/17/19  7:14 AM  Result Value Ref Range   WBC 9.3 4.0 - 10.5 K/uL   RBC 3.04 (L) 3.87 - 5.11 MIL/uL   Hemoglobin 9.0 (L) 12.0 - 15.0 g/dL   HCT 28.2 (L) 36.0 - 46.0 %   MCV 92.8 80.0 - 100.0 fL   MCH 29.6 26.0 - 34.0 pg   MCHC 31.9 30.0 - 36.0 g/dL   RDW 11.3 (L) 11.5 - 15.5 %   Platelets 277 150 - 400 K/uL   nRBC 0.0 0.0 - 0.2 %  Glucose, capillary   Collection Time: 05/17/19 11:21 AM  Result Value Ref Range   Glucose-Capillary 282 (H) 70 - 99 mg/dL  Glucose, capillary   Collection Time: 05/17/19  4:30 PM  Result Value Ref Range    Glucose-Capillary 305 (H) 70 - 99 mg/dL  Glucose, capillary   Collection Time: 05/17/19  8:22 PM  Result Value Ref Range   Glucose-Capillary 191 (H) 70 - 99 mg/dL  Glucose, capillary   Collection Time: 05/18/19  6:34 AM  Result  Value Ref Range   Glucose-Capillary 210 (H) 70 - 99 mg/dL  Glucose, capillary   Collection Time: 05/18/19 11:19 AM  Result Value Ref Range   Glucose-Capillary 238 (H) 70 - 99 mg/dL    Treatments: surgery: 1. CPT 27511-Open reduction internal fixation of right supracondylar distal femur fracture 2. CPT 76226-JFHL reduction internal fixation of right trimalleolar ankle fracture  Discharge Exam: General: Sitting up in bed, no acute distress.  Awake alert and oriented. Right lower extremity: Neurovascular intact. Sensation intact distally. Intact pulses distally. Dorsiflexion/Plantar flexion intact  Disposition: Discharge disposition: 01-Home or Self Care       Discharge Instructions    Diet - low sodium heart healthy   Complete by: As directed    Increase activity slowly   Complete by: As directed      Allergies as of 05/18/2019      Reactions   Peanut-containing Drug Products Swelling      Medication List    STOP taking these medications   aspirin 325 MG EC tablet   HYDROcodone-acetaminophen 10-325 MG tablet Commonly known as: NORCO   Hydrocodone-Acetaminophen 10-660 MG Tabs   venlafaxine XR 37.5 MG 24 hr capsule Commonly known as: EFFEXOR-XR     TAKE these medications   Accu-Chek Guide test strip Generic drug: glucose blood Use as instructed   Accu-Chek Guide w/Device Kit 1 Piece by Does not apply route as directed.   ALPRAZolam 0.5 MG tablet Commonly known as: XANAX Take 0.5 mg by mouth 2 (two) times daily.   BIOTIN PO Take 1 tablet by mouth daily.   bisacodyl 5 MG EC tablet Commonly known as: DULCOLAX Take 1 tablet (5 mg total) by mouth daily as needed for moderate constipation.   colesevelam 625 MG tablet Commonly  known as: WELCHOL Take 1,875 mg by mouth 2 (two) times daily as needed (irritable bowel syndrome).   dicyclomine 10 MG capsule Commonly known as: BENTYL Take 10 mg by mouth daily.   enoxaparin 40 MG/0.4ML injection Commonly known as: LOVENOX Inject 0.4 mLs (40 mg total) into the skin daily.   escitalopram 10 MG tablet Commonly known as: LEXAPRO Take 10 mg by mouth daily.   furosemide 40 MG tablet Commonly known as: LASIX Take 40 mg by mouth daily.   gabapentin 100 MG capsule Commonly known as: NEURONTIN Take 1 capsule (100 mg total) by mouth 3 (three) times daily.   glipiZIDE XL 10 MG 24 hr tablet Generic drug: glipiZIDE TAKE 1 TABLET BY MOUTH ONCE A DAY. What changed:   how much to take  when to take this   INDOMETHACIN PO Take by mouth.   Invokana 300 MG Tabs tablet Generic drug: canagliflozin Take 300 mg by mouth daily as needed (for sugar level).   lisinopril 40 MG tablet Commonly known as: ZESTRIL Take 10 mg by mouth daily.   metFORMIN 1000 MG tablet Commonly known as: GLUCOPHAGE TAKE ONE TABLET BY MOUTH TWICE A DAY WITH A MEAL What changed: See the new instructions.   methocarbamol 500 MG tablet Commonly known as: ROBAXIN Take 1 tablet (500 mg total) by mouth every 6 (six) hours as needed for muscle spasms.   metoCLOPramide 5 MG tablet Commonly known as: REGLAN Take 5 mg by mouth 4 (four) times daily as needed for nausea or vomiting.   multivitamin tablet Take 1 tablet by mouth daily.   oxyCODONE-acetaminophen 5-325 MG tablet Commonly known as: Percocet Take 1 tablet by mouth every 4 (four) hours as needed for  severe pain.   simvastatin 40 MG tablet Commonly known as: ZOCOR Take 40 mg by mouth at bedtime.   Trulicity 9.57 MB/3.4YZ Sopn Generic drug: Dulaglutide INJECT 1 SYRINGE UNDER THE SKIN ONCE WEEKLY. What changed: See the new instructions.   vitamin B-12 1000 MCG tablet Commonly known as: CYANOCOBALAMIN Take 1,000 mcg by mouth  daily.   VITAMIN D PO Take 1 tablet by mouth daily.      Follow-up Information    Haddix, Thomasene Lot, MD. Schedule an appointment as soon as possible for a visit in 2 week(s).   Specialty: Orthopedic Surgery Why: for suture removal, repeat x-rays Contact information: [place] [address] [phone]        [facility] Follow up.   Specialty: Home Health Services Why: Physical therapist will contact you from this agency.  Contact information: [place] [address] [phone]           Discharge Instructions and Plan: Patient will be discharged to home with home health therapies. Will be discharged on Lovenox for DVT prophylaxis.  Due to history of previous DVT, should remain on this for a total of 6 weeks.  Patient has been provided with all the necessary DME for discharge. Patient will follow up with Dr. Doreatha Martin in 2 weeks for repeat x-rays and suture removal.   Signed:  Leary Roca. Carmie Kanner ?[phone] (phone) 05/26/2019, 3:26 PM  [facility] [place] [address] [phone] (F)

## 2019-06-09 ENCOUNTER — Other Ambulatory Visit: Payer: Self-pay

## 2019-06-09 ENCOUNTER — Encounter: Payer: Self-pay | Admitting: "Endocrinology

## 2019-06-09 ENCOUNTER — Ambulatory Visit (INDEPENDENT_AMBULATORY_CARE_PROVIDER_SITE_OTHER): Payer: Medicare Other | Admitting: "Endocrinology

## 2019-06-09 VITALS — BP 105/69 | HR 112 | Ht 69.0 in

## 2019-06-09 DIAGNOSIS — E1165 Type 2 diabetes mellitus with hyperglycemia: Secondary | ICD-10-CM

## 2019-06-09 LAB — POCT GLYCOSYLATED HEMOGLOBIN (HGB A1C): Hemoglobin A1C: 7 % — AB (ref 4.0–5.6)

## 2019-06-09 NOTE — Progress Notes (Signed)
06/09/2019, 5:20 PM        Endocrinology follow-up note   Subjective:    Patient ID: Melissa Tucker, female    DOB: 06-18-1950.  Melissa Tucker is seen in follow-up for management of currently uncontrolled symptomatic diabetes requested by  Celene Squibb, MD.   Past Medical History:  Diagnosis Date  . Anxiety and depression   . Chest tightness   . Diabetes mellitus 1994   No insulin  . DJD (degenerative joint disease)   . History of DVT (deep vein thrombosis) 1989   Following childbirth  . Hyperlipidemia   . Hypertension   . IBS (irritable bowel syndrome)    Past Surgical History:  Procedure Laterality Date  . ABDOMINAL HYSTERECTOMY    . CESAREAN SECTION  1988  . CHOLECYSTECTOMY    . COLONOSCOPY  2005   Normal findings  . ORIF ANKLE FRACTURE Right 05/16/2019   Procedure: OPEN REDUCTION INTERNAL FIXATION (ORIF) ANKLE FRACTURE;  Surgeon: Shona Needles, MD;  Location: [facility];  Service: Orthopedics;  Laterality: Right;  . ORIF FEMUR FRACTURE Right 05/16/2019   Procedure: OPEN REDUCTION INTERNAL FIXATION (ORIF) DISTAL FEMUR FRACTURE;  Surgeon: Shona Needles, MD;  Location: [facility];  Service: Orthopedics;  Laterality: Right;  . SHOULDER SURGERY     Left shoulder manipulation with subcrominal decompression, capsulitis  . TOTAL KNEE ARTHROPLASTY  2003   Left  . TOTAL KNEE ARTHROPLASTY  2007   Right   Social History   Socioeconomic History  . Marital status: Divorced    Spouse name: Not on file  . Number of children: Not on file  . Years of education: Not on file  . Highest education level: Not on file  Occupational History  . Occupation: disabled    Fish farm manager: UNEMPLOYED  Tobacco Use  . Smoking status: Former Smoker    Quit date: 04/10/1988    Years since quitting: 31.1  . Smokeless tobacco: Former Systems developer  . Tobacco comment: Minimal prior use  Substance and Sexual Activity  . Alcohol use: No  . Drug use: No  . Sexual activity: Yes  Other  Topics Concern  . Not on file  Social History Narrative   Divorced   Resides with daughter, also has 2 sons   No regular exercise   Social Determinants of Health   Financial Resource Strain:   . Difficulty of Paying Living Expenses: Not on file  Food Insecurity:   . Worried About Charity fundraiser in the Last Year: Not on file  . Ran Out of Food in the Last Year: Not on file  Transportation Needs:   . Lack of Transportation (Medical): Not on file  . Lack of Transportation (Non-Medical): Not on file  Physical Activity:   . Days of Exercise per Week: Not on file  . Minutes of Exercise per Session: Not on file  Stress:   . Feeling of Stress : Not on file  Social Connections:   . Frequency of Communication with Friends and Family: Not on file  . Frequency of Social Gatherings with Friends and Family: Not on file  . Attends Religious Services: Not on file  . Active Member of Clubs or Organizations: Not on file  . Attends Archivist Meetings: Not on file  . Marital Status: Not on file   Outpatient Encounter Medications as of 06/09/2019  Medication Sig  . ALPRAZolam (XANAX) 0.5 MG tablet Take  0.5 mg by mouth 2 (two) times daily.   Marland Kitchen BIOTIN PO Take 1 tablet by mouth daily.  . bisacodyl (DULCOLAX) 5 MG EC tablet Take 1 tablet (5 mg total) by mouth daily as needed for moderate constipation.  . Blood Glucose Monitoring Suppl (ACCU-CHEK GUIDE) w/Device KIT 1 Piece by Does not apply route as directed. (Patient not taking: Reported on 05/15/2019)  . colesevelam (WELCHOL) 625 MG tablet Take 1,875 mg by mouth 2 (two) times daily as needed (irritable bowel syndrome).   Marland Kitchen dicyclomine (BENTYL) 10 MG capsule Take 10 mg by mouth daily.    Marland Kitchen enoxaparin (LOVENOX) 40 MG/0.4ML injection Inject 0.4 mLs (40 mg total) into the skin daily.  Marland Kitchen escitalopram (LEXAPRO) 10 MG tablet Take 10 mg by mouth daily.  . furosemide (LASIX) 40 MG tablet Take 40 mg by mouth daily.    Marland Kitchen gabapentin (NEURONTIN) 100  MG capsule Take 1 capsule (100 mg total) by mouth 3 (three) times daily.  Marland Kitchen GLIPIZIDE XL 10 MG 24 hr tablet TAKE 1 TABLET BY MOUTH ONCE A DAY. (Patient taking differently: Take 10 mg by mouth daily with breakfast. )  . glucose blood (ACCU-CHEK GUIDE) test strip Use as instructed (Patient not taking: Reported on 05/15/2019)  . INDOMETHACIN PO Take by mouth.  Marland Kitchen lisinopril (PRINIVIL,ZESTRIL) 40 MG tablet Take 10 mg by mouth daily.   . metFORMIN (GLUCOPHAGE) 1000 MG tablet TAKE ONE TABLET BY MOUTH TWICE A DAY WITH A MEAL (Patient taking differently: Take 1,000 mg by mouth 2 (two) times daily with a meal. TAKE ONE TABLET BY MOUTH TWICE A DAY WITH A MEAL)  . methocarbamol (ROBAXIN) 500 MG tablet Take 1 tablet (500 mg total) by mouth every 6 (six) hours as needed for muscle spasms.  . metoCLOPramide (REGLAN) 5 MG tablet Take 5 mg by mouth 4 (four) times daily as needed for nausea or vomiting.   . Multiple Vitamin (MULTIVITAMIN) tablet Take 1 tablet by mouth daily.  Marland Kitchen oxyCODONE-acetaminophen (PERCOCET) 5-325 MG tablet Take 1 tablet by mouth every 4 (four) hours as needed for severe pain.  . simvastatin (ZOCOR) 40 MG tablet Take 40 mg by mouth at bedtime.    . TRULICITY 2.19 XJ/8.8TG SOPN INJECT 1 SYRINGE UNDER THE SKIN ONCE WEEKLY. (Patient taking differently: Inject 0.75 mg into the skin once a week. )  . vitamin B-12 (CYANOCOBALAMIN) 1000 MCG tablet Take 1,000 mcg by mouth daily.  Marland Kitchen VITAMIN D PO Take 1 tablet by mouth daily.  . [DISCONTINUED] INVOKANA 300 MG TABS tablet Take 300 mg by mouth daily as needed (for sugar level).    No facility-administered encounter medications on file as of 06/09/2019.    ALLERGIES: Allergies  Allergen Reactions  . Peanut-Containing Drug Products Swelling    VACCINATION STATUS:  There is no immunization history on file for this patient.  Diabetes She presents for her follow-up diabetic visit. She has type 2 diabetes mellitus. Onset time: She was diagnosed at  approximate age of 72 years. Her disease course has been improving. There are no hypoglycemic associated symptoms. Pertinent negatives for hypoglycemia include no confusion, headaches, pallor or seizures. Pertinent negatives for diabetes include no blurred vision, no chest pain, no fatigue, no polydipsia, no polyphagia and no polyuria. There are no hypoglycemic complications. Symptoms are improving. Diabetic complications include retinopathy. Risk factors for coronary artery disease include diabetes mellitus, dyslipidemia, family history, hypertension, obesity, sedentary lifestyle, post-menopausal and tobacco exposure. Current diabetic treatments: She is currently on Invokana 300 mg  p.o. daily metformin/glipizide,\ She is compliant with treatment some of the time. Her weight is decreasing steadily. She is following a generally unhealthy diet. When asked about meal planning, she reported none. She has not had a previous visit with a dietitian. She never participates in exercise. Her overall blood glucose range is >200 mg/dl. (She not bring any logs nor meter with her today.  She denies hypoglycemia.  Her blood glucose ranges between 100 to 165 mg per DL.  Her point-of-care A1c was 7%, improving from 9.6%.  ) An ACE inhibitor/angiotensin II receptor blocker is being taken. Eye exam is current.  Hyperlipidemia This is a chronic problem. The current episode started more than 1 year ago. The problem is controlled. Exacerbating diseases include diabetes and obesity. Pertinent negatives include no chest pain, myalgias or shortness of breath. Current antihyperlipidemic treatment includes statins and ezetimibe. Risk factors for coronary artery disease include dyslipidemia, diabetes mellitus, family history, obesity, hypertension, a sedentary lifestyle and post-menopausal.  Hypertension This is a chronic problem. The current episode started more than 1 year ago. The problem is uncontrolled. Pertinent negatives include no  blurred vision, chest pain, headaches, palpitations or shortness of breath. Risk factors for coronary artery disease include diabetes mellitus, dyslipidemia, family history, obesity, post-menopausal state, sedentary lifestyle and smoking/tobacco exposure. Past treatments include ACE inhibitors. Hypertensive end-organ damage includes retinopathy.    Review of systems  Constitutional: + Minimally fluctuating body weight,  current  Body mass index is 29.39 kg/m. , no fatigue, no subjective hyperthermia, no subjective hypothermia Eyes: no blurry vision, no xerophthalmia ENT: no sore throat, no nodules palpated in throat, no dysphagia/odynophagia, no hoarseness Cardiovascular: no Chest Pain, no Shortness of Breath, no palpitations, no leg swelling Respiratory: no cough, no shortness of breath Gastrointestinal: no Nausea/Vomiting/Diarhhea Musculoskeletal: no muscle/joint aches Skin: no rashes, no hyperemia Neurological: no tremors, no numbness, no tingling, no dizziness Psychiatric: no depression, no anxiety    Objective:    BP 105/69   Pulse (!) 112   Ht '5\' 9"'  (1.753 m)   BMI 29.39 kg/m   Wt Readings from Last 3 Encounters:  05/16/19 199 lb (90.3 kg)  02/10/19 203 lb (92.1 kg)  06/10/18 211 lb 12.8 oz (96.1 kg)      Physical Exam- Limited  Constitutional:  Body mass index is 29.39 kg/m. , not in acute distress, normal state of mind Eyes:  EOMI, no exophthalmos Neck: Supple Thyroid: No gross goiter Respiratory: Adequate breathing efforts Musculoskeletal: + Right lower extremity boots after distal femur fracture on May 15, 2019.    Skin:  no rashes, no hyperemia Neurological: no tremor with outstretched hands,   CMP     Component Value Date/Time   NA 135 05/15/2019 1505   K 4.4 05/15/2019 1505   CL 99 05/15/2019 1505   CO2 26 05/15/2019 1505   GLUCOSE 281 (H) 05/15/2019 1505   BUN 26 (H) 05/15/2019 1505   BUN 11 09/07/2017 0000   CREATININE 0.49 05/15/2019 1505    CREATININE 0.64 08/14/2018 1142   CALCIUM 9.7 05/15/2019 1505   PROT 6.7 08/14/2018 1142   ALBUMIN 4.7 09/13/2009 0000   AST 15 08/14/2018 1142   ALT 23 08/14/2018 1142   ALKPHOS 100 09/13/2009 0000   BILITOT 0.4 08/14/2018 1142   GFRNONAA >60 05/15/2019 1505   GFRNONAA 92 08/14/2018 1142   GFRAA >60 05/15/2019 1505   GFRAA 107 08/14/2018 1142     Diabetic Labs (most recent): Lab Results  Component Value Date  HGBA1C 7.0 (A) 06/09/2019   HGBA1C 9.6 (A) 02/10/2019   HGBA1C 10.3 (H) 08/14/2018     Lipid Panel ( most recent) Lipid Panel     Component Value Date/Time   CHOL 136 09/07/2017 0000   TRIG 116 09/07/2017 0000   HDL 51 09/07/2017 0000   LDLCALC 65 09/07/2017 0000     Assessment & Plan:   1. Uncontrolled type 2 diabetes mellitus with hyperglycemia ([place])  - Melissa Tucker has currently uncontrolled symptomatic type 2 DM since 69 years of age.  Her point-of-care A1c today 7%, improved from 9.6%.  She denies hypoglycemia.  -her diabetes is complicated by retinopathy, obesity/sedentary life, history of smoking and Melissa Tucker remains at a high risk for more acute and chronic complications which include CAD, CVA, CKD, retinopathy, and neuropathy. These are all discussed in detail with the patient.  - I have counseled her on diet management and weight loss, by adopting a carbohydrate restricted/protein rich diet.  - she  admits there is a room for improvement in her diet and drink choices. -  Suggestion is made for her to avoid simple carbohydrates  from her diet including Cakes, Sweet Desserts / Pastries, Ice Cream, Soda (diet and regular), Sweet Tea, Candies, Chips, Cookies, Sweet Pastries,  Store Bought Juices, Alcohol in Excess of  1-2 drinks a day, Artificial Sweeteners, Coffee Creamer, and "Sugar-free" Products. This will help patient to have stable blood glucose profile and potentially avoid unintended weight gain.   - I encouraged her to switch to   unprocessed or minimally processed complex starch and increased protein intake (animal or plant source), fruits, and vegetables.  - she is advised to stick to a routine mealtimes to eat 3 meals  a day and avoid unnecessary snacks ( to snack only to correct hypoglycemia).   - she has been scheduled with Jearld Fenton, RDN, CDE for individualized diabetes education.  - I have approached her with the following individualized plan to manage diabetes and patient agrees:   -Based on her presentation with significantly improved A1c of 7%, she will not need insulin treatment for now.    She has responded to Trulicity treatment.  She is advised to continue Trulicity 5.40 mg subcutaneously weekly, continue Metformin 1000 mg p.o. twice daily, continue glipizide 10 mg p.o. daily.    -She is advised to monitor blood glucose at least daily before breakfast   -She is encouraged to call clinic for blood glucose readings less than 70 or greater than 200x3.      2) BP/HTN: Her blood pressure is controlled to target.    She is advised to continue her current medications including lisinopril 40 mg p.o. daily, Lasix 40 mg p.o. daily . She will be considered for additional treatment with hydrochlorothiazide on her next visit.   3) Lipids/HPL: Her recent lipid panel showed controlled LDL at 65.   She is advised to continue simvastatin 40 mg p.o. nightly.      4)  Weight/Diet: CDE Consult will be initiated , exercise, and detailed carbohydrates information provided.  5) Chronic Care/Health Maintenance:  -she  is on ACEI/ARB and Statin medications and  is encouraged to initiate and continue to follow up with Ophthalmology, Dentist,  Podiatrist at least yearly or according to recommendations, and advised to  stay away from smoking. I have recommended yearly flu vaccine and pneumonia vaccine at least every 5 years; moderate intensity exercise for up to 150 minutes weekly; and  sleep for  at least 7 hours a  day.  - I advised patient to maintain close follow up with Celene Squibb, MD for primary care needs.  - Time spent on this patient care encounter:  35 min, of which > 50% was spent in  counseling and the rest reviewing her blood glucose logs , discussing her hypoglycemia and hyperglycemia episodes, reviewing her current and  previous labs / studies  ( including abstraction from other facilities) and medications  doses and developing a  long term treatment plan and documenting her care.   Please refer to Patient Instructions for Blood Glucose Monitoring and Insulin/Medications Dosing Guide"  in media tab for additional information. Please  also refer to " Patient Self Inventory" in the Media  tab for reviewed elements of pertinent patient history.  Melissa Tucker participated in the discussions, expressed understanding, and voiced agreement with the above plans.  All questions were answered to her satisfaction. she is encouraged to contact clinic should she have any questions or concerns prior to her return visit.   Follow up plan: - Return in about 4 months (around 10/09/2019) for Bring Meter and Logs- A1c in Office.  Glade Lloyd, MD [facility] [address] [place], [place] [address] Phone: [phone]  Fax: [phone]    06/09/2019, 5:20 PM  This note was partially dictated with voice recognition software. Similar sounding words can be transcribed inadequately or may not  be corrected upon review.

## 2019-06-09 NOTE — Patient Instructions (Signed)

## 2019-06-16 ENCOUNTER — Other Ambulatory Visit: Payer: Self-pay | Admitting: "Endocrinology

## 2019-07-03 ENCOUNTER — Other Ambulatory Visit: Payer: Self-pay | Admitting: "Endocrinology

## 2019-07-08 ENCOUNTER — Ambulatory Visit: Payer: Medicare Other | Admitting: "Endocrinology

## 2019-07-18 IMAGING — XA Imaging study
2 series · 2 of 2 positions shown · non-contrast
Comparison: none

CLINICAL DATA: Lumbosacral spondylosis without myelopathy. Low back
and lateral left leg pain.

[Series 1: ortho adipose · 1 of 1 slices shown (1 of 2)]
[im 1/1]
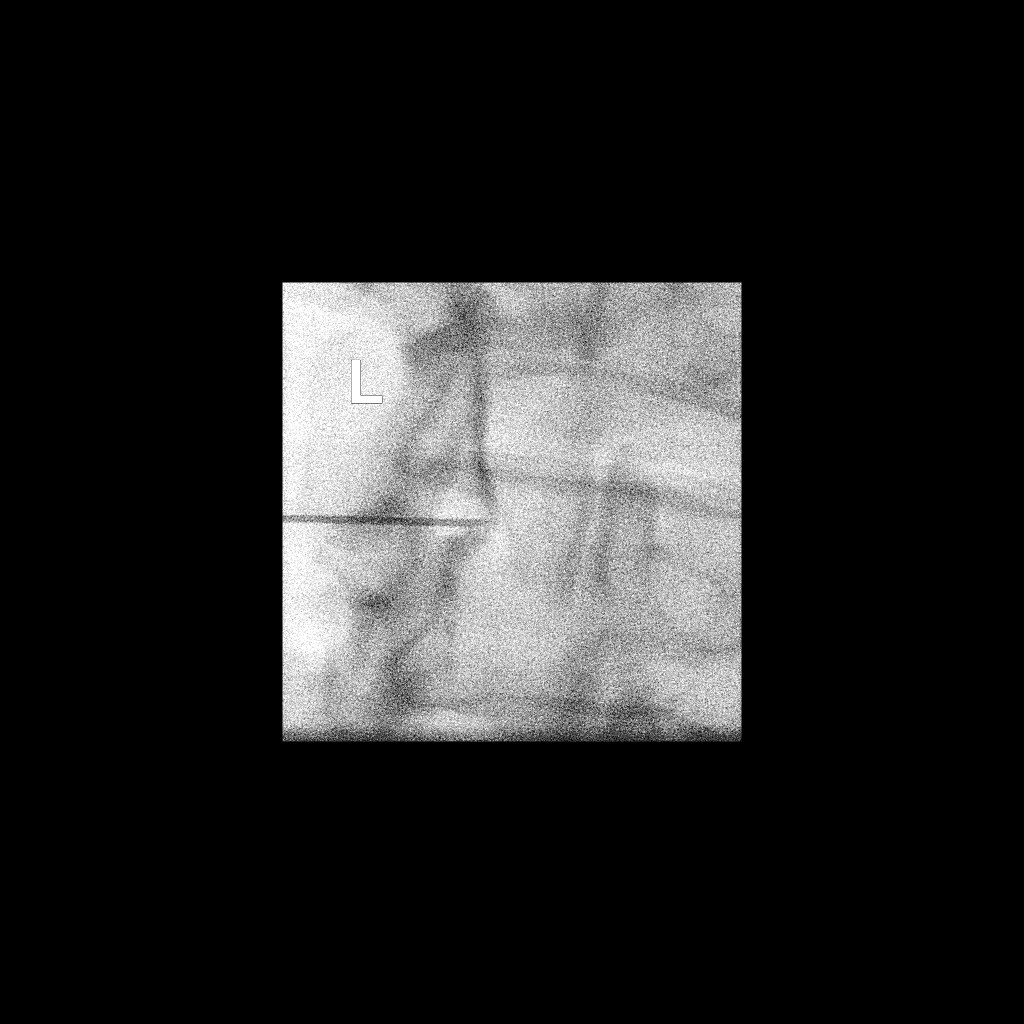

[Series 3: ortho adipose · 1 of 1 slices shown (2 of 2)]
[im 1/1]
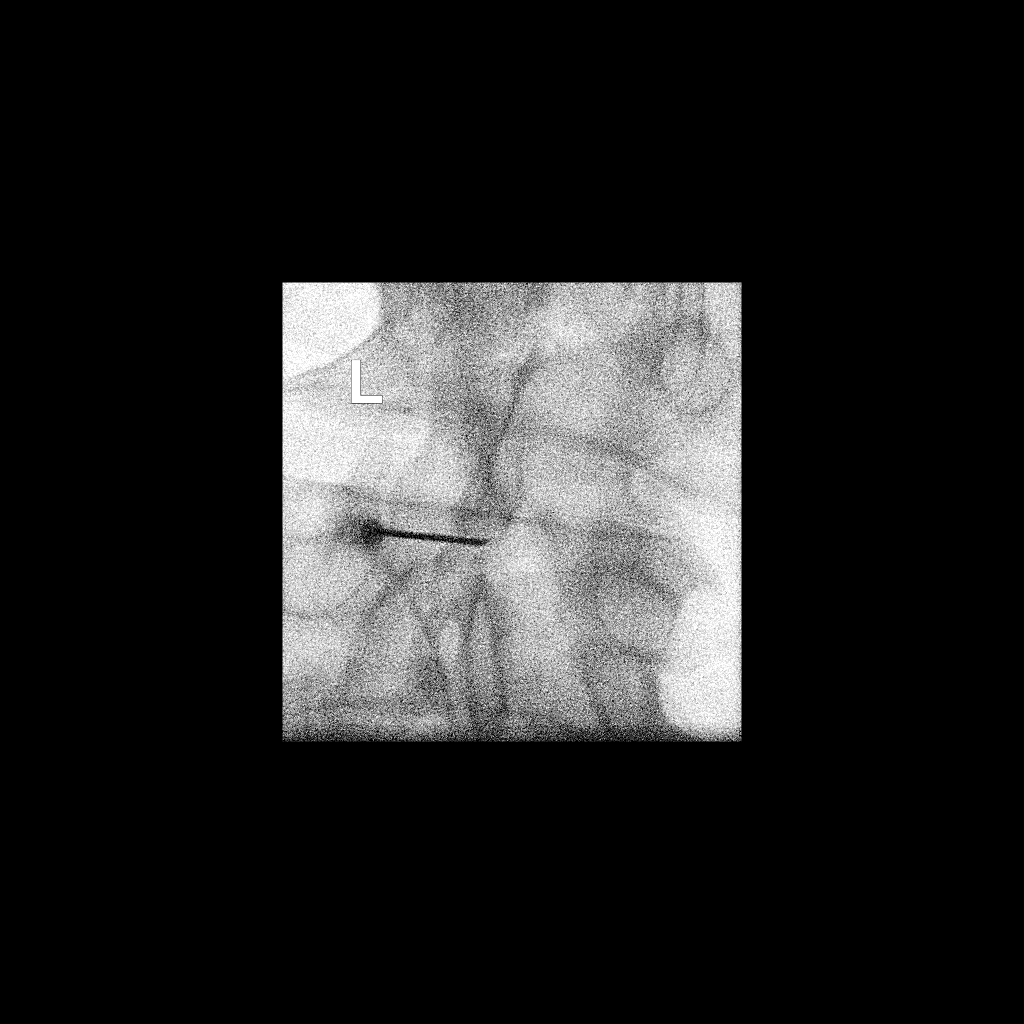

[2 of 2 positions shown; findings below may reference images not displayed]

FLUOROSCOPY TIME:  Fluoroscopy Time: 13 seconds

Radiation Exposure Index: 23.73 microGray*m^2

PROCEDURE:
The procedure, risks, benefits, and alternatives were explained to
the patient. Questions regarding the procedure were encouraged and
answered. The patient understands and consents to the procedure.

LUMBAR EPIDURAL INJECTION:

An interlaminar approach was performed on the left at L3-4. The
overlying skin was cleansed and anesthetized. A 3.5 inch 20 gauge
epidural needle was advanced using loss-of-resistance technique.

DIAGNOSTIC EPIDURAL INJECTION:

Injection of Isovue-M 200 shows a good epidural pattern with spread
above and below the level of needle placement, primarily on the
left. No vascular opacification is seen.

THERAPEUTIC EPIDURAL INJECTION:

120 mg of Depo-Medrol mixed with 3 mL of 1% lidocaine were
instilled. The procedure was well-tolerated, and the patient was
discharged thirty minutes following the injection in good condition.

COMPLICATIONS:
None
IMPRESSION: Technically successful lumbar interlaminar epidural injection on the
left at L3-4.

## 2019-07-22 ENCOUNTER — Ambulatory Visit: Payer: Medicare Other | Admitting: "Endocrinology

## 2019-08-20 LAB — HEMOGLOBIN A1C: Hemoglobin A1C: 7.9

## 2019-09-02 ENCOUNTER — Other Ambulatory Visit: Payer: Self-pay | Admitting: "Endocrinology

## 2019-09-18 ENCOUNTER — Other Ambulatory Visit: Payer: Self-pay | Admitting: "Endocrinology

## 2019-09-19 ENCOUNTER — Other Ambulatory Visit: Payer: Self-pay | Admitting: "Endocrinology

## 2019-10-03 ENCOUNTER — Other Ambulatory Visit: Payer: Self-pay | Admitting: "Endocrinology

## 2019-10-06 ENCOUNTER — Other Ambulatory Visit: Payer: Self-pay | Admitting: "Endocrinology

## 2019-10-20 ENCOUNTER — Other Ambulatory Visit: Payer: Self-pay | Admitting: "Endocrinology

## 2019-11-05 ENCOUNTER — Encounter: Payer: Self-pay | Admitting: "Endocrinology

## 2019-11-05 ENCOUNTER — Ambulatory Visit: Payer: Medicare Other | Admitting: "Endocrinology

## 2019-11-05 ENCOUNTER — Other Ambulatory Visit: Payer: Self-pay

## 2019-11-05 ENCOUNTER — Other Ambulatory Visit: Payer: Self-pay | Admitting: "Endocrinology

## 2019-11-05 ENCOUNTER — Ambulatory Visit (INDEPENDENT_AMBULATORY_CARE_PROVIDER_SITE_OTHER): Payer: Medicare Other | Admitting: "Endocrinology

## 2019-11-05 VITALS — BP 144/78 | HR 84 | Ht 69.0 in | Wt 207.4 lb

## 2019-11-05 DIAGNOSIS — E782 Mixed hyperlipidemia: Secondary | ICD-10-CM

## 2019-11-05 DIAGNOSIS — E1165 Type 2 diabetes mellitus with hyperglycemia: Secondary | ICD-10-CM | POA: Diagnosis not present

## 2019-11-05 DIAGNOSIS — I10 Essential (primary) hypertension: Secondary | ICD-10-CM

## 2019-11-05 MED ORDER — TRULICITY 1.5 MG/0.5ML ~~LOC~~ SOAJ: 1.5000 mg | SUBCUTANEOUS | 2 refills | Status: DC

## 2019-11-05 NOTE — Progress Notes (Signed)
11/05/2019, 7:04 PM        Endocrinology follow-up note   Subjective:    Patient ID: Melissa Tucker, female    DOB: [birth year].  Melissa Tucker is seen in follow-up for management of currently uncontrolled symptomatic diabetes requested by  Celene Squibb, MD.   Past Medical History:  Diagnosis Date  . Anxiety and depression   . Chest tightness   . Diabetes mellitus 1994   No insulin  . DJD (degenerative joint disease)   . History of DVT (deep vein thrombosis) 1989   Following childbirth  . Hyperlipidemia   . Hypertension   . IBS (irritable bowel syndrome)    Past Surgical History:  Procedure Laterality Date  . ABDOMINAL HYSTERECTOMY    . CESAREAN SECTION  1988  . CHOLECYSTECTOMY    . COLONOSCOPY  2005   Normal findings  . ORIF ANKLE FRACTURE Right 05/16/2019   Procedure: OPEN REDUCTION INTERNAL FIXATION (ORIF) ANKLE FRACTURE;  Surgeon: Shona Needles, MD;  Location: [place];  Service: Orthopedics;  Laterality: Right;  . ORIF FEMUR FRACTURE Right 05/16/2019   Procedure: OPEN REDUCTION INTERNAL FIXATION (ORIF) DISTAL FEMUR FRACTURE;  Surgeon: Shona Needles, MD;  Location: [place];  Service: Orthopedics;  Laterality: Right;  . SHOULDER SURGERY     Left shoulder manipulation with subcrominal decompression, capsulitis  . TOTAL KNEE ARTHROPLASTY  2003   Left  . TOTAL KNEE ARTHROPLASTY  2007   Right   Social History   Socioeconomic History  . Marital status: Divorced    Spouse name: Not on file  . Number of children: Not on file  . Years of education: Not on file  . Highest education level: Not on file  Occupational History  . Occupation: disabled    Fish farm manager: UNEMPLOYED  Tobacco Use  . Smoking status: Former Smoker    Quit date: 04/10/1988    Years since quitting: 31.5  . Smokeless tobacco: Former Systems developer  . Tobacco comment: Minimal prior use  Vaping Use  . Vaping Use: Never used  Substance and Sexual Activity  . Alcohol use: No  . Drug use:  No  . Sexual activity: Yes  Other Topics Concern  . Not on file  Social History Narrative   Divorced   Resides with daughter, also has 2 sons   No regular exercise   Social Determinants of Health   Financial Resource Strain:   . Difficulty of Paying Living Expenses:   Food Insecurity:   . Worried About Charity fundraiser in the Last Year:   . Arboriculturist in the Last Year:   Transportation Needs:   . Film/video editor (Medical):   Marland Kitchen Lack of Transportation (Non-Medical):   Physical Activity:   . Days of Exercise per Week:   . Minutes of Exercise per Session:   Stress:   . Feeling of Stress :   Social Connections:   . Frequency of Communication with Friends and Family:   . Frequency of Social Gatherings with Friends and Family:   . Attends Religious Services:   . Active Member of Clubs or Organizations:   . Attends Archivist Meetings:   Marland Kitchen Marital Status:    Outpatient Encounter Medications as of 11/05/2019  Medication Sig  . ALPRAZolam (XANAX) 0.5 MG tablet Take 0.5 mg by mouth 2 (two) times daily.   Marland Kitchen BIOTIN PO Take 1 tablet by mouth daily.  Marland Kitchen  bisacodyl (DULCOLAX) 5 MG EC tablet Take 1 tablet (5 mg total) by mouth daily as needed for moderate constipation.  . Blood Glucose Monitoring Suppl (ACCU-CHEK GUIDE) w/Device KIT 1 Piece by Does not apply route as directed. (Patient not taking: Reported on 05/15/2019)  . colesevelam (WELCHOL) 625 MG tablet Take 1,875 mg by mouth 2 (two) times daily as needed (irritable bowel syndrome).   Marland Kitchen dicyclomine (BENTYL) 10 MG capsule Take 10 mg by mouth daily.    . Dulaglutide (TRULICITY) 1.5 HG/9.9ME SOPN Inject 0.5 mLs (1.5 mg total) into the skin once a week.  . escitalopram (LEXAPRO) 10 MG tablet Take 10 mg by mouth daily.  Marland Kitchen GLIPIZIDE XL 10 MG 24 hr tablet TAKE 1 TABLET BY MOUTH ONCE A DAY.  Marland Kitchen glucose blood (ACCU-CHEK GUIDE) test strip Use as instructed (Patient not taking: Reported on 05/15/2019)  . INDOMETHACIN PO Take by  mouth.  Marland Kitchen lisinopril (PRINIVIL,ZESTRIL) 40 MG tablet Take 10 mg by mouth daily.   . metFORMIN (GLUCOPHAGE) 1000 MG tablet TAKE ONE TABLET BY MOUTH TWICE A DAY WITH A MEAL  . metoCLOPramide (REGLAN) 5 MG tablet Take 5 mg by mouth 4 (four) times daily as needed for nausea or vomiting.   . Multiple Vitamin (MULTIVITAMIN) tablet Take 1 tablet by mouth daily.  . simvastatin (ZOCOR) 40 MG tablet Take 40 mg by mouth at bedtime.    . vitamin B-12 (CYANOCOBALAMIN) 1000 MCG tablet Take 1,000 mcg by mouth daily.  Marland Kitchen VITAMIN D PO Take 1 tablet by mouth daily.  . [DISCONTINUED] enoxaparin (LOVENOX) 40 MG/0.4ML injection Inject 0.4 mLs (40 mg total) into the skin daily.  . [DISCONTINUED] furosemide (LASIX) 40 MG tablet Take 40 mg by mouth daily.    . [DISCONTINUED] gabapentin (NEURONTIN) 100 MG capsule Take 1 capsule (100 mg total) by mouth 3 (three) times daily.  . [DISCONTINUED] methocarbamol (ROBAXIN) 500 MG tablet Take 1 tablet (500 mg total) by mouth every 6 (six) hours as needed for muscle spasms.  . [DISCONTINUED] oxyCODONE-acetaminophen (PERCOCET) 5-325 MG tablet Take 1 tablet by mouth every 4 (four) hours as needed for severe pain.  . [DISCONTINUED] TRULICITY 2.68 TM/1.9QQ SOPN INJECT 1 SYRINGE UNDER THE SKIN ONCE WEEKLY.   No facility-administered encounter medications on file as of 11/05/2019.    ALLERGIES: Allergies  Allergen Reactions  . Peanut-Containing Drug Products Swelling    VACCINATION STATUS:  There is no immunization history on file for this patient.  Diabetes She presents for her follow-up diabetic visit. She has type 2 diabetes mellitus. Onset time: She was diagnosed at approximate age of 67 years. Her disease course has been worsening. There are no hypoglycemic associated symptoms. Pertinent negatives for hypoglycemia include no confusion, headaches, pallor or seizures. Pertinent negatives for diabetes include no blurred vision, no chest pain, no fatigue, no polydipsia, no  polyphagia and no polyuria. There are no hypoglycemic complications. Symptoms are worsening. Diabetic complications include retinopathy. Risk factors for coronary artery disease include diabetes mellitus, dyslipidemia, family history, hypertension, obesity, sedentary lifestyle, post-menopausal and tobacco exposure. Current diabetic treatments: She is currently on Invokana 300 mg p.o. daily metformin/glipizide,\ She is compliant with treatment some of the time. Her weight is increasing steadily. She is following a generally unhealthy diet. When asked about meal planning, she reported none. She has not had a previous visit with a dietitian. She never participates in exercise. (She did not bring any logs nor meter.  She denies hypoglycemia.  Her point-of-care A1c is 7.9% increasing from  7%.   ) An ACE inhibitor/angiotensin II receptor blocker is being taken. Eye exam is current.  Hyperlipidemia This is a chronic problem. The current episode started more than 1 year ago. The problem is controlled. Exacerbating diseases include diabetes and obesity. Pertinent negatives include no chest pain, myalgias or shortness of breath. Current antihyperlipidemic treatment includes statins and ezetimibe. Risk factors for coronary artery disease include dyslipidemia, diabetes mellitus, family history, obesity, hypertension, a sedentary lifestyle and post-menopausal.  Hypertension This is a chronic problem. The current episode started more than 1 year ago. The problem is uncontrolled. Pertinent negatives include no blurred vision, chest pain, headaches, palpitations or shortness of breath. Risk factors for coronary artery disease include diabetes mellitus, dyslipidemia, family history, obesity, post-menopausal state, sedentary lifestyle and smoking/tobacco exposure. Past treatments include ACE inhibitors. Hypertensive end-organ damage includes retinopathy.    Review of systems  Constitutional: + Gained weight,   current  Body  mass index is 30.63 kg/m. , no fatigue, no subjective hyperthermia, no subjective hypothermia Eyes: no blurry vision, no xerophthalmia ENT: no sore throat, no nodules palpated in throat, no dysphagia/odynophagia, no hoarseness Cardiovascular: no Chest Pain, no Shortness of Breath, no palpitations, no leg swelling Respiratory: no cough, no shortness of breath Gastrointestinal: no Nausea/Vomiting/Diarhhea Musculoskeletal: no muscle/joint aches Skin: no rashes, no hyperemia Neurological: no tremors, no numbness, no tingling, no dizziness Psychiatric: no depression, no anxiety    Objective:    BP (!) 144/78   Pulse 84   Ht '5\' 9"'  (1.753 m)   Wt (!) 207 lb 6.4 oz (94.1 kg)   BMI 30.63 kg/m   Wt Readings from Last 3 Encounters:  11/05/19 (!) 207 lb 6.4 oz (94.1 kg)  05/16/19 199 lb (90.3 kg)  02/10/19 203 lb (92.1 kg)      Physical Exam- Limited  Constitutional:  Body mass index is 30.63 kg/m. , not in acute distress, normal state of mind Eyes:  EOMI, no exophthalmos Neck: Supple Thyroid: No gross goiter Respiratory: Adequate breathing efforts Musculoskeletal: + Right lower extremity boots after distal femur fracture on May 15, 2019.    Skin:  no rashes, no hyperemia Neurological: no tremor with outstretched hands,   CMP     Component Value Date/Time   NA 135 05/15/2019 1505   K 4.4 05/15/2019 1505   CL 99 05/15/2019 1505   CO2 26 05/15/2019 1505   GLUCOSE 281 (H) 05/15/2019 1505   BUN 26 (H) 05/15/2019 1505   BUN 11 09/07/2017 0000   CREATININE 0.49 05/15/2019 1505   CREATININE 0.64 08/14/2018 1142   CALCIUM 9.7 05/15/2019 1505   PROT 6.7 08/14/2018 1142   ALBUMIN 4.7 09/13/2009 0000   AST 15 08/14/2018 1142   ALT 23 08/14/2018 1142   ALKPHOS 100 09/13/2009 0000   BILITOT 0.4 08/14/2018 1142   GFRNONAA >60 05/15/2019 1505   GFRNONAA 92 08/14/2018 1142   GFRAA >60 05/15/2019 1505   GFRAA 107 08/14/2018 1142     Diabetic Labs (most recent): Lab Results   Component Value Date   HGBA1C 7.9 08/20/2019   HGBA1C 7.0 (A) 06/09/2019   HGBA1C 9.6 (A) 02/10/2019     Lipid Panel ( most recent) Lipid Panel     Component Value Date/Time   CHOL 136 09/07/2017 0000   TRIG 116 09/07/2017 0000   HDL 51 09/07/2017 0000   LDLCALC 65 09/07/2017 0000     Assessment & Plan:   1. Uncontrolled type 2 diabetes mellitus with hyperglycemia (Melissa Tucker)  -  Melissa Tucker has currently uncontrolled symptomatic type 2 DM since 69 years of age.  She did not bring any logs nor meter.  She denies hypoglycemia.  Her point-of-care A1c is 7.9% increasing from 7%.   -her diabetes is complicated by retinopathy, obesity/sedentary life, history of smoking and Melissa Tucker remains at a high risk for more acute and chronic complications which include CAD, CVA, CKD, retinopathy, and neuropathy. These are all discussed in detail with the patient.  - I have counseled her on diet management and weight loss, by adopting a carbohydrate restricted/protein rich diet. - she  admits there is a room for improvement in her diet and drink choices. -  Suggestion is made for her to avoid simple carbohydrates  from her diet including Cakes, Sweet Desserts / Pastries, Ice Cream, Soda (diet and regular), Sweet Tea, Candies, Chips, Cookies, Sweet Pastries,  Store Bought Juices, Alcohol in Excess of  1-2 drinks a day, Artificial Sweeteners, Coffee Creamer, and "Sugar-free" Products. This will help patient to have stable blood glucose profile and potentially avoid unintended weight gain.   - I encouraged her to switch to  unprocessed or minimally processed complex starch and increased protein intake (animal or plant source), fruits, and vegetables.  - she is advised to stick to a routine mealtimes to eat 3 meals  a day and avoid unnecessary snacks ( to snack only to correct hypoglycemia).   - she has been scheduled with Jearld Fenton, RDN, CDE for individualized diabetes education.  - I have  approached her with the following individualized plan to manage diabetes and patient agrees:   -In light of her presentation with A1c of 7.9%, she will not need insulin treatment for now.    -She has responded to Trulicity treatment.  I discussed and increase her Trulicity to 1.5 mg subcutaneously weekly,   advised to continue Metformin 1000 mg p.o. twice daily, continue glipizide 10 mg p.o. daily.    -She is advised to monitor blood glucose at least daily before breakfast   -She is encouraged to call clinic for blood glucose readings less than 70 or greater than 200x3.      2) BP/HTN:  Her blood pressures not controlled to target.  She is advised to continue her current medications including lisinopril 40 mg p.o. daily, Lasix 40 mg p.o. daily . She will be considered for additional treatment with hydrochlorothiazide on her next visit.   3) Lipids/HPL: Her recent lipid panel showed controlled LDL at 65.  She is advised to continue simvastatin 40 mg p.o. nightly.     4)  Weight/Diet: Her BMI is 30.6 .  She is a candidate for modest weight loss.  CDE Consult will be initiated , exercise, and detailed carbohydrates information provided.  5) Chronic Care/Health Maintenance:  -she  is on ACEI/ARB and Statin medications and  is encouraged to initiate and continue to follow up with Ophthalmology, Dentist,  Podiatrist at least yearly or according to recommendations, and advised to  stay away from smoking. I have recommended yearly flu vaccine and pneumonia vaccine at least every 5 years; moderate intensity exercise for up to 150 minutes weekly; and  sleep for at least 7 hours a day.  - I advised patient to maintain close follow up with Celene Squibb, MD for primary care needs.  - Time spent on this patient care encounter:  35 min, of which > 50% was spent in  counseling and the rest reviewing her blood glucose  logs , discussing her hypoglycemia and hyperglycemia episodes, reviewing her current and   previous labs / studies  ( including abstraction from other facilities) and medications  doses and developing a  long term treatment plan and documenting her care.   Please refer to Patient Instructions for Blood Glucose Monitoring and Insulin/Medications Dosing Guide"  in media tab for additional information. Please  also refer to " Patient Self Inventory" in the Media  tab for reviewed elements of pertinent patient history.  Melissa Tucker participated in the discussions, expressed understanding, and voiced agreement with the above plans.  All questions were answered to her satisfaction. she is encouraged to contact clinic should she have any questions or concerns prior to her return visit.    Follow up plan: - Return in about 4 months (around 03/07/2020) for Bring Meter and Logs- A1c in Office, NV A1c in Office.  Glade Lloyd, MD [facility] [address] [place], [place] [address] Phone: [phone]  Fax: [phone]    11/05/2019, 7:04 PM  This note was partially dictated with voice recognition software. Similar sounding words can be transcribed inadequately or may not  be corrected upon review.

## 2019-11-05 NOTE — Patient Instructions (Signed)

## 2019-11-18 ENCOUNTER — Other Ambulatory Visit: Payer: Self-pay | Admitting: "Endocrinology

## 2019-11-20 ENCOUNTER — Other Ambulatory Visit: Payer: Self-pay | Admitting: "Endocrinology

## 2020-01-01 ENCOUNTER — Other Ambulatory Visit: Payer: Self-pay | Admitting: "Endocrinology

## 2020-02-04 ENCOUNTER — Other Ambulatory Visit: Payer: Self-pay | Admitting: "Endocrinology

## 2020-02-10 ENCOUNTER — Other Ambulatory Visit: Payer: Self-pay | Admitting: "Endocrinology

## 2020-03-02 ENCOUNTER — Ambulatory Visit: Payer: Medicare Other | Admitting: Nurse Practitioner

## 2020-03-09 ENCOUNTER — Other Ambulatory Visit: Payer: Self-pay

## 2020-03-09 ENCOUNTER — Ambulatory Visit (INDEPENDENT_AMBULATORY_CARE_PROVIDER_SITE_OTHER): Payer: Medicare Other | Admitting: Nurse Practitioner

## 2020-03-09 ENCOUNTER — Encounter: Payer: Self-pay | Admitting: Nurse Practitioner

## 2020-03-09 ENCOUNTER — Ambulatory Visit: Payer: Medicare Other | Admitting: "Endocrinology

## 2020-03-09 VITALS — BP 174/76 | HR 96 | Temp 98.8°F | Resp 18 | Ht 69.0 in | Wt 214.0 lb

## 2020-03-09 DIAGNOSIS — E1165 Type 2 diabetes mellitus with hyperglycemia: Secondary | ICD-10-CM | POA: Diagnosis not present

## 2020-03-09 DIAGNOSIS — Z23 Encounter for immunization: Secondary | ICD-10-CM

## 2020-03-09 DIAGNOSIS — K589 Irritable bowel syndrome without diarrhea: Secondary | ICD-10-CM | POA: Diagnosis not present

## 2020-03-09 DIAGNOSIS — I10 Essential (primary) hypertension: Secondary | ICD-10-CM | POA: Diagnosis not present

## 2020-03-09 DIAGNOSIS — Z7689 Persons encountering health services in other specified circumstances: Secondary | ICD-10-CM

## 2020-03-09 DIAGNOSIS — F339 Major depressive disorder, recurrent, unspecified: Secondary | ICD-10-CM

## 2020-03-09 DIAGNOSIS — E782 Mixed hyperlipidemia: Secondary | ICD-10-CM

## 2020-03-09 DIAGNOSIS — E785 Hyperlipidemia, unspecified: Secondary | ICD-10-CM

## 2020-03-09 DIAGNOSIS — F411 Generalized anxiety disorder: Secondary | ICD-10-CM

## 2020-03-09 MED ORDER — SIMVASTATIN 40 MG PO TABS
40.0000 mg | ORAL_TABLET | Freq: Every day | ORAL | 1 refills | Status: DC
Start: 2020-03-09 — End: 2020-09-30

## 2020-03-09 NOTE — Progress Notes (Signed)
New Patient Office Visit  Subjective:  Patient ID: Melissa Tucker, female    DOB: [birth year]  Age: 69 y.o. MRN: [id number]  CC:  Chief Complaint  Patient presents with  . New Patient (Initial Visit)  . Knee Pain    HPI [name] presents for new patient visit. Transferring care from Dr. Juel Burrow. Last labs were done about 3 months ago.  Had right knee surgery recently with Dr. Doreatha Martin in [place]. Needs refill on simvastatin, and will need hydrocodone and xanax around 03/18/20  Past Medical History:  Diagnosis Date  . Anxiety and depression   . Chest tightness   . DEEP VENOUS THROMBOPHLEBITIS 10/05/2009   Qualifier: History of  By: Lattie Haw, MD, Claud Kelp   . Diabetes mellitus 1994   No insulin  . DJD (degenerative joint disease)   . History of DVT (deep vein thrombosis) 1989   Following childbirth  . Hyperlipidemia   . Hypertension   . IBS (irritable bowel syndrome)     Past Surgical History:  Procedure Laterality Date  . ABDOMINAL HYSTERECTOMY    . CESAREAN SECTION  1988  . CHOLECYSTECTOMY    . COLONOSCOPY  2005   Normal findings  . JOINT REPLACEMENT N/A    Phreesia 02/28/2020  . ORIF ANKLE FRACTURE Right 05/16/2019   Procedure: OPEN REDUCTION INTERNAL FIXATION (ORIF) ANKLE FRACTURE;  Surgeon: Shona Needles, MD;  Location: [place];  Service: Orthopedics;  Laterality: Right;  . ORIF FEMUR FRACTURE Right 05/16/2019   Procedure: OPEN REDUCTION INTERNAL FIXATION (ORIF) DISTAL FEMUR FRACTURE;  Surgeon: Shona Needles, MD;  Location: [place];  Service: Orthopedics;  Laterality: Right;  . SHOULDER SURGERY     Left shoulder manipulation with subcrominal decompression, capsulitis  . TOTAL KNEE ARTHROPLASTY  2003   Left  . TOTAL KNEE ARTHROPLASTY  2007   Right    Family History  Problem Relation Age of Onset  . Heart disease Mother   . Hypertension Mother   . Cervical cancer Mother   . Esophageal cancer Father   . Hypertension Sister     Social History    Socioeconomic History  . Marital status: Divorced    Spouse name: Not on file  . Number of children: Not on file  . Years of education: Not on file  . Highest education level: Not on file  Occupational History  . Occupation: disabled    Fish farm manager: UNEMPLOYED  Tobacco Use  . Smoking status: Former Smoker    Quit date: 04/10/1988    Years since quitting: 31.9  . Smokeless tobacco: Former Systems developer  . Tobacco comment: Minimal prior use  Vaping Use  . Vaping Use: Never used  Substance and Sexual Activity  . Alcohol use: No  . Drug use: No  . Sexual activity: Yes  Other Topics Concern  . Not on file  Social History Narrative   Divorced   Resides with daughter, also has 2 sons   No regular exercise   Social Determinants of Health   Financial Resource Strain:   . Difficulty of Paying Living Expenses: Not on file  Food Insecurity:   . Worried About Charity fundraiser in the Last Year: Not on file  . Ran Out of Food in the Last Year: Not on file  Transportation Needs:   . Lack of Transportation (Medical): Not on file  . Lack of Transportation (Non-Medical): Not on file  Physical Activity:   . Days of Exercise per Week:  Not on file  . Minutes of Exercise per Session: Not on file  Stress:   . Feeling of Stress : Not on file  Social Connections:   . Frequency of Communication with Friends and Family: Not on file  . Frequency of Social Gatherings with Friends and Family: Not on file  . Attends Religious Services: Not on file  . Active Member of Clubs or Organizations: Not on file  . Attends Archivist Meetings: Not on file  . Marital Status: Not on file  Intimate Partner Violence:   . Fear of Current or Ex-Partner: Not on file  . Emotionally Abused: Not on file  . Physically Abused: Not on file  . Sexually Abused: Not on file    ROS Review of Systems  Objective:   Today's Vitals: BP (!) 174/76   Pulse 96   Temp 98.8 F (37.1 C)   Resp 18   Ht '5\' 9"'  (1.753  m)   Wt 214 lb (97.1 kg)   SpO2 98%   BMI 31.60 kg/m   Physical Exam  Assessment & Plan:   Problem List Items Addressed This Visit      Cardiovascular and Mediastinum   Essential hypertension, benign    -BP elevated today at 174/76 -she is taking lisinopril 40 mg daily -she states she is nervous and may have some white coat syndrome -will recheck her BP in a week        Digestive   IRRITABLE BOWEL SYNDROME    -hasn't taken bentyl recently -she has been taking colesevelam and that has helped her IBS -takes reglan PRN        Endocrine   Uncontrolled type 2 diabetes mellitus with hyperglycemia (HCC)    -taking trulicity 1.5 mg subcutaneously weekly -taking glipizide XL 10 mg daily -taking metformin 1000 mg BID -on ACE-I and statin -no A1c to review today, will draw today -managed by Dr. Dorris Fetch        Other   Mixed hyperlipidemia    -takes colesevelam and simvastatin -simvastatin refilled today; take 40 mg qhs -no labs to review today, ordered       Anxiety state    -no issues today -takes lexapro 10 mg daily -take xanax 0.5 mg BID PRN      Depression, recurrent (HCC)    -takes lexapro -no issues today       Other Visit Diagnoses    Hyperlipidemia, unspecified hyperlipidemia type    -  Primary      Outpatient Encounter Medications as of 03/09/2020  Medication Sig  . ALPRAZolam (XANAX) 0.5 MG tablet Take 0.5 mg by mouth 2 (two) times daily.   Marland Kitchen BIOTIN PO Take 1 tablet by mouth daily.  . bisacodyl (DULCOLAX) 5 MG EC tablet Take 1 tablet (5 mg total) by mouth daily as needed for moderate constipation.  . Blood Glucose Monitoring Suppl (ACCU-CHEK GUIDE) w/Device KIT 1 Piece by Does not apply route as directed.  . colesevelam (WELCHOL) 625 MG tablet Take 1,875 mg by mouth 2 (two) times daily as needed (irritable bowel syndrome).   Marland Kitchen dicyclomine (BENTYL) 10 MG capsule Take 10 mg by mouth daily.    Marland Kitchen escitalopram (LEXAPRO) 10 MG tablet Take 10 mg by mouth  daily.  Marland Kitchen glipiZIDE (GLUCOTROL XL) 10 MG 24 hr tablet TAKE 1 TABLET BY MOUTH ONCE A DAY.  Marland Kitchen glucose blood (ACCU-CHEK GUIDE) test strip TESTING ONE TIMES DAILY.  Marland Kitchen INDOMETHACIN PO Take by mouth.  Marland Kitchen lisinopril (  PRINIVIL,ZESTRIL) 40 MG tablet Take 10 mg by mouth daily.   . metFORMIN (GLUCOPHAGE) 1000 MG tablet TAKE ONE TABLET BY MOUTH TWICE A DAY WITH A MEAL  . metoCLOPramide (REGLAN) 5 MG tablet Take 5 mg by mouth 4 (four) times daily as needed for nausea or vomiting.   . Multiple Vitamin (MULTIVITAMIN) tablet Take 1 tablet by mouth daily.  . simvastatin (ZOCOR) 40 MG tablet Take 40 mg by mouth at bedtime.    . TRULICITY 1.5 KL/5.0VD SOPN INJECT 1 PEN SUBCUTANEOUSLY ONCE WEEKLY.  . vitamin B-12 (CYANOCOBALAMIN) 1000 MCG tablet Take 1,000 mcg by mouth daily.  Marland Kitchen VITAMIN D PO Take 1 tablet by mouth daily.   No facility-administered encounter medications on file as of 03/09/2020.    Follow-up: Return in about 1 week (around 03/16/2020) for Lab follow-up.   Noreene Larsson, NP

## 2020-03-09 NOTE — Patient Instructions (Addendum)
Influenza (Flu) Vaccine (Inactivated or Recombinant): What You Need to Know 1. Why get vaccinated? Influenza vaccine can prevent influenza (flu). Flu is a contagious disease that spreads around the [place] every year, usually between October and May. Anyone can get the flu, but it is more dangerous for some people. Infants and young children, people 68 years of age and older, pregnant women, and people with certain health conditions or a weakened immune system are at greatest risk of flu complications. Pneumonia, bronchitis, sinus infections and ear infections are examples of flu-related complications. If you have a medical condition, such as heart disease, cancer or diabetes, flu can make it worse. Flu can cause fever and chills, sore throat, muscle aches, fatigue, cough, headache, and runny or stuffy nose. Some people may have vomiting and diarrhea, though this is more common in children than adults. Each year thousands of people in the [place] die from flu, and many more are hospitalized. Flu vaccine prevents millions of illnesses and flu-related visits to the doctor each year. 2. Influenza vaccine CDC recommends everyone 57 months of age and older get vaccinated every flu season. Children 6 months through 2 years of age may need 2 doses during a single flu season. Everyone else needs only 1 dose each flu season. It takes about 2 weeks for protection to develop after vaccination. There are many flu viruses, and they are always changing. Each year a new flu vaccine is made to protect against three or four viruses that are likely to cause disease in the upcoming flu season. Even when the vaccine doesn't exactly match these viruses, it may still provide some protection. Influenza vaccine does not cause flu. Influenza vaccine may be given at the same time as other vaccines. 3. Talk with your health care provider Tell your vaccine provider if the person getting the vaccine:  Has had an  allergic reaction after a previous dose of influenza vaccine, or has any severe, life-threatening allergies.  Has ever had Guillain-Barr Syndrome (also called GBS). In some cases, your health care provider may decide to postpone influenza vaccination to a future visit. People with minor illnesses, such as a cold, may be vaccinated. People who are moderately or severely ill should usually wait until they recover before getting influenza vaccine. Your health care provider can give you more information. 4. Risks of a vaccine reaction  Soreness, redness, and swelling where shot is given, fever, muscle aches, and headache can happen after influenza vaccine.  There may be a very small increased risk of Guillain-Barr Syndrome (GBS) after inactivated influenza vaccine (the flu shot). Young children who get the flu shot along with pneumococcal vaccine (PCV13), and/or DTaP vaccine at the same time might be slightly more likely to have a seizure caused by fever. Tell your health care provider if a child who is getting flu vaccine has ever had a seizure. People sometimes faint after medical procedures, including vaccination. Tell your provider if you feel dizzy or have vision changes or ringing in the ears. As with any medicine, there is a very remote chance of a vaccine causing a severe allergic reaction, other serious injury, or death. 5. What if there is a serious problem? An allergic reaction could occur after the vaccinated person leaves the clinic. If you see signs of a severe allergic reaction (hives, swelling of the face and throat, difficulty breathing, a fast heartbeat, dizziness, or weakness), call 9-1-1 and get the person to the nearest hospital. For other signs that  concern you, call your health care provider. Adverse reactions should be reported to the Vaccine Adverse Event Reporting System (VAERS). Your health care provider will usually file this report, or you can do it yourself. Visit the  VAERS website at www.vaers.LAgents.no or call 330-510-6533.VAERS is only for reporting reactions, and VAERS staff do not give medical advice. 6. The National Vaccine Injury Compensation Program The [employer] Vaccine Injury Compensation Program (VICP) is a federal program that was created to compensate people who may have been injured by certain vaccines. Visit the VICP website at SpiritualWord.at or call 709-539-3525 to learn about the program and about filing a claim. There is a time limit to file a claim for compensation. 7. How can I learn more?  Ask your healthcare provider.  Call your local or state health department.  Contact the Centers for Disease Control and Prevention (CDC): ? Call 941-659-8335 (1-800-CDC-INFO) or ? Visit CDC's BiotechRoom.com.cy Vaccine Information Statement (Interim) Inactivated Influenza Vaccine (11/22/2017) This information is not intended to replace advice given to you by your health care provider. Make sure you discuss any questions you have with your health care provider. Document Revised: 07/16/2018 Document Reviewed: 11/26/2017 Elsevier Patient Education  2020 Elsevier Inc.  Pneumococcal Conjugate Vaccine suspension for injection What is this medicine? PNEUMOCOCCAL VACCINE (NEU mo KOK al vak SEEN) is a vaccine used to prevent pneumococcus bacterial infections. These bacteria can cause serious infections like pneumonia, meningitis, and blood infections. This vaccine will lower your chance of getting pneumonia. If you do get pneumonia, it can make your symptoms milder and your illness shorter. This vaccine will not treat an infection and will not cause infection. This vaccine is recommended for infants and young children, adults with certain medical conditions, and adults 65 years or older. This medicine may be used for other purposes; ask your health care provider or pharmacist if you have questions. COMMON BRAND NAME(S): Prevnar, Prevnar  13 What should I tell my health care provider before I take this medicine? They need to know if you have any of these conditions:  bleeding problems  fever  immune system problems  an unusual or allergic reaction to pneumococcal vaccine, diphtheria toxoid, other vaccines, latex, other medicines, foods, dyes, or preservatives  pregnant or trying to get pregnant  breast-feeding How should I use this medicine? This vaccine is for injection into a muscle. It is given by a health care professional. A copy of Vaccine Information Statements will be given before each vaccination. Read this sheet carefully each time. The sheet may change frequently. Talk to your pediatrician regarding the use of this medicine in children. While this drug may be prescribed for children as young as 54 weeks old for selected conditions, precautions do apply. Overdosage: If you think you have taken too much of this medicine contact a poison control center or emergency room at once. NOTE: This medicine is only for you. Do not share this medicine with others. What if I miss a dose? It is important not to miss your dose. Call your doctor or health care professional if you are unable to keep an appointment. What may interact with this medicine?  medicines for cancer chemotherapy  medicines that suppress your immune function  steroid medicines like prednisone or cortisone This list may not describe all possible interactions. Give your health care provider a list of all the medicines, herbs, non-prescription drugs, or dietary supplements you use. Also tell them if you smoke, drink alcohol, or use illegal drugs. Some items  may interact with your medicine. What should I watch for while using this medicine? Mild fever and pain should go away in 3 days or less. Report any unusual symptoms to your doctor or health care professional. What side effects may I notice from receiving this medicine? Side effects that you should  report to your doctor or health care professional as soon as possible:  allergic reactions like skin rash, itching or hives, swelling of the face, lips, or tongue  breathing problems  confused  fast or irregular heartbeat  fever over 102 degrees F  seizures  unusual bleeding or bruising  unusual muscle weakness Side effects that usually do not require medical attention (report to your doctor or health care professional if they continue or are bothersome):  aches and pains  diarrhea  fever of 102 degrees F or less  headache  irritable  loss of appetite  pain, tender at site where injected  trouble sleeping This list may not describe all possible side effects. Call your doctor for medical advice about side effects. You may report side effects to FDA at 1-800-FDA-1088. Where should I keep my medicine? This does not apply. This vaccine is given in a clinic, pharmacy, doctor's office, or other health care setting and will not be stored at home. NOTE: This sheet is a summary. It may not cover all possible information. If you have questions about this medicine, talk to your doctor, pharmacist, or health care provider.  2020 Elsevier/Gold Standard (2014-01-01 10:27:27)

## 2020-03-09 NOTE — Assessment & Plan Note (Signed)
-  takes lexapro -no issues today

## 2020-03-09 NOTE — Assessment & Plan Note (Addendum)
-  taking trulicity 1.5 mg subcutaneously weekly -taking glipizide XL 10 mg daily -taking metformin 1000 mg BID -on ACE-I and statin -no A1c to review today, will draw today -managed by Dr. Fransico Him

## 2020-03-09 NOTE — Assessment & Plan Note (Addendum)
-  hasn't taken bentyl recently -she has been taking colesevelam and that has helped her IBS -takes reglan PRN

## 2020-03-09 NOTE — Assessment & Plan Note (Signed)
-  no issues today -takes lexapro 10 mg daily -take xanax 0.5 mg BID PRN

## 2020-03-09 NOTE — Assessment & Plan Note (Signed)
-  takes colesevelam and simvastatin -simvastatin refilled today; take 40 mg qhs -no labs to review today, ordered

## 2020-03-09 NOTE — Assessment & Plan Note (Signed)
-  BP elevated today at 174/76 -she is taking lisinopril 40 mg daily -she states she is nervous and may have some white coat syndrome -will recheck her BP in a week

## 2020-03-10 ENCOUNTER — Other Ambulatory Visit: Payer: Self-pay | Admitting: "Endocrinology

## 2020-03-10 LAB — CBC WITH DIFFERENTIAL/PLATELET
Basophils Absolute: 0 10*3/uL (ref 0.0–0.2)
Basos: 0 %
EOS (ABSOLUTE): 0.1 10*3/uL (ref 0.0–0.4)
Eos: 1 %
Hematocrit: 36.7 % (ref 34.0–46.6)
Hemoglobin: 12 g/dL (ref 11.1–15.9)
Immature Grans (Abs): 0 10*3/uL (ref 0.0–0.1)
Immature Granulocytes: 0 %
Lymphocytes Absolute: 2.6 10*3/uL (ref 0.7–3.1)
Lymphs: 36 %
MCH: 29 pg (ref 26.6–33.0)
MCHC: 32.7 g/dL (ref 31.5–35.7)
MCV: 89 fL (ref 79–97)
Monocytes Absolute: 0.3 10*3/uL (ref 0.1–0.9)
Monocytes: 5 %
Neutrophils Absolute: 4.1 10*3/uL (ref 1.4–7.0)
Neutrophils: 58 %
Platelets: 365 10*3/uL (ref 150–450)
RBC: 4.14 x10E6/uL (ref 3.77–5.28)
RDW: 10.9 % — ABNORMAL LOW (ref 11.7–15.4)
WBC: 7.1 10*3/uL (ref 3.4–10.8)

## 2020-03-10 LAB — LIPID PANEL WITH LDL/HDL RATIO
Cholesterol, Total: 204 mg/dL — ABNORMAL HIGH (ref 100–199)
HDL: 59 mg/dL (ref >39)
LDL Chol Calc (NIH): 113 mg/dL — ABNORMAL HIGH (ref 0–99)
LDL/HDL Ratio: 1.9 ratio (ref 0.0–3.2)
Triglycerides: 183 mg/dL — ABNORMAL HIGH (ref 0–149)
VLDL Cholesterol Cal: 32 mg/dL (ref 5–40)

## 2020-03-10 LAB — CMP14+EGFR
ALT: 32 IU/L (ref 0–32)
AST: 23 IU/L (ref 0–40)
Albumin/Globulin Ratio: 2 (ref 1.2–2.2)
Albumin: 4.7 g/dL (ref 3.8–4.8)
Alkaline Phosphatase: 131 IU/L — ABNORMAL HIGH (ref 44–121)
BUN/Creatinine Ratio: 22 (ref 12–28)
BUN: 11 mg/dL (ref 8–27)
Bilirubin Total: 0.4 mg/dL (ref 0.0–1.2)
CO2: 26 mmol/L (ref 20–29)
Calcium: 10.1 mg/dL (ref 8.7–10.3)
Chloride: 98 mmol/L (ref 96–106)
Creatinine, Ser: 0.51 mg/dL — ABNORMAL LOW (ref 0.57–1.00)
GFR calc Af Amer: 114 mL/min/{1.73_m2} (ref >59)
GFR calc non Af Amer: 99 mL/min/{1.73_m2} (ref >59)
Globulin, Total: 2.3 g/dL (ref 1.5–4.5)
Glucose: 157 mg/dL — ABNORMAL HIGH (ref 65–99)
Potassium: 4.3 mmol/L (ref 3.5–5.2)
Sodium: 139 mmol/L (ref 134–144)
Total Protein: 7 g/dL (ref 6.0–8.5)

## 2020-03-10 LAB — HEMOGLOBIN A1C
Est. average glucose Bld gHb Est-mCnc: 214 mg/dL
Hgb A1c MFr Bld: 9.1 % — ABNORMAL HIGH (ref 4.8–5.6)

## 2020-03-10 LAB — HCV INTERPRETATION

## 2020-03-10 LAB — HCV AB W/RFLX TO VERIFICATION: HCV Ab: <0.1 s/co ratio (ref 0.0–0.9)

## 2020-03-16 ENCOUNTER — Encounter: Payer: Self-pay | Admitting: Nurse Practitioner

## 2020-03-16 ENCOUNTER — Telehealth (INDEPENDENT_AMBULATORY_CARE_PROVIDER_SITE_OTHER): Payer: Medicare Other | Admitting: Nurse Practitioner

## 2020-03-16 ENCOUNTER — Ambulatory Visit: Payer: Medicare Other | Admitting: Nurse Practitioner

## 2020-03-16 ENCOUNTER — Other Ambulatory Visit: Payer: Self-pay

## 2020-03-16 DIAGNOSIS — K580 Irritable bowel syndrome with diarrhea: Secondary | ICD-10-CM

## 2020-03-16 MED ORDER — ALPRAZOLAM 0.5 MG PO TABS
0.5000 mg | ORAL_TABLET | Freq: Two times a day (BID) | ORAL | 0 refills | Status: DC | PRN
Start: 2020-03-22 — End: 2020-04-19

## 2020-03-16 MED ORDER — HYDROCODONE-ACETAMINOPHEN 10-325 MG PO TABS
1.0000 | ORAL_TABLET | Freq: Four times a day (QID) | ORAL | 0 refills | Status: DC | PRN
Start: 2020-03-16 — End: 2020-05-11

## 2020-03-16 NOTE — Addendum Note (Signed)
Addended by: Bjorn Pippin on: 03/16/2020 10:06 AM   Modules accepted: Orders

## 2020-03-16 NOTE — Assessment & Plan Note (Signed)
-  she took her bentyl once today -we discussed increasing her bentyl, and she can take 2 more doses today -we discussed taking imodium, 2 capsules now and 1 capsule each time she has diarrhea, up to 4 capsules today -if she still has diarrhea, she can repeat this tomorrow -if symptoms persist beyond 2 days, she will need to come into office -at that point could consider hyoscyamine or xifaxan (she is [employer], so this may not be affordable) or GI referral

## 2020-03-16 NOTE — Progress Notes (Signed)
Acute Office Visit  Subjective:    Patient ID: Melissa Tucker, female    DOB: 1950/11/03, 69 y.o.   MRN: [id number]  Chief Complaint  Patient presents with  . Irritable Bowel Syndrome    diarrhea     HPI Patient is in today for diarrhea.  She has diarrhea flares occasionally, and this started this morning.  She took her dicyclomine today.  She has not taken imodium today.  Denies abdominal cramps or abdominal pain.  Past Medical History:  Diagnosis Date  . Anxiety and depression   . Chest tightness   . DEEP VENOUS THROMBOPHLEBITIS 10/05/2009   Qualifier: History of  By: Lattie Haw, MD, Claud Kelp   . Diabetes mellitus 1994   No insulin  . DJD (degenerative joint disease)   . History of DVT (deep vein thrombosis) 1989   Following childbirth  . Hyperlipidemia   . Hypertension   . IBS (irritable bowel syndrome)     Past Surgical History:  Procedure Laterality Date  . ABDOMINAL HYSTERECTOMY    . CESAREAN SECTION  1988  . CHOLECYSTECTOMY    . COLONOSCOPY  2005   Normal findings  . JOINT REPLACEMENT N/A    [employer]reesia 02/28/2020  . ORIF ANKLE FRACTURE Right 05/16/2019   Procedure: OPEN REDUCTION INTERNAL FIXATION (ORIF) ANKLE FRACTURE;  Surgeon: Shona Needles, MD;  Location: [place];  Service: Orthopedics;  Laterality: Right;  . ORIF FEMUR FRACTURE Right 05/16/2019   Procedure: OPEN REDUCTION INTERNAL FIXATION (ORIF) DISTAL FEMUR FRACTURE;  Surgeon: Shona Needles, MD;  Location: [place];  Service: Orthopedics;  Laterality: Right;  . SHOULDER SURGERY     Left shoulder manipulation with subcrominal decompression, capsulitis  . TOTAL KNEE ARTHROPLASTY  2003   Left  . TOTAL KNEE ARTHROPLASTY  2007   Right    Family History  Problem Relation Age of Onset  . Heart disease Mother   . Hypertension Mother   . Cervical cancer Mother   . Esophageal cancer Father   . Hypertension Sister     Social History   Socioeconomic History  . Marital status: Divorced    Spouse name:  Not on file  . Number of children: Not on file  . Years of education: Not on file  . Highest education level: Not on file  Occupational History  . Occupation: disabled    Fish farm manager: UNEMPLOYED  Tobacco Use  . Smoking status: Former Smoker    Quit date: 04/10/1988    Years since quitting: 31.9  . Smokeless tobacco: Former Systems developer  . Tobacco comment: Minimal prior use  Vaping Use  . Vaping Use: Never used  Substance and Sexual Activity  . Alcohol use: No  . Drug use: No  . Sexual activity: Yes  Other Topics Concern  . Not on file  Social History Narrative   Divorced   Resides with daughter, also has 2 sons   No regular exercise   Social Determinants of Health   Financial Resource Strain:   . Difficulty of Paying Living Expenses: Not on file  Food Insecurity:   . Worried About Charity fundraiser in the Last Year: Not on file  . Ran Out of Food in the Last Year: Not on file  Transportation Needs:   . Lack of Transportation (Medical): Not on file  . Lack of Transportation (Non-Medical): Not on file  Physical Activity:   . Days of Exercise per Week: Not on file  . Minutes of Exercise  per Session: Not on file  Stress:   . Feeling of Stress : Not on file  Social Connections:   . Frequency of Communication with Friends and Family: Not on file  . Frequency of Social Gatherings with Friends and Family: Not on file  . Attends Religious Services: Not on file  . Active Member of Clubs or Organizations: Not on file  . Attends Archivist Meetings: Not on file  . Marital Status: Not on file  Intimate Partner Violence:   . Fear of Current or Ex-Partner: Not on file  . Emotionally Abused: Not on file  . Physically Abused: Not on file  . Sexually Abused: Not on file    Outpatient Medications Prior to Visit  Medication Sig Dispense Refill  . ALPRAZolam (XANAX) 0.5 MG tablet Take 0.5 mg by mouth 2 (two) times daily.     Marland Kitchen BIOTIN PO Take 1 tablet by mouth daily.    .  bisacodyl (DULCOLAX) 5 MG EC tablet Take 1 tablet (5 mg total) by mouth daily as needed for moderate constipation. 30 tablet 0  . Blood Glucose Monitoring Suppl (ACCU-CHEK GUIDE) w/Device KIT 1 Piece by Does not apply route as directed. 1 kit 0  . colesevelam (WELCHOL) 625 MG tablet Take 1,875 mg by mouth 2 (two) times daily as needed (irritable bowel syndrome).     Marland Kitchen dicyclomine (BENTYL) 10 MG capsule Take 10 mg by mouth daily.      Marland Kitchen escitalopram (LEXAPRO) 10 MG tablet Take 10 mg by mouth daily.    Marland Kitchen glipiZIDE (GLUCOTROL XL) 10 MG 24 hr tablet TAKE 1 TABLET BY MOUTH ONCE A DAY. 90 tablet 0  . glucose blood (ACCU-CHEK GUIDE) test strip TESTING ONE TIMES DAILY. 100 strip 1  . INDOMETHACIN PO Take by mouth.    Marland Kitchen lisinopril (PRINIVIL,ZESTRIL) 40 MG tablet Take 10 mg by mouth daily.     . metFORMIN (GLUCOPHAGE) 1000 MG tablet TAKE ONE TABLET BY MOUTH TWICE A DAY WITH A MEAL 180 tablet 0  . metoCLOPramide (REGLAN) 5 MG tablet Take 5 mg by mouth 4 (four) times daily as needed for nausea or vomiting.     . Multiple Vitamin (MULTIVITAMIN) tablet Take 1 tablet by mouth daily.    . simvastatin (ZOCOR) 40 MG tablet Take 1 tablet (40 mg total) by mouth at bedtime. 90 tablet 1  . TRULICITY 1.5 IP/3.8SN SOPN INJECT 1 PEN SUBCUTANEOUSLY ONCE WEEKLY. 2 mL 0  . vitamin B-12 (CYANOCOBALAMIN) 1000 MCG tablet Take 1,000 mcg by mouth daily.    Marland Kitchen VITAMIN D PO Take 1 tablet by mouth daily.     No facility-administered medications prior to visit.    Allergies  Allergen Reactions  . Peanut-Containing Drug Products Swelling    Review of Systems  Constitutional: Negative.   Respiratory: Negative.   Cardiovascular: Negative.   Gastrointestinal: Positive for diarrhea. Negative for abdominal distention, abdominal pain, blood in stool and rectal pain.       Objective:    Physical Exam  There were no vitals taken for this visit. Wt Readings from Last 3 Encounters:  03/09/20 214 lb (97.1 kg)  11/05/19 (!)  207 lb 6.4 oz (94.1 kg)  05/16/19 199 lb (90.3 kg)    Health Maintenance Due  Topic Date Due  . FOOT EXAM  Never done  . OPHTHALMOLOGY EXAM  Never done  . COVID-19 Vaccine (1) Never done  . TETANUS/TDAP  Never done  . COLONOSCOPY  Never done  .  DEXA SCAN  Never done    There are no preventive care reminders to display for this patient.   Lab Results  Component Value Date   TSH 0.58 01/11/2018   Lab Results  Component Value Date   WBC 7.1 03/09/2020   HGB 12.0 03/09/2020   HCT 36.7 03/09/2020   MCV 89 03/09/2020   PLT 365 03/09/2020   Lab Results  Component Value Date   NA 139 03/09/2020   K 4.3 03/09/2020   CO2 26 03/09/2020   GLUCOSE 157 (H) 03/09/2020   BUN 11 03/09/2020   CREATININE 0.51 (L) 03/09/2020   BILITOT 0.4 03/09/2020   ALKPHOS 131 (H) 03/09/2020   AST 23 03/09/2020   ALT 32 03/09/2020   PROT 7.0 03/09/2020   ALBUMIN 4.7 03/09/2020   CALCIUM 10.1 03/09/2020   ANIONGAP 10 05/15/2019   Lab Results  Component Value Date   CHOL 204 (H) 03/09/2020   Lab Results  Component Value Date   HDL 59 03/09/2020   Lab Results  Component Value Date   LDLCALC 113 (H) 03/09/2020   Lab Results  Component Value Date   TRIG 183 (H) 03/09/2020   No results found for: CHOLHDL Lab Results  Component Value Date   HGBA1C 9.1 (H) 03/09/2020       Assessment & Plan:   Problem List Items Addressed This Visit      Digestive   IRRITABLE BOWEL SYNDROME - Primary    -she took her bentyl once today -we discussed increasing her bentyl, and she can take 2 more doses today -we discussed taking imodium, 2 capsules now and 1 capsule each time she has diarrhea, up to 4 capsules today -if she still has diarrhea, she can repeat this tomorrow -if symptoms persist beyond 2 days, she will need to come into office -at that point could consider hyoscyamine or xifaxan (she is [employer], so this may not be affordable) or GI referral           No  orders of the defined types were placed in this encounter.  Date:  03/16/2020   Location of Patient: Home Location of Provider: Office Consent was obtain for visit to be over via telehealth. I verified that I am speaking with the correct person using two identifiers.  I connected with  Ulyses Jarred on 03/16/20 via telephone and verified that I am speaking with the correct person using two identifiers.   I discussed the limitations of evaluation and management by telemedicine. The patient expressed understanding and agreed to proceed.  Time spent 15 min  Noreene Larsson, NP

## 2020-03-23 ENCOUNTER — Ambulatory Visit: Payer: Medicare Other | Admitting: Nurse Practitioner

## 2020-03-30 ENCOUNTER — Other Ambulatory Visit: Payer: Self-pay | Admitting: "Endocrinology

## 2020-04-05 ENCOUNTER — Other Ambulatory Visit: Payer: Self-pay | Admitting: "Endocrinology

## 2020-04-06 ENCOUNTER — Ambulatory Visit: Payer: Medicare Other | Admitting: Nurse Practitioner

## 2020-04-06 ENCOUNTER — Other Ambulatory Visit (HOSPITAL_COMMUNITY): Payer: Self-pay | Admitting: Internal Medicine

## 2020-04-06 DIAGNOSIS — Z1231 Encounter for screening mammogram for malignant neoplasm of breast: Secondary | ICD-10-CM

## 2020-04-14 ENCOUNTER — Ambulatory Visit: Payer: Medicare Other | Admitting: Nurse Practitioner

## 2020-04-14 ENCOUNTER — Ambulatory Visit: Payer: Medicare Other | Admitting: "Endocrinology

## 2020-04-19 ENCOUNTER — Other Ambulatory Visit: Payer: Self-pay | Admitting: Nurse Practitioner

## 2020-04-22 ENCOUNTER — Ambulatory Visit: Payer: Medicare Other | Admitting: "Endocrinology

## 2020-04-28 ENCOUNTER — Ambulatory Visit: Payer: Medicare Other | Admitting: "Endocrinology

## 2020-05-04 ENCOUNTER — Other Ambulatory Visit: Payer: Self-pay | Admitting: "Endocrinology

## 2020-05-05 ENCOUNTER — Other Ambulatory Visit: Payer: Self-pay

## 2020-05-05 ENCOUNTER — Ambulatory Visit (HOSPITAL_COMMUNITY)
Admission: RE | Admit: 2020-05-05 | Discharge: 2020-05-05 | Disposition: A | Discharge status: Home / Self Care | Payer: Medicare Other | Source: Ambulatory Visit | Attending: Internal Medicine | Admitting: Internal Medicine

## 2020-05-05 DIAGNOSIS — Z1231 Encounter for screening mammogram for malignant neoplasm of breast: Secondary | ICD-10-CM | POA: Diagnosis not present

## 2020-05-11 ENCOUNTER — Encounter: Payer: Self-pay | Admitting: "Endocrinology

## 2020-05-11 ENCOUNTER — Other Ambulatory Visit: Payer: Self-pay

## 2020-05-11 ENCOUNTER — Ambulatory Visit (INDEPENDENT_AMBULATORY_CARE_PROVIDER_SITE_OTHER): Payer: Medicare Other | Admitting: "Endocrinology

## 2020-05-11 ENCOUNTER — Other Ambulatory Visit: Payer: Self-pay | Admitting: Nurse Practitioner

## 2020-05-11 VITALS — BP 179/96 | HR 96 | Ht 69.0 in | Wt 216.6 lb

## 2020-05-11 DIAGNOSIS — E1165 Type 2 diabetes mellitus with hyperglycemia: Secondary | ICD-10-CM

## 2020-05-11 DIAGNOSIS — I10 Essential (primary) hypertension: Secondary | ICD-10-CM

## 2020-05-11 DIAGNOSIS — E782 Mixed hyperlipidemia: Secondary | ICD-10-CM | POA: Diagnosis not present

## 2020-05-11 NOTE — Patient Instructions (Signed)

## 2020-05-11 NOTE — Progress Notes (Signed)
05/11/2020, 4:25 PM        Endocrinology follow-up note   Subjective:    Patient ID: Melissa Tucker, female    DOB: 1951/01/01.  Melissa Tucker is seen in follow-up for management of currently uncontrolled symptomatic diabetes, hyperlipidemia, hypertension requested by  Noreene Larsson, NP.   Past Medical History:  Diagnosis Date  . Anxiety and depression   . Chest tightness   . DEEP VENOUS THROMBOPHLEBITIS 10/05/2009   Qualifier: History of  By: Lattie Haw, MD, Claud Kelp   . Diabetes mellitus 1994   No insulin  . DJD (degenerative joint disease)   . History of DVT (deep vein thrombosis) 1989   Following childbirth  . Hyperlipidemia   . Hypertension   . IBS (irritable bowel syndrome)    Past Surgical History:  Procedure Laterality Date  . ABDOMINAL HYSTERECTOMY    . CESAREAN SECTION  1988  . CHOLECYSTECTOMY    . COLONOSCOPY  2005   Normal findings  . JOINT REPLACEMENT N/A    Phreesia 02/28/2020  . ORIF ANKLE FRACTURE Right 05/16/2019   Procedure: OPEN REDUCTION INTERNAL FIXATION (ORIF) ANKLE FRACTURE;  Surgeon: Shona Needles, MD;  Location: [facility];  Service: Orthopedics;  Laterality: Right;  . ORIF FEMUR FRACTURE Right 05/16/2019   Procedure: OPEN REDUCTION INTERNAL FIXATION (ORIF) DISTAL FEMUR FRACTURE;  Surgeon: Shona Needles, MD;  Location: [facility];  Service: Orthopedics;  Laterality: Right;  . SHOULDER SURGERY     Left shoulder manipulation with subcrominal decompression, capsulitis  . TOTAL KNEE ARTHROPLASTY  2003   Left  . TOTAL KNEE ARTHROPLASTY  2007   Right   Social History   Socioeconomic History  . Marital status: Divorced    Spouse name: Not on file  . Number of children: Not on file  . Years of education: Not on file  . Highest education level: Not on file  Occupational History  . Occupation: disabled    Fish farm manager: UNEMPLOYED  Tobacco Use  . Smoking status: Former Smoker    Quit date: 04/10/1988    Years since quitting:  32.1  . Smokeless tobacco: Former Systems developer  . Tobacco comment: Minimal prior use  Vaping Use  . Vaping Use: Never used  Substance and Sexual Activity  . Alcohol use: No  . Drug use: No  . Sexual activity: Yes  Other Topics Concern  . Not on file  Social History Narrative   Divorced   Resides with daughter, also has 2 sons   No regular exercise   Social Determinants of Health   Financial Resource Strain: Not on file  Food Insecurity: Not on file  Transportation Needs: Not on file  Physical Activity: Not on file  Stress: Not on file  Social Connections: Not on file   Outpatient Encounter Medications as of 05/11/2020  Medication Sig  . ALPRAZolam (XANAX) 0.5 MG tablet TAKE 1 TABLET BY MOUTH TWICE DAILY AS NEEDED FOR ANXIETY  . BIOTIN PO Take 1 tablet by mouth daily.  . bisacodyl (DULCOLAX) 5 MG EC tablet Take 1 tablet (5 mg total) by mouth daily as needed for moderate constipation.  . Blood Glucose Monitoring Suppl (ACCU-CHEK GUIDE) w/Device KIT 1 Piece by Does not apply route as directed.  . colesevelam (WELCHOL) 625 MG tablet Take 1,875 mg by mouth 2 (two) times daily as needed (irritable bowel syndrome).   Marland Kitchen dicyclomine (BENTYL) 10 MG capsule Take 10 mg by  mouth daily.    Marland Kitchen escitalopram (LEXAPRO) 10 MG tablet Take 10 mg by mouth daily.  Marland Kitchen glipiZIDE (GLUCOTROL XL) 10 MG 24 hr tablet TAKE 1 TABLET BY MOUTH ONCE A DAY.  Marland Kitchen glucose blood (ACCU-CHEK GUIDE) test strip TESTING ONE TIMES DAILY.  Derrill Memo ON 05/13/2020] HYDROcodone-acetaminophen (NORCO) 10-325 MG tablet TAKE 1 TABLET BY MOUTH FOUR TIMES DAILY AS NEEDED.  . INDOMETHACIN PO Take by mouth.  Marland Kitchen lisinopril (PRINIVIL,ZESTRIL) 40 MG tablet Take 10 mg by mouth daily.   . metFORMIN (GLUCOPHAGE) 1000 MG tablet TAKE ONE TABLET BY MOUTH TWICE A DAY WITH A MEAL  . metoCLOPramide (REGLAN) 5 MG tablet Take 5 mg by mouth 4 (four) times daily as needed for nausea or vomiting.   . Multiple Vitamin (MULTIVITAMIN) tablet Take 1 tablet by mouth  daily.  . simvastatin (ZOCOR) 40 MG tablet Take 1 tablet (40 mg total) by mouth at bedtime.  . TRULICITY 1.5 GU/4.4IH SOPN INJECT 1 PEN SUBCUTANEOUSLY ONCE WEEKLY.  . vitamin B-12 (CYANOCOBALAMIN) 1000 MCG tablet Take 1,000 mcg by mouth daily.  Marland Kitchen VITAMIN D PO Take 1 tablet by mouth daily.   No facility-administered encounter medications on file as of 05/11/2020.    ALLERGIES: Allergies  Allergen Reactions  . Peanut-Containing Drug Products Swelling    VACCINATION STATUS: Immunization History  Administered Date(s) Administered  . Fluad Quad(high Dose 65+) 03/09/2020  . Pneumococcal Conjugate-13 03/09/2020    Diabetes She presents for her follow-up diabetic visit. She has type 2 diabetes mellitus. Onset time: She was diagnosed at approximate age of 79 years. Her disease course has been worsening. There are no hypoglycemic associated symptoms. Pertinent negatives for hypoglycemia include no confusion, headaches, pallor or seizures. Associated symptoms include polydipsia. Pertinent negatives for diabetes include no blurred vision, no chest pain, no fatigue, no polyphagia and no polyuria. There are no hypoglycemic complications. Symptoms are worsening. Diabetic complications include retinopathy. Risk factors for coronary artery disease include diabetes mellitus, dyslipidemia, family history, hypertension, obesity, sedentary lifestyle, post-menopausal and tobacco exposure. Current diabetic treatments: She is currently on Invokana 300 mg p.o. daily metformin/glipizide,\ She is compliant with treatment some of the time. Her weight is increasing steadily. She is following a generally unhealthy diet. When asked about meal planning, she reported none. She has not had a previous visit with a dietitian. She never participates in exercise. Her home blood glucose trend is increasing steadily. Her breakfast blood glucose range is generally >200 mg/dl. Her overall blood glucose range is >200 mg/dl. (She presents  with incomplete records.  Her fasting blood glucose profile significantly above target ranging between 200-300 mg per DL.  She denies hypoglycemia.  Her previsit labs show A1c of 9.1%, increasing from 7.9% during her last visit.  ) An ACE inhibitor/angiotensin II receptor blocker is being taken. Eye exam is current.  Hyperlipidemia This is a chronic problem. The current episode started more than 1 year ago. The problem is controlled. Exacerbating diseases include diabetes and obesity. Pertinent negatives include no chest pain, myalgias or shortness of breath. Current antihyperlipidemic treatment includes statins and ezetimibe. Risk factors for coronary artery disease include dyslipidemia, diabetes mellitus, family history, obesity, hypertension, a sedentary lifestyle and post-menopausal.  Hypertension This is a chronic problem. The current episode started more than 1 year ago. The problem is uncontrolled. Pertinent negatives include no blurred vision, chest pain, headaches, palpitations or shortness of breath. Risk factors for coronary artery disease include diabetes mellitus, dyslipidemia, family history, obesity, post-menopausal state, sedentary lifestyle  and smoking/tobacco exposure. Past treatments include ACE inhibitors. Hypertensive end-organ damage includes retinopathy.    Review of systems Limited as above.  Objective:    BP (!) 179/96   Pulse 96   Ht '5\' 9"'  (1.753 m)   Wt 216 lb 9.6 oz (98.2 kg)   BMI 31.99 kg/m   Wt Readings from Last 3 Encounters:  05/11/20 216 lb 9.6 oz (98.2 kg)  03/09/20 214 lb (97.1 kg)  11/05/19 (!) 207 lb 6.4 oz (94.1 kg)      Physical Exam- Limited  CMP     Component Value Date/Time   NA 139 03/09/2020 1400   K 4.3 03/09/2020 1400   CL 98 03/09/2020 1400   CO2 26 03/09/2020 1400   GLUCOSE 157 (H) 03/09/2020 1400   GLUCOSE 281 (H) 05/15/2019 1505   BUN 11 03/09/2020 1400   CREATININE 0.51 (L) 03/09/2020 1400   CREATININE 0.64 08/14/2018 1142    CALCIUM 10.1 03/09/2020 1400   PROT 7.0 03/09/2020 1400   ALBUMIN 4.7 03/09/2020 1400   AST 23 03/09/2020 1400   ALT 32 03/09/2020 1400   ALKPHOS 131 (H) 03/09/2020 1400   BILITOT 0.4 03/09/2020 1400   GFRNONAA 99 03/09/2020 1400   GFRNONAA 92 08/14/2018 1142   GFRAA 114 03/09/2020 1400   GFRAA 107 08/14/2018 1142     Diabetic Labs (most recent): Lab Results  Component Value Date   HGBA1C 9.1 (H) 03/09/2020   HGBA1C 7.9 08/20/2019   HGBA1C 7.0 (A) 06/09/2019     Lipid Panel ( most recent) Lipid Panel     Component Value Date/Time   CHOL 204 (H) 03/09/2020 1400   TRIG 183 (H) 03/09/2020 1400   HDL 59 03/09/2020 1400   LDLCALC 113 (H) 03/09/2020 1400     Assessment & Plan:   1. Uncontrolled type 2 diabetes mellitus with hyperglycemia ([place])  - Melissa Tucker has currently uncontrolled symptomatic type 2 DM since 70 years of age.  She presents with incomplete records.  Her fasting blood glucose profile significantly above target ranging between 200-300 mg per DL.  She denies hypoglycemia.  Her previsit labs show A1c of 9.1%, increasing from 7.9% during her last visit.     -her diabetes is complicated by retinopathy, obesity/sedentary life, history of smoking and Melissa Tucker remains at a high risk for more acute and chronic complications which include CAD, CVA, CKD, retinopathy, and neuropathy. These are all discussed in detail with the patient.  - I have counseled her on diet management and weight loss, by adopting a carbohydrate restricted/protein rich diet.  - she acknowledges that there is a room for improvement in her food and drink choices. - Suggestion is made for her to avoid simple carbohydrates  from her diet including Cakes, Sweet Desserts, Ice Cream, Soda (diet and regular), Sweet Tea, Candies, Chips, Cookies, Store Bought Juices, Alcohol in Excess of  1-2 drinks a day, Artificial Sweeteners,  Coffee Creamer, and "Sugar-free" Products, Lemonade. This will help  patient to have more stable blood glucose profile and potentially avoid unintended weight gain.   - I encouraged her to switch to  unprocessed or minimally processed complex starch and increased protein intake (animal or plant source), fruits, and vegetables.  - she is advised to stick to a routine mealtimes to eat 3 meals  a day and avoid unnecessary snacks ( to snack only to correct hypoglycemia).   - I have approached her with the following individualized plan to manage  diabetes and patient agrees:   -In light of her presentation with A1c of 9.1%, she will need insulin treatment.  However, patient is hesitant to go on insulin at this point.   -She promises to do better on her dietary management.  She is willing to start monitoring blood glucose at least twice a day-daily before breakfast and at bedtime and return in 4 weeks for reevaluation.  -In the meantime, she is advised to continue Trulicity 1.5 mg subcutaneously weekly, Metformin 1000 mg p.o. twice daily, and glipizide 10 mg p.o. daily at breakfast.  -She is encouraged to call clinic for blood glucose readings less than 70 or greater than 200x3.      2) BP/HTN:  Her blood pressure is not controlled to target.    She is advised to continue her current medications including lisinopril 40 mg p.o. daily, Lasix 40 mg p.o. daily . She will be considered for additional treatment with hydrochlorothiazide on her next visit.   3) Lipids/HPL: Her recent lipid panel showed controlled LDL at 65.  She is advised to continue simvastatin 40 mg p.o. nightly.  Side effects precautions discussed with her.      4)  Weight/Diet: Her BMI is 31.9  .  She is a candidate for modest weight loss.  CDE Consult will be initiated , exercise, and detailed carbohydrates information provided.  5) Chronic Care/Health Maintenance:  -she  is on ACEI and Statin medications and  is encouraged to initiate and continue to follow up with Ophthalmology, Dentist,   Podiatrist at least yearly or according to recommendations, and advised to  stay away from smoking. I have recommended yearly flu vaccine and pneumonia vaccine at least every 5 years; moderate intensity exercise for up to 150 minutes weekly; and  sleep for at least 7 hours a day.   POCT ABI Results 05/11/20  Her ABIs normal today May 11, 2020. Right ABI: 1.09      left ABI: 1.1  Right leg systolic / diastolic: 038/882 mmHg Left leg systolic / diastolic: 800/349 mmHg  Arm systolic / diastolic: 179/15 mmHG  This study will be repeated in February 2027, or sooner if needed.  - I advised patient to maintain close follow up with Noreene Larsson, NP for primary care needs.  - Time spent on this patient care encounter:  35 min, of which > 50% was spent in  counseling and the rest reviewing her blood glucose logs , discussing her hypoglycemia and hyperglycemia episodes, reviewing her current and  previous labs / studies  ( including abstraction from other facilities) and medications  doses and developing a  long term treatment plan and documenting her care.   Please refer to Patient Instructions for Blood Glucose Monitoring and Insulin/Medications Dosing Guide"  in media tab for additional information. Please  also refer to " Patient Self Inventory" in the Media  tab for reviewed elements of pertinent patient history.  Melissa Tucker participated in the discussions, expressed understanding, and voiced agreement with the above plans.  All questions were answered to her satisfaction. she is encouraged to contact clinic should she have any questions or concerns prior to her return visit.   Follow up plan: - Return in about 4 weeks (around 06/08/2020) for Bring Meter and Logs- A1c in Office.  Glade Lloyd, MD [facility] [address] [place], [place] [address] Phone: [phone]  Fax: [phone]    05/11/2020, 4:25 PM  This note was  partially  dictated with voice recognition software. Similar sounding words can be transcribed inadequately or may not  be corrected upon review.

## 2020-05-17 ENCOUNTER — Other Ambulatory Visit: Payer: Self-pay | Admitting: "Endocrinology

## 2020-05-17 ENCOUNTER — Other Ambulatory Visit: Payer: Self-pay | Admitting: Nurse Practitioner

## 2020-06-01 ENCOUNTER — Other Ambulatory Visit: Payer: Self-pay | Admitting: "Endocrinology

## 2020-06-07 ENCOUNTER — Telehealth: Payer: Self-pay

## 2020-06-07 ENCOUNTER — Other Ambulatory Visit: Payer: Self-pay | Admitting: Nurse Practitioner

## 2020-06-07 MED ORDER — HYDROCODONE-ACETAMINOPHEN 10-325 MG PO TABS: 1.0000 | ORAL_TABLET | Freq: Four times a day (QID) | ORAL | 0 refills | Status: DC | PRN

## 2020-06-07 NOTE — Telephone Encounter (Signed)
Patient needs refills on hydrocodone sent to [facility] p# [phone]

## 2020-06-07 NOTE — Telephone Encounter (Signed)
Please send refill 

## 2020-06-07 NOTE — Telephone Encounter (Signed)
sent 

## 2020-06-08 ENCOUNTER — Ambulatory Visit: Payer: Medicare Other | Admitting: "Endocrinology

## 2020-06-10 ENCOUNTER — Other Ambulatory Visit: Payer: Self-pay | Admitting: Nurse Practitioner

## 2020-06-15 ENCOUNTER — Ambulatory Visit (INDEPENDENT_AMBULATORY_CARE_PROVIDER_SITE_OTHER): Payer: Medicare Other | Admitting: "Endocrinology

## 2020-06-15 ENCOUNTER — Other Ambulatory Visit: Payer: Self-pay

## 2020-06-15 ENCOUNTER — Encounter: Payer: Self-pay | Admitting: "Endocrinology

## 2020-06-15 VITALS — BP 144/82 | HR 92 | Ht 69.0 in | Wt 207.0 lb

## 2020-06-15 DIAGNOSIS — E782 Mixed hyperlipidemia: Secondary | ICD-10-CM

## 2020-06-15 DIAGNOSIS — E1165 Type 2 diabetes mellitus with hyperglycemia: Secondary | ICD-10-CM | POA: Diagnosis not present

## 2020-06-15 DIAGNOSIS — I10 Essential (primary) hypertension: Secondary | ICD-10-CM

## 2020-06-15 LAB — POCT GLYCOSYLATED HEMOGLOBIN (HGB A1C): HbA1c, POC (controlled diabetic range): 8 % — AB (ref 0.0–7.0)

## 2020-06-15 NOTE — Patient Instructions (Signed)
                                     Advice for Weight Management  -For most of us the best way to lose weight is by diet management. Generally speaking, diet management means consuming less calories intentionally which over time brings about progressive weight loss.  This can be achieved more effectively by restricting carbohydrate consumption to the minimum possible.  So, it is critically important to know your numbers: how much calorie you are consuming and how much calorie you need. More importantly, our carbohydrates sources should be unprocessed or minimally processed complex starch food items.   Sometimes, it is important to balance nutrition by increasing protein intake (animal or plant source), fruits, and vegetables.  -Sticking to a routine mealtime to eat 3 meals a day and avoiding unnecessary snacks is shown to have a big role in weight control. Under normal circumstances, the only time we lose real weight is when we are hungry, so allow hunger to take place- hunger means no food between meal times, only water.  It is not advisable to starve.   -It is better to avoid simple carbohydrates including: Cakes, Sweet Desserts, Ice Cream, Soda (diet and regular), Sweet Tea, Candies, Chips, Cookies, Store Bought Juices, Alcohol in Excess of  1-2 drinks a day, Lemonade,  Artificial Sweeteners, Doughnuts, Coffee Creamers, "Sugar-free" Products, etc, etc.  This is not a complete list.....    -Consulting with certified diabetes educators is proven to provide you with the most accurate and current information on diet.  Also, you may be  interested in discussing diet options/exchanges , we can schedule a visit with Melissa Tucker, RDN, CDE for individualized nutrition education.  -Exercise: If you are able: 30 -60 minutes a day ,4 days a week, or 150 minutes a week.  The longer the better.  Combine stretch, strength, and aerobic activities.  If you were told in the  past that you have high risk for cardiovascular diseases, you may seek evaluation by your heart doctor prior to initiating moderate to intense exercise programs.                                  Additional Care Considerations for Diabetes   -Diabetes  is a chronic disease.  The most important care consideration is regular follow-up with your diabetes care provider with the goal being avoiding or delaying its complications and to take advantage of advances in medications and technology.    -Type 2 diabetes is known to coexist with other important comorbidities such as high blood pressure and high cholesterol.  It is critical to control not only the diabetes but also the high blood pressure and high cholesterol to minimize and delay the risk of complications including coronary artery disease, stroke, amputations, blindness, etc.    - Studies showed that people with diabetes will benefit from a class of medications known as ACE inhibitors and statins.  Unless there are specific reasons not to be on these medications, the standard of care is to consider getting one from these groups of medications at an optimal doses.  These medications are generally considered safe and proven to help protect the heart and the kidneys.    - People with diabetes are encouraged to initiate and maintain regular follow-up with eye doctors, foot   doctors, dentists , and if necessary heart and kidney doctors.     - It is highly recommended that people with diabetes quit smoking or stay away from smoking, and get yearly  flu vaccine and pneumonia vaccine at least every 5 years.  One other important lifestyle recommendation is to ensure adequate sleep - at least 6-7 hours of uninterrupted sleep at night.  -Exercise: If you are able: 30 -60 minutes a day, 4 days a week, or 150 minutes a week.  The longer the better.  Combine stretch, strength, and aerobic activities.  If you were told in the past that you have high risk for  cardiovascular diseases, you may seek evaluation by your heart doctor prior to initiating moderate to intense exercise programs.          

## 2020-06-15 NOTE — Progress Notes (Signed)
06/15/2020, 3:46 PM        Endocrinology follow-up note   Subjective:    Patient ID: Melissa Tucker, female    DOB: 05/27/1950.  Melissa Tucker is seen in follow-up for management of currently uncontrolled symptomatic diabetes, hyperlipidemia, hypertension requested by  Noreene Larsson, NP.   Past Medical History:  Diagnosis Date  . Anxiety and depression   . Chest tightness   . DEEP VENOUS THROMBOPHLEBITIS 10/05/2009   Qualifier: History of  By: Lattie Haw, MD, Claud Kelp   . Diabetes mellitus 1994   No insulin  . DJD (degenerative joint disease)   . History of DVT (deep vein thrombosis) 1989   Following childbirth  . Hyperlipidemia   . Hypertension   . IBS (irritable bowel syndrome)    Past Surgical History:  Procedure Laterality Date  . ABDOMINAL HYSTERECTOMY    . CESAREAN SECTION  1988  . CHOLECYSTECTOMY    . COLONOSCOPY  2005   Normal findings  . JOINT REPLACEMENT N/A    Phreesia 02/28/2020  . ORIF ANKLE FRACTURE Right 05/16/2019   Procedure: OPEN REDUCTION INTERNAL FIXATION (ORIF) ANKLE FRACTURE;  Surgeon: Shona Needles, MD;  Location: [facility];  Service: Orthopedics;  Laterality: Right;  . ORIF FEMUR FRACTURE Right 05/16/2019   Procedure: OPEN REDUCTION INTERNAL FIXATION (ORIF) DISTAL FEMUR FRACTURE;  Surgeon: Shona Needles, MD;  Location: [facility];  Service: Orthopedics;  Laterality: Right;  . SHOULDER SURGERY     Left shoulder manipulation with subcrominal decompression, capsulitis  . TOTAL KNEE ARTHROPLASTY  2003   Left  . TOTAL KNEE ARTHROPLASTY  2007   Right   Social History   Socioeconomic History  . Marital status: Divorced    Spouse name: Not on file  . Number of children: Not on file  . Years of education: Not on file  . Highest education level: Not on file  Occupational History  . Occupation: disabled    Fish farm manager: UNEMPLOYED  Tobacco Use  . Smoking status: Former Smoker    Quit date: 04/10/1988    Years since quitting:  32.2  . Smokeless tobacco: Former Systems developer  . Tobacco comment: Minimal prior use  Vaping Use  . Vaping Use: Never used  Substance and Sexual Activity  . Alcohol use: No  . Drug use: No  . Sexual activity: Yes  Other Topics Concern  . Not on file  Social History Narrative   Divorced   Resides with daughter, also has 2 sons   No regular exercise   Social Determinants of Health   Financial Resource Strain: Not on file  Food Insecurity: Not on file  Transportation Needs: Not on file  Physical Activity: Not on file  Stress: Not on file  Social Connections: Not on file   Outpatient Encounter Medications as of 06/15/2020  Medication Sig  . ALPRAZolam (XANAX) 0.5 MG tablet TAKE 1 TABLET BY MOUTH TWICE DAILY AS NEEDED FOR ANXIETY  . BIOTIN PO Take 1 tablet by mouth daily.  . bisacodyl (DULCOLAX) 5 MG EC tablet Take 1 tablet (5 mg total) by mouth daily as needed for moderate constipation.  . Blood Glucose Monitoring Suppl (ACCU-CHEK GUIDE) w/Device KIT 1 Piece by Does not apply route as directed.  . colesevelam (WELCHOL) 625 MG tablet Take 1,875 mg by mouth 2 (two) times daily as needed (irritable bowel syndrome).   Marland Kitchen dicyclomine (BENTYL) 10 MG capsule Take 10 mg by  mouth daily.    Marland Kitchen escitalopram (LEXAPRO) 10 MG tablet Take 10 mg by mouth daily.  Marland Kitchen glipiZIDE (GLUCOTROL XL) 10 MG 24 hr tablet TAKE 1 TABLET BY MOUTH ONCE A DAY.  Marland Kitchen glucose blood (ACCU-CHEK GUIDE) test strip TESTING ONE TIMES DAILY.  Marland Kitchen HYDROcodone-acetaminophen (NORCO) 10-325 MG tablet Take 1 tablet by mouth 4 (four) times daily as needed.  . INDOMETHACIN PO Take by mouth.  Marland Kitchen lisinopril (PRINIVIL,ZESTRIL) 40 MG tablet Take 10 mg by mouth daily.   . metFORMIN (GLUCOPHAGE) 1000 MG tablet TAKE ONE TABLET BY MOUTH TWICE A DAY WITH A MEAL  . metoCLOPramide (REGLAN) 5 MG tablet Take 5 mg by mouth 4 (four) times daily as needed for nausea or vomiting.   . Multiple Vitamin (MULTIVITAMIN) tablet Take 1 tablet by mouth daily.  .  simvastatin (ZOCOR) 40 MG tablet Take 1 tablet (40 mg total) by mouth at bedtime.  . TRULICITY 1.5 QP/5.9FM SOPN INJECT 1 PEN SUBCUTANEOUSLY ONCE WEEKLY.  . vitamin B-12 (CYANOCOBALAMIN) 1000 MCG tablet Take 1,000 mcg by mouth daily.  Marland Kitchen VITAMIN D PO Take 1 tablet by mouth daily.   No facility-administered encounter medications on file as of 06/15/2020.    ALLERGIES: Allergies  Allergen Reactions  . Peanut-Containing Drug Products Swelling    VACCINATION STATUS: Immunization History  Administered Date(s) Administered  . Fluad Quad(high Dose 65+) 03/09/2020  . Pneumococcal Conjugate-13 03/09/2020    Diabetes She presents for her follow-up diabetic visit. She has type 2 diabetes mellitus. Onset time: She was diagnosed at approximate age of 62 years. Her disease course has been improving. There are no hypoglycemic associated symptoms. Pertinent negatives for hypoglycemia include no confusion, headaches, pallor or seizures. Pertinent negatives for diabetes include no blurred vision, no chest pain, no fatigue, no polydipsia, no polyphagia and no polyuria. There are no hypoglycemic complications. Symptoms are improving. Diabetic complications include retinopathy. Risk factors for coronary artery disease include diabetes mellitus, dyslipidemia, family history, hypertension, obesity, sedentary lifestyle, post-menopausal and tobacco exposure. Current diabetic treatments: She is currently on Invokana 300 mg p.o. daily metformin/glipizide,\ She is compliant with treatment some of the time. Her weight is decreasing steadily (She lost approximately 10 pounds since last visit.). She is following a generally unhealthy diet. When asked about meal planning, she reported none. She has not had a previous visit with a dietitian. She never participates in exercise. Her home blood glucose trend is decreasing steadily. Her breakfast blood glucose range is generally 130-140 mg/dl. Her bedtime blood glucose range is  generally 140-180 mg/dl. Her overall blood glucose range is 140-180 mg/dl. (She presents with significantly improved glycemic profile to target at fasting, to near target postprandial.  Her point-of-care A1c is 8% improving from 9.1% during her last visit.  She did not document any hypoglycemic episodes.  ) An ACE inhibitor/angiotensin II receptor blocker is being taken. Eye exam is current.  Hyperlipidemia This is a chronic problem. The current episode started more than 1 year ago. The problem is controlled. Exacerbating diseases include diabetes and obesity. Pertinent negatives include no chest pain, myalgias or shortness of breath. Current antihyperlipidemic treatment includes statins and ezetimibe. Risk factors for coronary artery disease include dyslipidemia, diabetes mellitus, family history, obesity, hypertension, a sedentary lifestyle and post-menopausal.  Hypertension This is a chronic problem. The current episode started more than 1 year ago. The problem is uncontrolled. Pertinent negatives include no blurred vision, chest pain, headaches, palpitations or shortness of breath. Risk factors for coronary artery disease include  diabetes mellitus, dyslipidemia, family history, obesity, post-menopausal state, sedentary lifestyle and smoking/tobacco exposure. Past treatments include ACE inhibitors. Hypertensive end-organ damage includes retinopathy.    Review of systems Limited as above.  Objective:    BP (!) 144/82   Pulse 92   Ht '5\' 9"'  (1.753 m)   Wt 207 lb (93.9 kg)   BMI 30.57 kg/m   Wt Readings from Last 3 Encounters:  06/15/20 207 lb (93.9 kg)  05/11/20 216 lb 9.6 oz (98.2 kg)  03/09/20 214 lb (97.1 kg)      Physical Exam- Limited  CMP     Component Value Date/Time   NA 139 03/09/2020 1400   K 4.3 03/09/2020 1400   CL 98 03/09/2020 1400   CO2 26 03/09/2020 1400   GLUCOSE 157 (H) 03/09/2020 1400   GLUCOSE 281 (H) 05/15/2019 1505   BUN 11 03/09/2020 1400   CREATININE 0.51  (L) 03/09/2020 1400   CREATININE 0.64 08/14/2018 1142   CALCIUM 10.1 03/09/2020 1400   PROT 7.0 03/09/2020 1400   ALBUMIN 4.7 03/09/2020 1400   AST 23 03/09/2020 1400   ALT 32 03/09/2020 1400   ALKPHOS 131 (H) 03/09/2020 1400   BILITOT 0.4 03/09/2020 1400   GFRNONAA 99 03/09/2020 1400   GFRNONAA 92 08/14/2018 1142   GFRAA 114 03/09/2020 1400   GFRAA 107 08/14/2018 1142     Diabetic Labs (most recent): Lab Results  Component Value Date   HGBA1C 8.0 (A) 06/15/2020   HGBA1C 9.1 (H) 03/09/2020   HGBA1C 7.9 08/20/2019     Lipid Panel ( most recent) Lipid Panel     Component Value Date/Time   CHOL 204 (H) 03/09/2020 1400   TRIG 183 (H) 03/09/2020 1400   HDL 59 03/09/2020 1400   LDLCALC 113 (H) 03/09/2020 1400     Assessment & Plan:   1. Uncontrolled type 2 diabetes mellitus with hyperglycemia ([place])  - Melissa Tucker has currently uncontrolled symptomatic type 2 DM since 70 years of age.  She presents with significantly improved glycemic profile to target at fasting, to near target postprandial.  Her point-of-care A1c is 8% improving from 9.1% during her last visit.  She did not document any hypoglycemic episodes.   -her diabetes is complicated by retinopathy, obesity/sedentary life, history of smoking and Melissa Tucker remains at a high risk for more acute and chronic complications which include CAD, CVA, CKD, retinopathy, and neuropathy. These are all discussed in detail with the patient.  - I have counseled her on diet management and weight loss, by adopting a carbohydrate restricted/protein rich diet.  - she acknowledges that there is a room for improvement in her food and drink choices. - Suggestion is made for her to avoid simple carbohydrates  from her diet including Cakes, Sweet Desserts, Ice Cream, Soda (diet and regular), Sweet Tea, Candies, Chips, Cookies, Store Bought Juices, Alcohol in Excess of  1-2 drinks a day, Artificial Sweeteners,  Coffee Creamer, and  "Sugar-free" Products, Lemonade. This will help patient to have more stable blood glucose profile and potentially avoid unintended weight gain.   - I encouraged her to switch to  unprocessed or minimally processed complex starch and increased protein intake (animal or plant source), fruits, and vegetables.  - she is advised to stick to a routine mealtimes to eat 3 meals  a day and avoid unnecessary snacks ( to snack only to correct hypoglycemia).   - I have approached her with the following individualized plan to manage  diabetes and patient agrees:   -In light of her presentation with near target glycemic profile, she would not need insulin treatment.   patient is hesitant to go on insulin at this point.   -She promises to continue to do better on her dietary management.  She is willing to start monitoring blood glucose at least twice a day-daily before breakfast and at bedtime. -In the meantime, she is advised to continue Trulicity 1.5 mg subcutaneously weekly,Metformin 1000 mg p.o. twice daily, and glipizide 10 mg p.o. daily at breakfast. -She will be considered for higher dose of Trulicity on subsequent visits.  -She is encouraged to call clinic for blood glucose readings less than 70 or greater than 200x3.     2) BP/HTN:  -Her blood pressure is above target.  She is advised to continue her current medications including lisinopril 40 mg p.o. daily, Lasix 40 mg p.o. daily . She will be considered for additional treatment with hydrochlorothiazide on her next visit.   3) Lipids/HPL: Her recent lipid panel showed controlled LDL at 65.  She is advised to continue simvastatin 40 mg p.o. nightly.  Side effects precautions discussed with her.      4)  Weight/Diet: Her BMI is 30.5  .  She is a candidate for modest weight loss.  CDE Consult will be initiated , exercise, and detailed carbohydrates information provided.  5) Chronic Care/Health Maintenance:  -she  is on ACEI and Statin medications  and  is encouraged to initiate and continue to follow up with Ophthalmology, Dentist,  Podiatrist at least yearly or according to recommendations, and advised to  stay away from smoking. I have recommended yearly flu vaccine and pneumonia vaccine at least every 5 years; moderate intensity exercise for up to 150 minutes weekly; and  sleep for at least 7 hours a day.  She did have normal screening ABI on May 11, 2020.  This study will be repeated in February 2027, or sooner if needed.  - I advised patient to maintain close follow up with Noreene Larsson, NP for primary care needs.  - Time spent on this patient care encounter:  40 min, of which > 50% was spent in  counseling and the rest reviewing her blood glucose logs , discussing her hypoglycemia and hyperglycemia episodes, reviewing her current and  previous labs / studies  ( including abstraction from other facilities) and medications  doses and developing a  long term treatment plan and documenting her care.   Please refer to Patient Instructions for Blood Glucose Monitoring and Insulin/Medications Dosing Guide"  in media tab for additional information. Please  also refer to " Patient Self Inventory" in the Media  tab for reviewed elements of pertinent patient history.  Melissa Tucker participated in the discussions, expressed understanding, and voiced agreement with the above plans.  All questions were answered to her satisfaction. she is encouraged to contact clinic should she have any questions or concerns prior to her return visit.   Follow up plan: - Return in about 3 months (around 09/15/2020) for F/U with Pre-visit Labs, Meter, Logs, A1c here.Glade Lloyd, MD [facility] [address] [place], [place] [address] Phone: [phone]  Fax: [phone]    06/15/2020, 3:46 PM  This note was partially dictated with voice recognition software. Similar sounding words can be transcribed  inadequately or may not  be corrected upon review.

## 2020-06-30 ENCOUNTER — Other Ambulatory Visit: Payer: Self-pay | Admitting: "Endocrinology

## 2020-07-07 ENCOUNTER — Telehealth: Payer: Self-pay

## 2020-07-07 ENCOUNTER — Other Ambulatory Visit: Payer: Self-pay | Admitting: Nurse Practitioner

## 2020-07-07 NOTE — Telephone Encounter (Signed)
Already sent it this am

## 2020-07-07 NOTE — Telephone Encounter (Signed)
Please send HYDROcodone-acetaminophen (NORCO) 10-325 MG tablet to the pharmacy

## 2020-07-12 ENCOUNTER — Other Ambulatory Visit: Payer: Self-pay | Admitting: Nurse Practitioner

## 2020-07-21 ENCOUNTER — Telehealth: Payer: Self-pay | Admitting: Nurse Practitioner

## 2020-07-21 ENCOUNTER — Telehealth: Payer: Self-pay

## 2020-07-21 ENCOUNTER — Other Ambulatory Visit: Payer: Self-pay

## 2020-07-21 DIAGNOSIS — I10 Essential (primary) hypertension: Secondary | ICD-10-CM

## 2020-07-21 MED ORDER — LISINOPRIL 10 MG PO TABS: 10.0000 mg | ORAL_TABLET | Freq: Every day | ORAL | 3 refills | Status: DC

## 2020-07-21 NOTE — Telephone Encounter (Signed)
Rx sent in

## 2020-07-21 NOTE — Telephone Encounter (Signed)
Left message for patient to call back and schedule Medicare Annual Wellness Visit (AWV) either virtually or in office.   AWV-I PER [place] 04/10/2009  please schedule at anytime with [facility]  health coach  This should be a 40 minute visit.

## 2020-07-21 NOTE — Telephone Encounter (Signed)
Left message asking patient to return call so that we may get her scheduled for her medicare wellness visit

## 2020-07-21 NOTE — Telephone Encounter (Signed)
Please send in Lisinopril, pt is out, appt next week

## 2020-07-30 ENCOUNTER — Ambulatory Visit: Payer: Medicare Other | Admitting: Nurse Practitioner

## 2020-07-30 ENCOUNTER — Other Ambulatory Visit: Payer: Self-pay | Admitting: "Endocrinology

## 2020-08-03 ENCOUNTER — Telehealth: Payer: Self-pay

## 2020-08-03 NOTE — Telephone Encounter (Signed)
Patient called need med refill.   HYDROcodone-acetaminophen (NORCO) 10-325 MG tablet   Pharmacy: [facility]

## 2020-08-04 ENCOUNTER — Other Ambulatory Visit: Payer: Self-pay | Admitting: Nurse Practitioner

## 2020-08-04 MED ORDER — HYDROCODONE-ACETAMINOPHEN 10-325 MG PO TABS: 1.0000 | ORAL_TABLET | Freq: Four times a day (QID) | ORAL | 0 refills | Status: DC | PRN

## 2020-08-04 NOTE — Telephone Encounter (Signed)
sent 

## 2020-08-05 ENCOUNTER — Other Ambulatory Visit: Payer: Self-pay | Admitting: Nurse Practitioner

## 2020-08-09 ENCOUNTER — Other Ambulatory Visit: Payer: Self-pay | Admitting: Nurse Practitioner

## 2020-08-09 ENCOUNTER — Telehealth: Payer: Self-pay

## 2020-08-09 MED ORDER — ALPRAZOLAM 0.5 MG PO TABS: 0.5000 mg | ORAL_TABLET | Freq: Two times a day (BID) | ORAL | 0 refills | Status: DC | PRN

## 2020-08-09 NOTE — Telephone Encounter (Signed)
Patient called need med refill ALPRAZolam Prudy Feeler) 0.5 MG tablet send to [facility].

## 2020-08-09 NOTE — Telephone Encounter (Signed)
Sent. Should be able to pick-up on 08/11/20

## 2020-08-11 ENCOUNTER — Other Ambulatory Visit: Payer: Self-pay

## 2020-08-11 ENCOUNTER — Encounter (INDEPENDENT_AMBULATORY_CARE_PROVIDER_SITE_OTHER): Payer: Self-pay | Admitting: *Deleted

## 2020-08-11 ENCOUNTER — Ambulatory Visit (INDEPENDENT_AMBULATORY_CARE_PROVIDER_SITE_OTHER): Payer: Medicare Other | Admitting: Nurse Practitioner

## 2020-08-11 ENCOUNTER — Encounter: Payer: Self-pay | Admitting: Nurse Practitioner

## 2020-08-11 ENCOUNTER — Other Ambulatory Visit: Payer: Self-pay | Admitting: "Endocrinology

## 2020-08-11 VITALS — BP 187/78 | HR 98 | Temp 98.2°F | Resp 20 | Ht 69.0 in | Wt 208.0 lb

## 2020-08-11 DIAGNOSIS — G8929 Other chronic pain: Secondary | ICD-10-CM | POA: Diagnosis not present

## 2020-08-11 DIAGNOSIS — E1165 Type 2 diabetes mellitus with hyperglycemia: Secondary | ICD-10-CM | POA: Diagnosis not present

## 2020-08-11 DIAGNOSIS — Z79899 Other long term (current) drug therapy: Secondary | ICD-10-CM | POA: Diagnosis not present

## 2020-08-11 DIAGNOSIS — I10 Essential (primary) hypertension: Secondary | ICD-10-CM | POA: Diagnosis not present

## 2020-08-11 DIAGNOSIS — E782 Mixed hyperlipidemia: Secondary | ICD-10-CM

## 2020-08-11 DIAGNOSIS — E785 Hyperlipidemia, unspecified: Secondary | ICD-10-CM

## 2020-08-11 DIAGNOSIS — M549 Dorsalgia, unspecified: Secondary | ICD-10-CM | POA: Insufficient documentation

## 2020-08-11 DIAGNOSIS — M545 Low back pain, unspecified: Secondary | ICD-10-CM

## 2020-08-11 DIAGNOSIS — Z1211 Encounter for screening for malignant neoplasm of colon: Secondary | ICD-10-CM

## 2020-08-11 MED ORDER — AMLODIPINE BESYLATE 5 MG PO TABS: 5.0000 mg | ORAL_TABLET | Freq: Every day | ORAL | 1 refills | Status: DC

## 2020-08-11 NOTE — Progress Notes (Signed)
Acute Office Visit  Subjective:    Patient ID: Melissa Tucker, female    DOB: [birth year], 70 y.o.   MRN: [id number]  Chief Complaint  Patient presents with  . Hypertension  . Hyperlipidemia    HPI Patient is in today for follow-up for HTN, DM, HLD, and anx/dep.  She has hx of white coat syndrome and has elevated BP at OVs.  She denies adverse med effects.  She is followed by Dr. Dorris Fetch for her diabetic management, and her last A1c was 8.0 on 06-29-2020.  Her aunt died this week, and she is feeling down.  She takes xanax once BID, and she states she does well during the morning and at night, but struggles during the day.  She states she gets depressed easily after she broke her leg.  She states that her leg feels better, but she still has pain at times.  She does not feel like she can do a [place] today.  She has been taking opioids for years since she was seeing Dr. Luan Pulling for back pain.  Past Medical History:  Diagnosis Date  . Anxiety and depression   . Chest tightness   . DEEP VENOUS THROMBOPHLEBITIS 10/05/2009   Qualifier: History of  By: Lattie Haw, MD, Claud Kelp   . Diabetes mellitus 1994   No insulin  . DJD (degenerative joint disease)   . History of DVT (deep vein thrombosis) 1989   Following childbirth  . Hyperlipidemia   . Hypertension   . IBS (irritable bowel syndrome)   . Rotator cuff syndrome of right shoulder 08/22/2011    Past Surgical History:  Procedure Laterality Date  . ABDOMINAL HYSTERECTOMY    . CESAREAN SECTION  1988  . CHOLECYSTECTOMY    . COLONOSCOPY  2005   Normal findings  . JOINT REPLACEMENT N/A    Phreesia 02/28/2020  . ORIF ANKLE FRACTURE Right 05/16/2019   Procedure: OPEN REDUCTION INTERNAL FIXATION (ORIF) ANKLE FRACTURE;  Surgeon: Shona Needles, MD;  Location: [place];  Service: Orthopedics;  Laterality: Right;  . ORIF FEMUR FRACTURE Right 05/16/2019   Procedure: OPEN REDUCTION INTERNAL FIXATION (ORIF) DISTAL FEMUR FRACTURE;  Surgeon: Shona Needles, MD;  Location: [place];  Service: Orthopedics;  Laterality: Right;  . SHOULDER SURGERY     Left shoulder manipulation with subcrominal decompression, capsulitis  . TOTAL KNEE ARTHROPLASTY  2003   Left  . TOTAL KNEE ARTHROPLASTY  2007   Right    Family History  Problem Relation Age of Onset  . Heart disease Mother   . Hypertension Mother   . Cervical cancer Mother   . Esophageal cancer Father   . Hypertension Sister     Social History   Socioeconomic History  . Marital status: Divorced    Spouse name: Not on file  . Number of children: Not on file  . Years of education: Not on file  . Highest education level: Not on file  Occupational History  . Occupation: disabled    Fish farm manager: UNEMPLOYED  Tobacco Use  . Smoking status: Former Smoker    Quit date: 04/10/1988    Years since quitting: 32.3  . Smokeless tobacco: Former Systems developer  . Tobacco comment: Minimal prior use  Vaping Use  . Vaping Use: Never used  Substance and Sexual Activity  . Alcohol use: No  . Drug use: No  . Sexual activity: Yes  Other Topics Concern  . Not on file  Social History Narrative   Divorced  Resides with daughter, also has 2 sons   No regular exercise   Social Determinants of Health   Financial Resource Strain: Not on file  Food Insecurity: Not on file  Transportation Needs: Not on file  Physical Activity: Not on file  Stress: Not on file  Social Connections: Not on file  Intimate Partner Violence: Not on file    Outpatient Medications Prior to Visit  Medication Sig Dispense Refill  . ALPRAZolam (XANAX) 0.5 MG tablet Take 1 tablet (0.5 mg total) by mouth 2 (two) times daily as needed for anxiety. 60 tablet 0  . BIOTIN PO Take 1 tablet by mouth daily.    . bisacodyl (DULCOLAX) 5 MG EC tablet Take 1 tablet (5 mg total) by mouth daily as needed for moderate constipation. 30 tablet 0  . Blood Glucose Monitoring Suppl (ACCU-CHEK GUIDE) w/Device KIT USE TO TEST BLOOD GLUCOSE TWICE DAILY  AS DIRECTED. 1 kit 0  . colesevelam (WELCHOL) 625 MG tablet Take 1,875 mg by mouth 2 (two) times daily as needed (irritable bowel syndrome).     Marland Kitchen dicyclomine (BENTYL) 10 MG capsule Take 10 mg by mouth daily.      Marland Kitchen escitalopram (LEXAPRO) 10 MG tablet Take 10 mg by mouth daily.    Marland Kitchen glipiZIDE (GLUCOTROL XL) 10 MG 24 hr tablet TAKE 1 TABLET BY MOUTH ONCE A DAY. 90 tablet 0  . glucose blood (ACCU-CHEK GUIDE) test strip TESTING ONE TIMES DAILY. 100 strip 1  . HYDROcodone-acetaminophen (NORCO) 10-325 MG tablet Take 1 tablet by mouth 4 (four) times daily as needed. 120 tablet 0  . lisinopril (ZESTRIL) 10 MG tablet Take 1 tablet (10 mg total) by mouth daily. 90 tablet 3  . metFORMIN (GLUCOPHAGE) 1000 MG tablet TAKE ONE TABLET BY MOUTH TWICE A DAY WITH A MEAL 180 tablet 0  . metoCLOPramide (REGLAN) 5 MG tablet Take 5 mg by mouth 4 (four) times daily as needed for nausea or vomiting.     . Multiple Vitamin (MULTIVITAMIN) tablet Take 1 tablet by mouth daily.    . simvastatin (ZOCOR) 40 MG tablet Take 1 tablet (40 mg total) by mouth at bedtime. 90 tablet 1  . TRULICITY 1.5 OF/7.5ZW SOPN INJECT 1 PEN SUBCUTANEOUSLY ONCE WEEKLY. 6 mL 0  . vitamin B-12 (CYANOCOBALAMIN) 1000 MCG tablet Take 1,000 mcg by mouth daily.    Marland Kitchen VITAMIN D PO Take 1 tablet by mouth daily.    . INDOMETHACIN PO Take by mouth.     No facility-administered medications prior to visit.    Allergies  Allergen Reactions  . Peanut-Containing Drug Products Swelling    Review of Systems  Constitutional: Negative.   Respiratory: Negative.   Cardiovascular: Negative.   Musculoskeletal: Positive for arthralgias and back pain.  Psychiatric/Behavioral: Positive for dysphoric mood. Negative for self-injury and suicidal ideas. The patient is nervous/anxious.        Objective:    Physical Exam Constitutional:      Appearance: Normal appearance.  Cardiovascular:     Rate and Rhythm: Normal rate and regular rhythm.     Pulses: Normal  pulses.     Heart sounds: Normal heart sounds.  Pulmonary:     Effort: Pulmonary effort is normal.     Breath sounds: Normal breath sounds.  Musculoskeletal:     Comments: Back pain with ROM  Neurological:     Mental Status: She is alert.     BP (!) 187/78   Pulse 98   Temp 98.2 F (  36.8 C)   Resp 20   Ht _0  (1.753 m)   Wt 208 lb (94.3 kg)   SpO2 91%   BMI 30.72 kg/m  Wt Readings from Last 3 Encounters:  08/11/20 208 lb (94.3 kg)  06/15/20 207 lb (93.9 kg)  05/11/20 216 lb 9.6 oz (98.2 kg)    Health Maintenance Due  Topic Date Due  . FOOT EXAM  Never done  . OPHTHALMOLOGY EXAM  Never done  . TETANUS/TDAP  Never done  . COLONOSCOPY (Pts 45-69yr Insurance coverage will need to be confirmed)  Never done  . DEXA SCAN  Never done    There are no preventive care reminders to display for this patient.   Lab Results  Component Value Date   TSH 0.58 01/11/2018   Lab Results  Component Value Date   WBC 7.1 03/09/2020   HGB 12.0 03/09/2020   HCT 36.7 03/09/2020   MCV 89 03/09/2020   PLT 365 03/09/2020   Lab Results  Component Value Date   NA 139 03/09/2020   K 4.3 03/09/2020   CO2 26 03/09/2020   GLUCOSE 157 (H) 03/09/2020   BUN 11 03/09/2020   CREATININE 0.51 (L) 03/09/2020   BILITOT 0.4 03/09/2020   ALKPHOS 131 (H) 03/09/2020   AST 23 03/09/2020   ALT 32 03/09/2020   PROT 7.0 03/09/2020   ALBUMIN 4.7 03/09/2020   CALCIUM 10.1 03/09/2020   ANIONGAP 10 05/15/2019   Lab Results  Component Value Date   CHOL 204 (H) 03/09/2020   Lab Results  Component Value Date   HDL 59 03/09/2020   Lab Results  Component Value Date   LDLCALC 113 (H) 03/09/2020   Lab Results  Component Value Date   TRIG 183 (H) 03/09/2020   No results found for: [facility] Results  Component Value Date   HGBA1C 8.0 (A) 06/15/2020       Assessment & Plan:   Problem List Items Addressed This Visit      Cardiovascular and Mediastinum   Essential hypertension,  benign    BP Readings from Last 3 Encounters:  08/11/20 (!) 187/78  06/15/20 (!) 144/82  05/11/20 (!) 179/96   -BP elevated today at 187/78 -Rx. amlodipine      Relevant Medications   amLODipine (NORVASC) 5 MG tablet   Other Relevant Orders   CBC with Differential/Platelet   CMP14+EGFR   Lipid Panel With LDL/HDL Ratio     Endocrine   Uncontrolled type 2 diabetes mellitus with hyperglycemia (HCC)    -one ACEi and statin -last A1c was 8.0 -followed by Dr. NDorris Fetch -on metformin and glipizide as well as trulicity      Relevant Orders   Lipid Panel With LDL/HDL Ratio     Other   Mixed hyperlipidemia    -checking labs today      Relevant Medications   amLODipine (NORVASC) 5 MG tablet   Long-term use of high-risk medication - Primary    -will check drug screen today -we discussed GDR for opioids, but she doesn't think she can tolerate this -referral to physical med      Relevant Orders   [id number]+Oxyco+Alc+Crt-Bund   Ambulatory referral to Pain Clinic   Chronic back pain    -ongoing for years -discussed GDR for pain medicine, but she doesn't think she can tolerate this -referral to physical medicine      Relevant Orders   Ambulatory referral to Pain Clinic    Other Visit Diagnoses  Hyperlipidemia, unspecified hyperlipidemia type       Relevant Medications   amLODipine (NORVASC) 5 MG tablet   Other Relevant Orders   Lipid Panel With LDL/HDL Ratio   Screening for malignant neoplasm of colon       Relevant Orders   Ambulatory referral to Gastroenterology       Meds ordered this encounter  Medications  . amLODipine (NORVASC) 5 MG tablet    Sig: Take 1 tablet (5 mg total) by mouth daily.    Dispense:  90 tablet    Refill:  [place], NP

## 2020-08-11 NOTE — Assessment & Plan Note (Signed)
-  ongoing for years -discussed GDR for pain medicine, but she doesn't think she can tolerate this -referral to physical medicine

## 2020-08-11 NOTE — Assessment & Plan Note (Signed)
-  checking labs today 

## 2020-08-11 NOTE — Assessment & Plan Note (Addendum)
-  will check drug screen today -we discussed GDR for opioids, but she doesn't think she can tolerate this -referral to physical med

## 2020-08-11 NOTE — Patient Instructions (Signed)
Please have labs drawn today.  We will check BP again in 1 month and have a medicine check in 3 months.

## 2020-08-11 NOTE — Assessment & Plan Note (Signed)
-  one ACEi and statin -last A1c was 8.0 -followed by Dr. Fransico Him. -on metformin and glipizide as well as trulicity

## 2020-08-11 NOTE — Assessment & Plan Note (Signed)
BP Readings from Last 3 Encounters:  08/11/20 (!) 187/78  06/15/20 (!) 144/82  05/11/20 (!) 179/96   -BP elevated today at 187/78 -Rx. amlodipine

## 2020-08-12 LAB — CMP14+EGFR
ALT: 14 IU/L (ref 0–32)
AST: 11 IU/L (ref 0–40)
Albumin/Globulin Ratio: 2.4 — ABNORMAL HIGH (ref 1.2–2.2)
Albumin: 5 g/dL — ABNORMAL HIGH (ref 3.8–4.8)
Alkaline Phosphatase: 110 IU/L (ref 44–121)
BUN/Creatinine Ratio: 19 (ref 12–28)
BUN: 12 mg/dL (ref 8–27)
Bilirubin Total: 0.2 mg/dL (ref 0.0–1.2)
CO2: 27 mmol/L (ref 20–29)
Calcium: 10.3 mg/dL (ref 8.7–10.3)
Chloride: 101 mmol/L (ref 96–106)
Creatinine, Ser: 0.63 mg/dL (ref 0.57–1.00)
Globulin, Total: 2.1 g/dL (ref 1.5–4.5)
Glucose: 163 mg/dL — ABNORMAL HIGH (ref 65–99)
Potassium: 4.4 mmol/L (ref 3.5–5.2)
Sodium: 142 mmol/L (ref 134–144)
Total Protein: 7.1 g/dL (ref 6.0–8.5)
eGFR: 96 mL/min/{1.73_m2} (ref >59)

## 2020-08-12 LAB — LIPID PANEL WITH LDL/HDL RATIO
Cholesterol, Total: 151 mg/dL (ref 100–199)
HDL: 61 mg/dL (ref >39)
LDL Chol Calc (NIH): 72 mg/dL (ref 0–99)
LDL/HDL Ratio: 1.2 ratio (ref 0.0–3.2)
Triglycerides: 98 mg/dL (ref 0–149)
VLDL Cholesterol Cal: 18 mg/dL (ref 5–40)

## 2020-08-12 LAB — CBC WITH DIFFERENTIAL/PLATELET
Basophils Absolute: 0 10*3/uL (ref 0.0–0.2)
Basos: 0 %
EOS (ABSOLUTE): 0.1 10*3/uL (ref 0.0–0.4)
Eos: 1 %
Hematocrit: 34.5 % (ref 34.0–46.6)
Hemoglobin: 11.3 g/dL (ref 11.1–15.9)
Immature Grans (Abs): 0 10*3/uL (ref 0.0–0.1)
Immature Granulocytes: 0 %
Lymphocytes Absolute: 1.9 10*3/uL (ref 0.7–3.1)
Lymphs: 31 %
MCH: 29.7 pg (ref 26.6–33.0)
MCHC: 32.8 g/dL (ref 31.5–35.7)
MCV: 91 fL (ref 79–97)
Monocytes Absolute: 0.4 10*3/uL (ref 0.1–0.9)
Monocytes: 7 %
Neutrophils Absolute: 3.9 10*3/uL (ref 1.4–7.0)
Neutrophils: 61 %
Platelets: 397 10*3/uL (ref 150–450)
RBC: 3.8 x10E6/uL (ref 3.77–5.28)
RDW: 11.4 % — ABNORMAL LOW (ref 11.7–15.4)
WBC: 6.3 10*3/uL (ref 3.4–10.8)

## 2020-08-12 NOTE — Progress Notes (Signed)
Cholesterol is looking good. Albumin, a protein in your blood, is slightly elevated. Try drinking more water. We will monitor this with routine labs.

## 2020-08-13 ENCOUNTER — Other Ambulatory Visit: Payer: Self-pay | Admitting: "Endocrinology

## 2020-08-26 LAB — DRUG SCREEN 764883 11+OXYCO+ALC+CRT-BUND
Amphetamines, Urine: NEGATIVE ng/mL
Barbiturate: NEGATIVE ng/mL
Cocaine (Metabolite): NEGATIVE ng/mL
Creatinine: 133.7 mg/dL (ref 20.0–300.0)
Ethanol: NEGATIVE %
Meperidine: NEGATIVE ng/mL
Methadone Screen, Urine: NEGATIVE ng/mL
Oxycodone/Oxymorphone, Urine: NEGATIVE ng/mL
Phencyclidine: NEGATIVE ng/mL
Propoxyphene: NEGATIVE ng/mL
Tramadol: NEGATIVE ng/mL
pH, Urine: 5.7 (ref 4.5–8.9)

## 2020-08-26 LAB — BENZODIAZEPINES CONFIRM, URINE
Alprazolam Conf.: 512 ng/mL
Alprazolam: POSITIVE — AB
Benzodiazepines: POSITIVE ng/mL — AB
Clonazepam: NEGATIVE
Flurazepam: NEGATIVE
Lorazepam: NEGATIVE
Midazolam: NEGATIVE
Nordiazepam: NEGATIVE
Oxazepam: NEGATIVE
Temazepam: NEGATIVE
Triazolam: NEGATIVE

## 2020-08-26 LAB — OPIATES CONFIRMATION, URINE
Codeine: NEGATIVE
Hydrocodone Confirm: 2197 ng/mL
Hydrocodone: POSITIVE — AB
Hydromorphone Confirm: 1045 ng/mL
Hydromorphone: POSITIVE — AB
Morphine: NEGATIVE
Opiates: POSITIVE ng/mL — AB

## 2020-08-26 LAB — CANNABINOID CONFIRMATION, UR: CANNABINOIDS: NEGATIVE

## 2020-08-27 ENCOUNTER — Encounter: Payer: Self-pay | Admitting: Adult Health

## 2020-08-27 ENCOUNTER — Ambulatory Visit (INDEPENDENT_AMBULATORY_CARE_PROVIDER_SITE_OTHER): Payer: Medicare Other | Admitting: Adult Health

## 2020-08-27 ENCOUNTER — Other Ambulatory Visit: Payer: Self-pay

## 2020-08-27 VITALS — BP 159/89 | HR 108 | Ht 69.0 in | Wt 209.0 lb

## 2020-08-27 DIAGNOSIS — B379 Candidiasis, unspecified: Secondary | ICD-10-CM | POA: Diagnosis not present

## 2020-08-27 DIAGNOSIS — N898 Other specified noninflammatory disorders of vagina: Secondary | ICD-10-CM | POA: Insufficient documentation

## 2020-08-27 DIAGNOSIS — R300 Dysuria: Secondary | ICD-10-CM | POA: Diagnosis not present

## 2020-08-27 DIAGNOSIS — B9689 Other specified bacterial agents as the cause of diseases classified elsewhere: Secondary | ICD-10-CM | POA: Insufficient documentation

## 2020-08-27 DIAGNOSIS — N760 Acute vaginitis: Secondary | ICD-10-CM | POA: Diagnosis not present

## 2020-08-27 LAB — POCT WET PREP (WET MOUNT)
Clue Cells Wet Prep Whiff POC: NEGATIVE
WBC, Wet Prep HPF POC: POSITIVE

## 2020-08-27 LAB — POCT URINALYSIS DIPSTICK OB
Blood, UA: NEGATIVE
Leukocytes, UA: NEGATIVE
Nitrite, UA: NEGATIVE

## 2020-08-27 MED ORDER — METRONIDAZOLE 500 MG PO TABS: 500.0000 mg | ORAL_TABLET | Freq: Two times a day (BID) | ORAL | 0 refills | Status: DC

## 2020-08-27 MED ORDER — FLUCONAZOLE 150 MG PO TABS: ORAL_TABLET | ORAL | 1 refills | Status: DC

## 2020-08-27 NOTE — Progress Notes (Signed)
  Subjective:     Patient ID: Melissa Tucker, female   DOB: Nov 12, 1950, 70 y.o.   MRN: [id number]  HPI Melissa Tucker is a 70 year old black female, divorced, sp hysterectomy worked in today for burning in vaginal area, and burns with urination , has tried monistat and AZO without relief, and has noticed a brown discharge. PCP is Laury Axon NP.    Review of Systems Burning in vaginal area Burns with urination Has brown vaginal discharge Not sexually active Reviewed past medical,surgical, social and family history. Reviewed medications and allergies.     Objective:   Physical Exam BP (!) 159/89 (BP Location: Left Arm, Patient Position: Sitting, Cuff Size: Normal)   Pulse (!) 108   Ht 5\' 9"  (1.753 m)   Wt 209 lb (94.8 kg)   BMI 30.86 kg/m  Skin warm and dry.Pelvic: external genitalia is normal in appearance no lesions, vagina: creamy white discharge without odor,urethra has no lesions or masses noted, cervix and uterus are absent, adnexa: no masses or tenderness noted. Bladder is non tender and no masses felt. Wet prep: + for clue cells and +WBCs and yeast   Upstream - 08/27/20 1130      Pregnancy Intention Screening   Does the patient want to become pregnant in the next year? No    Does the patient's partner want to become pregnant in the next year? No    Would the patient like to discuss contraceptive options today? No      Contraception Wrap Up   Current Method --   hyst   End Method --   hyst   Contraception Counseling Provided No         Examination chaperoned by 08/29/20 LPN    Assessment:     1. Dysuria Will send urine for UA C&S - POC Urinalysis Dipstick OB - Urinalysis - Urine Culture  2. Vaginal discharge - POCT Wet Prep [facility])  3. BV (bacterial vaginosis) Will rx flagyl - POCT Wet Prep [facility])  4. Yeast infection Will rx diflucan  - POCT Wet Prep [facility])    Meds ordered this encounter  Medications  . fluconazole (DIFLUCAN) 150 MG tablet    Sig:  Take 1 now and 1 in 3 days of needed    Dispense:  2 tablet    Refill:  1    Order Specific Question:   Supervising Provider    Answer:   [facility] [[id number]]  . metroNIDAZOLE (FLAGYL) 500 MG tablet    Sig: Take 1 tablet (500 mg total) by mouth 2 (two) times daily.    Dispense:  14 tablet    Refill:  0    Order Specific Question:   Supervising Provider    Answer:   Despina Hidden [[id number]]    Plan:     Follow up prn

## 2020-08-28 LAB — URINALYSIS
Bilirubin, UA: NEGATIVE
Leukocytes,UA: NEGATIVE
Nitrite, UA: NEGATIVE
RBC, UA: NEGATIVE
Specific Gravity, UA: 1.029 (ref 1.005–1.030)
Urobilinogen, Ur: 0.2 mg/dL (ref 0.2–1.0)
pH, UA: 5.5 (ref 5.0–7.5)

## 2020-08-29 LAB — URINE CULTURE

## 2020-08-30 ENCOUNTER — Other Ambulatory Visit: Payer: Self-pay | Admitting: Nurse Practitioner

## 2020-08-30 ENCOUNTER — Telehealth: Payer: Self-pay | Admitting: *Deleted

## 2020-08-30 NOTE — Telephone Encounter (Signed)
Pt aware there was no growth on urine. Pt voiced understanding. JSY

## 2020-08-30 NOTE — Telephone Encounter (Signed)
-----   Message from Adline Potter, NP sent at 08/30/2020 10:23 AM EDT ----- Let her know that there was no growth on urine

## 2020-09-06 ENCOUNTER — Other Ambulatory Visit: Payer: Self-pay | Admitting: Nurse Practitioner

## 2020-09-10 ENCOUNTER — Ambulatory Visit (INDEPENDENT_AMBULATORY_CARE_PROVIDER_SITE_OTHER): Payer: Medicare Other | Admitting: Nurse Practitioner

## 2020-09-10 ENCOUNTER — Other Ambulatory Visit: Payer: Self-pay

## 2020-09-10 ENCOUNTER — Encounter: Payer: Self-pay | Admitting: Nurse Practitioner

## 2020-09-10 DIAGNOSIS — Z79899 Other long term (current) drug therapy: Secondary | ICD-10-CM

## 2020-09-10 DIAGNOSIS — I10 Essential (primary) hypertension: Secondary | ICD-10-CM

## 2020-09-10 DIAGNOSIS — F411 Generalized anxiety disorder: Secondary | ICD-10-CM | POA: Diagnosis not present

## 2020-09-10 DIAGNOSIS — E1165 Type 2 diabetes mellitus with hyperglycemia: Secondary | ICD-10-CM | POA: Diagnosis not present

## 2020-09-10 NOTE — Assessment & Plan Note (Signed)
-  BP is well-controlled today -continue current meds

## 2020-09-10 NOTE — Assessment & Plan Note (Signed)
-  chronic back pain -UDS today

## 2020-09-10 NOTE — Assessment & Plan Note (Signed)
-  she feels like her anxiety has increased today -we will set her up with therapy with LCSW -taking lexapro and xanax

## 2020-09-10 NOTE — Progress Notes (Signed)
Acute Office Visit  Subjective:    Patient ID: Melissa Tucker, female    DOB: Sep 20, 1950, 70 y.o.   MRN: [id number]  Chief Complaint  Patient presents with  . Hypertension    Follow up    HPI Patient is in today for BP check.  At her last OV, her BP was 187/78, so we started her on amlodipine in addition to her lisinopril.  She has been checking her BP at home, and she states her BP has been getting better and better every day.  She has anxiety about her children. She states that she has not tried therapy.   Past Medical History:  Diagnosis Date  . Anxiety and depression   . Chest tightness   . DEEP VENOUS THROMBOPHLEBITIS 10/05/2009   Qualifier: History of  By: Lattie Haw, MD, Claud Kelp   . Diabetes mellitus 1994   No insulin  . DJD (degenerative joint disease)   . History of DVT (deep vein thrombosis) 1989   Following childbirth  . Hyperlipidemia   . Hypertension   . IBS (irritable bowel syndrome)   . Rotator cuff syndrome of right shoulder 08/22/2011    Past Surgical History:  Procedure Laterality Date  . ABDOMINAL HYSTERECTOMY    . CESAREAN SECTION  1988  . CHOLECYSTECTOMY    . COLONOSCOPY  2005   Normal findings  . JOINT REPLACEMENT N/A    Phreesia 02/28/2020  . ORIF ANKLE FRACTURE Right 05/16/2019   Procedure: OPEN REDUCTION INTERNAL FIXATION (ORIF) ANKLE FRACTURE;  Surgeon: Shona Needles, MD;  Location: [place];  Service: Orthopedics;  Laterality: Right;  . ORIF FEMUR FRACTURE Right 05/16/2019   Procedure: OPEN REDUCTION INTERNAL FIXATION (ORIF) DISTAL FEMUR FRACTURE;  Surgeon: Shona Needles, MD;  Location: [place];  Service: Orthopedics;  Laterality: Right;  . SHOULDER SURGERY     Left shoulder manipulation with subcrominal decompression, capsulitis  . TOTAL KNEE ARTHROPLASTY  2003   Left  . TOTAL KNEE ARTHROPLASTY  2007   Right    Family History  Problem Relation Age of Onset  . Heart disease Mother   . Hypertension Mother   . Cervical cancer Mother    . Esophageal cancer Father   . Hypertension Sister     Social History   Socioeconomic History  . Marital status: Divorced    Spouse name: Not on file  . Number of children: Not on file  . Years of education: Not on file  . Highest education level: Not on file  Occupational History  . Occupation: disabled    Fish farm manager: UNEMPLOYED  Tobacco Use  . Smoking status: Former Smoker    Quit date: 04/10/1988    Years since quitting: 32.4  . Smokeless tobacco: Former Systems developer  . Tobacco comment: Minimal prior use  Vaping Use  . Vaping Use: Never used  Substance and Sexual Activity  . Alcohol use: No  . Drug use: No  . Sexual activity: Not Currently    Birth control/protection: Surgical    Comment: hyst  Other Topics Concern  . Not on file  Social History Narrative   Divorced   Resides with daughter, also has 2 sons   No regular exercise   Social Determinants of Health   Financial Resource Strain: Not on file  Food Insecurity: Not on file  Transportation Needs: Not on file  Physical Activity: Not on file  Stress: Not on file  Social Connections: Not on file  Intimate Partner Violence: Not  on file    Outpatient Medications Prior to Visit  Medication Sig Dispense Refill  . ALPRAZolam (XANAX) 0.5 MG tablet TAKE 1 TABLET BY MOUTH TWICE DAILY AS NEEDED FOR ANXIETY 60 tablet 0  . amLODipine (NORVASC) 5 MG tablet Take 1 tablet (5 mg total) by mouth daily. 90 tablet 1  . BIOTIN PO Take 1 tablet by mouth daily.    . bisacodyl (DULCOLAX) 5 MG EC tablet Take 1 tablet (5 mg total) by mouth daily as needed for moderate constipation. 30 tablet 0  . Blood Glucose Monitoring Suppl (ACCU-CHEK GUIDE) w/Device KIT USE TO TEST BLOOD GLUCOSE TWICE DAILY AS DIRECTED. 1 kit 0  . colesevelam (WELCHOL) 625 MG tablet Take 1,875 mg by mouth 2 (two) times daily as needed (irritable bowel syndrome).    Marland Kitchen dicyclomine (BENTYL) 10 MG capsule Take 10 mg by mouth daily.    Marland Kitchen escitalopram (LEXAPRO) 10 MG tablet  Take 10 mg by mouth daily.    . fluconazole (DIFLUCAN) 150 MG tablet Take 1 now and 1 in 3 days of needed 2 tablet 1  . glipiZIDE (GLUCOTROL XL) 10 MG 24 hr tablet TAKE 1 TABLET BY MOUTH ONCE A DAY. 90 tablet 0  . glucose blood (ACCU-CHEK GUIDE) test strip TESTING ONE TIMES DAILY. 100 strip 1  . HYDROcodone-acetaminophen (NORCO) 10-325 MG tablet TAKE 1 TABLET BY MOUTH FOUR TIMES DAILYAS NEEDED. 120 tablet 0  . lisinopril (ZESTRIL) 10 MG tablet Take 1 tablet (10 mg total) by mouth daily. 90 tablet 3  . metFORMIN (GLUCOPHAGE) 1000 MG tablet TAKE ONE TABLET BY MOUTH TWICE A DAY WITH A MEAL 180 tablet 0  . metoCLOPramide (REGLAN) 5 MG tablet Take 5 mg by mouth 4 (four) times daily as needed for nausea or vomiting.    . Multiple Vitamin (MULTIVITAMIN) tablet Take 1 tablet by mouth daily.    . simvastatin (ZOCOR) 40 MG tablet Take 1 tablet (40 mg total) by mouth at bedtime. 90 tablet 1  . TRULICITY 1.5 AV/4.0JW SOPN INJECT 1 PEN SUBCUTANEOUSLY ONCE WEEKLY. 6 mL 0  . vitamin B-12 (CYANOCOBALAMIN) 1000 MCG tablet Take 1,000 mcg by mouth daily.    Marland Kitchen VITAMIN D PO Take 1 tablet by mouth daily.    . metroNIDAZOLE (FLAGYL) 500 MG tablet Take 1 tablet (500 mg total) by mouth 2 (two) times daily. (Patient not taking: Reported on 09/10/2020) 14 tablet 0   No facility-administered medications prior to visit.    Allergies  Allergen Reactions  . Peanut-Containing Drug Products Swelling    Review of Systems  Constitutional: Negative.   Respiratory: Negative.   Cardiovascular: Negative.   Psychiatric/Behavioral: Negative for self-injury and suicidal ideas. The patient is nervous/anxious.        Objective:    Physical Exam Constitutional:      Appearance: Normal appearance.  Cardiovascular:     Rate and Rhythm: Normal rate and regular rhythm.     Pulses: Normal pulses.     Heart sounds: Normal heart sounds.  Pulmonary:     Effort: Pulmonary effort is normal.     Breath sounds: Normal breath sounds.   Neurological:     Mental Status: She is alert.  Psychiatric:        Mood and Affect: Mood normal.        Behavior: Behavior normal.        Thought Content: Thought content normal.        Judgment: Judgment normal.     BP 134/70 (  BP Location: Right Arm, Patient Position: Sitting, Cuff Size: Large)   Pulse 86   Temp (!) 96.9 F (36.1 C) (Temporal)   Ht 5' 9" (1.753 m)   Wt 206 lb (93.4 kg)   SpO2 95%   BMI 30.42 kg/m  Wt Readings from Last 3 Encounters:  09/10/20 206 lb (93.4 kg)  08/27/20 209 lb (94.8 kg)  08/11/20 208 lb (94.3 kg)    Health Maintenance Due  Topic Date Due  . FOOT EXAM  Never done  . OPHTHALMOLOGY EXAM  Never done  . COLONOSCOPY (Pts 45-37yr Insurance coverage will need to be confirmed)  Never done  . DEXA SCAN  Never done    There are no preventive care reminders to display for this patient.   Lab Results  Component Value Date   TSH 0.58 01/11/2018   Lab Results  Component Value Date   WBC 6.3 08/11/2020   HGB 11.3 08/11/2020   HCT 34.5 08/11/2020   MCV 91 08/11/2020   PLT 397 08/11/2020   Lab Results  Component Value Date   NA 142 08/11/2020   K 4.4 08/11/2020   CO2 27 08/11/2020   GLUCOSE 163 (H) 08/11/2020   BUN 12 08/11/2020   CREATININE 0.63 08/11/2020   BILITOT 0.2 08/11/2020   ALKPHOS 110 08/11/2020   AST 11 08/11/2020   ALT 14 08/11/2020   PROT 7.1 08/11/2020   ALBUMIN 5.0 (H) 08/11/2020   CALCIUM 10.3 08/11/2020   ANIONGAP 10 05/15/2019   EGFR 96 08/11/2020   Lab Results  Component Value Date   CHOL 151 08/11/2020   Lab Results  Component Value Date   HDL 61 08/11/2020   Lab Results  Component Value Date   LDLCALC 72 08/11/2020   Lab Results  Component Value Date   TRIG 98 08/11/2020   No results found for: CHOLHDL Lab Results  Component Value Date   HGBA1C 8.0 (A) 06/15/2020       Assessment & Plan:   Problem List Items Addressed This Visit      Cardiovascular and Mediastinum   Essential  hypertension, benign    -BP is well-controlled today -continue current meds        Other   Anxiety state    -she feels like her anxiety has increased today -we will set her up with therapy with LCSW -taking lexapro and xanax      Relevant Orders   Ambulatory referral to Psychiatry   Long-term use of high-risk medication    -chronic back pain -UDS today      Relevant Orders   Drug Screen 12+Alcohol+CRT, Ur       No orders of the defined types were placed in this encounter.    JNoreene Larsson NP

## 2020-09-10 NOTE — Patient Instructions (Signed)
We will get a urine drug screen today.  For the next appointment, please have fasting labs drawn 2-3 days prior to your appointment so we can discuss the results during your office visit.

## 2020-09-11 LAB — COMPREHENSIVE METABOLIC PANEL
ALT: 22 IU/L (ref 0–32)
AST: 22 IU/L (ref 0–40)
Albumin/Globulin Ratio: 2 (ref 1.2–2.2)
Albumin: 4.7 g/dL (ref 3.8–4.8)
Alkaline Phosphatase: 107 IU/L (ref 44–121)
BUN/Creatinine Ratio: 15 (ref 12–28)
BUN: 9 mg/dL (ref 8–27)
Bilirubin Total: 0.3 mg/dL (ref 0.0–1.2)
CO2: 26 mmol/L (ref 20–29)
Calcium: 10.5 mg/dL — ABNORMAL HIGH (ref 8.7–10.3)
Chloride: 100 mmol/L (ref 96–106)
Creatinine, Ser: 0.59 mg/dL (ref 0.57–1.00)
Globulin, Total: 2.3 g/dL (ref 1.5–4.5)
Glucose: 198 mg/dL — ABNORMAL HIGH (ref 65–99)
Potassium: 4.6 mmol/L (ref 3.5–5.2)
Sodium: 141 mmol/L (ref 134–144)
Total Protein: 7 g/dL (ref 6.0–8.5)
eGFR: 97 mL/min/{1.73_m2} (ref >59)

## 2020-09-11 LAB — TSH: TSH: 0.391 u[IU]/mL — ABNORMAL LOW (ref 0.450–4.500)

## 2020-09-11 LAB — T4, FREE: Free T4: 1.13 ng/dL (ref 0.82–1.77)

## 2020-09-14 ENCOUNTER — Other Ambulatory Visit: Payer: Self-pay

## 2020-09-14 ENCOUNTER — Telehealth (INDEPENDENT_AMBULATORY_CARE_PROVIDER_SITE_OTHER): Payer: Medicare Other | Admitting: Licensed Clinical Social Worker

## 2020-09-14 DIAGNOSIS — F411 Generalized anxiety disorder: Secondary | ICD-10-CM

## 2020-09-15 ENCOUNTER — Ambulatory Visit: Payer: Medicare Other | Admitting: "Endocrinology

## 2020-09-20 ENCOUNTER — Ambulatory Visit (INDEPENDENT_AMBULATORY_CARE_PROVIDER_SITE_OTHER): Payer: Medicare Other | Admitting: "Endocrinology

## 2020-09-20 ENCOUNTER — Encounter: Payer: Self-pay | Admitting: "Endocrinology

## 2020-09-20 VITALS — BP 143/76 | HR 87 | Ht 69.0 in | Wt 208.8 lb

## 2020-09-20 DIAGNOSIS — E782 Mixed hyperlipidemia: Secondary | ICD-10-CM

## 2020-09-20 DIAGNOSIS — I10 Essential (primary) hypertension: Secondary | ICD-10-CM

## 2020-09-20 DIAGNOSIS — E1165 Type 2 diabetes mellitus with hyperglycemia: Secondary | ICD-10-CM

## 2020-09-20 LAB — POCT GLYCOSYLATED HEMOGLOBIN (HGB A1C): HbA1c, POC (controlled diabetic range): 8.3 % — AB (ref 0.0–7.0)

## 2020-09-20 MED ORDER — TRULICITY 3 MG/0.5ML ~~LOC~~ SOAJ: 3.0000 mg | SUBCUTANEOUS | 2 refills | Status: DC

## 2020-09-20 NOTE — Patient Instructions (Signed)

## 2020-09-20 NOTE — Progress Notes (Signed)
09/20/2020, 2:19 PM        Endocrinology follow-up note   Subjective:    Patient ID: Melissa Tucker, female    DOB: 04/08/1951.  ARIANE DITULLIO is seen in follow-up for management of currently uncontrolled symptomatic diabetes, hyperlipidemia, hypertension requested by  Noreene Larsson, NP.   Past Medical History:  Diagnosis Date   Anxiety and depression    Chest tightness    DEEP VENOUS THROMBOPHLEBITIS 10/05/2009   Qualifier: History of  By: Lattie Haw, MD, Claud Kelp    Diabetes mellitus 1994   No insulin   DJD (degenerative joint disease)    History of DVT (deep vein thrombosis) 1989   Following childbirth   Hyperlipidemia    Hypertension    IBS (irritable bowel syndrome)    Rotator cuff syndrome of right shoulder 08/22/2011   Past Surgical History:  Procedure Laterality Date   ABDOMINAL HYSTERECTOMY     CESAREAN SECTION  1988   CHOLECYSTECTOMY     COLONOSCOPY  2005   Normal findings   JOINT REPLACEMENT N/A    Phreesia 02/28/2020   ORIF ANKLE FRACTURE Right 05/16/2019   Procedure: OPEN REDUCTION INTERNAL FIXATION (ORIF) ANKLE FRACTURE;  Surgeon: Shona Needles, MD;  Location: [facility];  Service: Orthopedics;  Laterality: Right;   ORIF FEMUR FRACTURE Right 05/16/2019   Procedure: OPEN REDUCTION INTERNAL FIXATION (ORIF) DISTAL FEMUR FRACTURE;  Surgeon: Shona Needles, MD;  Location: [facility];  Service: Orthopedics;  Laterality: Right;   SHOULDER SURGERY     Left shoulder manipulation with subcrominal decompression, capsulitis   TOTAL KNEE ARTHROPLASTY  2003   Left   TOTAL KNEE ARTHROPLASTY  2007   Right   Social History   Socioeconomic History   Marital status: Divorced    Spouse name: Not on file   Number of children: Not on file   Years of education: Not on file   Highest education level: Not on file  Occupational History   Occupation: disabled    Employer: UNEMPLOYED  Tobacco Use   Smoking status: Former    Pack years: 0.00     Types: Cigarettes    Quit date: 04/10/1988    Years since quitting: 32.4   Smokeless tobacco: Former   Tobacco comments:    Minimal prior use  Vaping Use   Vaping Use: Never used  Substance and Sexual Activity   Alcohol use: No   Drug use: No   Sexual activity: Not Currently    Birth control/protection: Surgical    Comment: hyst  Other Topics Concern   Not on file  Social History Narrative   Divorced   Resides with daughter, also has 2 sons   No regular exercise   Social Determinants of Health   Financial Resource Strain: Not on file  Food Insecurity: Not on file  Transportation Needs: Not on file  Physical Activity: Not on file  Stress: Not on file  Social Connections: Not on file   Outpatient Encounter Medications as of 09/20/2020  Medication Sig   Dulaglutide (TRULICITY) 3 ZO/1.0RU SOPN Inject 3 mg as directed once a week.   ALPRAZolam (XANAX) 0.5 MG tablet TAKE 1 TABLET BY MOUTH TWICE DAILY AS NEEDED FOR ANXIETY   amLODipine (NORVASC) 5 MG tablet Take 1 tablet (5 mg total) by mouth daily.   BIOTIN PO Take 1 tablet by mouth daily. (Patient not taking: Reported on 09/20/2020)   bisacodyl (DULCOLAX)  5 MG EC tablet Take 1 tablet (5 mg total) by mouth daily as needed for moderate constipation.   Blood Glucose Monitoring Suppl (ACCU-CHEK GUIDE) w/Device KIT USE TO TEST BLOOD GLUCOSE TWICE DAILY AS DIRECTED.   colesevelam (WELCHOL) 625 MG tablet Take 1,875 mg by mouth 2 (two) times daily as needed (irritable bowel syndrome).   dicyclomine (BENTYL) 10 MG capsule Take 10 mg by mouth daily.   escitalopram (LEXAPRO) 10 MG tablet Take 10 mg by mouth daily.   fluconazole (DIFLUCAN) 150 MG tablet Take 1 now and 1 in 3 days of needed (Patient not taking: Reported on 09/20/2020)   glipiZIDE (GLUCOTROL XL) 10 MG 24 hr tablet TAKE 1 TABLET BY MOUTH ONCE A DAY.   glucose blood (ACCU-CHEK GUIDE) test strip TESTING ONE TIMES DAILY.   HYDROcodone-acetaminophen (NORCO) 10-325 MG tablet TAKE 1  TABLET BY MOUTH FOUR TIMES DAILYAS NEEDED.   lisinopril (ZESTRIL) 10 MG tablet Take 1 tablet (10 mg total) by mouth daily.   metFORMIN (GLUCOPHAGE) 1000 MG tablet TAKE ONE TABLET BY MOUTH TWICE A DAY WITH A MEAL   metoCLOPramide (REGLAN) 5 MG tablet Take 5 mg by mouth 4 (four) times daily as needed for nausea or vomiting.   Multiple Vitamin (MULTIVITAMIN) tablet Take 1 tablet by mouth daily.   simvastatin (ZOCOR) 40 MG tablet Take 1 tablet (40 mg total) by mouth at bedtime.   vitamin B-12 (CYANOCOBALAMIN) 1000 MCG tablet Take 1,000 mcg by mouth daily.   VITAMIN D PO Take 1 tablet by mouth daily.   [DISCONTINUED] TRULICITY 1.5 LP/3.7TK SOPN INJECT 1 PEN SUBCUTANEOUSLY ONCE WEEKLY.   No facility-administered encounter medications on file as of 09/20/2020.    ALLERGIES: Allergies  Allergen Reactions   Peanut-Containing Drug Products Swelling    VACCINATION STATUS: Immunization History  Administered Date(s) Administered   Fluad Quad(high Dose 65+) 03/09/2020   PFIZER(Purple Top)SARS-COV-2 Vaccination 06/18/2019, 07/09/2019   Pneumococcal Conjugate-13 03/09/2020    Diabetes She presents for her follow-up diabetic visit. She has type 2 diabetes mellitus. Onset time: She was diagnosed at approximate age of 70 years. Her disease course has been worsening. There are no hypoglycemic associated symptoms. Pertinent negatives for hypoglycemia include no confusion, headaches, pallor or seizures. Pertinent negatives for diabetes include no blurred vision, no chest pain, no fatigue, no polydipsia, no polyphagia and no polyuria. There are no hypoglycemic complications. Symptoms are worsening. Diabetic complications include retinopathy. Risk factors for coronary artery disease include diabetes mellitus, dyslipidemia, family history, hypertension, obesity, sedentary lifestyle, post-menopausal and tobacco exposure. Current diabetic treatments: She is currently on Invokana 300 mg p.o. daily  metformin/glipizide,\ She is compliant with treatment some of the time. Her weight is decreasing steadily (She lost approximately 10 pounds since last visit.). She is following a generally unhealthy diet. When asked about meal planning, she reported none. She has not had a previous visit with a dietitian. She never participates in exercise. Her home blood glucose trend is decreasing steadily. (She presents without any meter nor logs.  Her point-of-care A1c is 8.3%, increasing from 8% to her last visit.  She denies hypoglycemia.   ) An ACE inhibitor/angiotensin II receptor blocker is being taken. Eye exam is current.  Hyperlipidemia This is a chronic problem. The current episode started more than 1 year ago. The problem is controlled. Exacerbating diseases include diabetes and obesity. Pertinent negatives include no chest pain, myalgias or shortness of breath. Current antihyperlipidemic treatment includes statins and ezetimibe. Risk factors for coronary artery disease include  dyslipidemia, diabetes mellitus, family history, obesity, hypertension, a sedentary lifestyle and post-menopausal.  Hypertension This is a chronic problem. The current episode started more than 1 year ago. The problem is uncontrolled. Pertinent negatives include no blurred vision, chest pain, headaches, palpitations or shortness of breath. Risk factors for coronary artery disease include diabetes mellitus, dyslipidemia, family history, obesity, post-menopausal state, sedentary lifestyle and smoking/tobacco exposure. Past treatments include ACE inhibitors. Hypertensive end-organ damage includes retinopathy.   Review of systems Limited as above.  Objective:    BP (!) 143/76   Pulse 87   Ht $R'5\' 9"'ii$  (1.753 m)   Wt 208 lb 12.8 oz (94.7 kg)   BMI 30.83 kg/m   Wt Readings from Last 3 Encounters:  09/20/20 208 lb 12.8 oz (94.7 kg)  09/10/20 206 lb (93.4 kg)  08/27/20 209 lb (94.8 kg)      Physical Exam- Limited  CMP      Component Value Date/Time   NA 141 09/10/2020 0957   K 4.6 09/10/2020 0957   CL 100 09/10/2020 0957   CO2 26 09/10/2020 0957   GLUCOSE 198 (H) 09/10/2020 0957   GLUCOSE 281 (H) 05/15/2019 1505   BUN 9 09/10/2020 0957   CREATININE 0.59 09/10/2020 0957   CREATININE 0.64 08/14/2018 1142   CALCIUM 10.5 (H) 09/10/2020 0957   PROT 7.0 09/10/2020 0957   ALBUMIN 4.7 09/10/2020 0957   AST 22 09/10/2020 0957   ALT 22 09/10/2020 0957   ALKPHOS 107 09/10/2020 0957   BILITOT 0.3 09/10/2020 0957   GFRNONAA 99 03/09/2020 1400   GFRNONAA 92 08/14/2018 1142   GFRAA 114 03/09/2020 1400   GFRAA 107 08/14/2018 1142     Diabetic Labs (most recent): Lab Results  Component Value Date   HGBA1C 8.3 (A) 09/20/2020   HGBA1C 8.0 (A) 06/15/2020   HGBA1C 9.1 (H) 03/09/2020     Lipid Panel ( most recent) Lipid Panel     Component Value Date/Time   CHOL 151 08/11/2020 1125   TRIG 98 08/11/2020 1125   HDL 61 08/11/2020 1125   LDLCALC 72 08/11/2020 1125     Assessment & Plan:   1. Uncontrolled type 2 diabetes mellitus with hyperglycemia ([place])  - JAYLEE FREEZE has currently uncontrolled symptomatic type 2 DM since 70 years of age.  She presents without any meter nor logs.  Her point-of-care A1c is 8.3%, increasing from 8% to her last visit.  She denies hypoglycemia.    -her diabetes is complicated by retinopathy, obesity/sedentary life, history of smoking and BRIAHNA PESCADOR remains at a high risk for more acute and chronic complications which include CAD, CVA, CKD, retinopathy, and neuropathy. These are all discussed in detail with the patient.  - I have counseled her on diet management and weight loss, by adopting a carbohydrate restricted/protein rich diet.  - she acknowledges that there is a room for improvement in her food and drink choices. - Suggestion is made for her to avoid simple carbohydrates  from her diet including Cakes, Sweet Desserts, Ice Cream, Soda (diet and regular), Sweet  Tea, Candies, Chips, Cookies, Store Bought Juices, Alcohol in Excess of  1-2 drinks a day, Artificial Sweeteners,  Coffee Creamer, and "Sugar-free" Products, Lemonade. This will help patient to have more stable blood glucose profile and potentially avoid unintended weight gain.   - I encouraged her to switch to  unprocessed or minimally processed complex starch and increased protein intake (animal or plant source), fruits, and vegetables.  - she  is advised to stick to a routine mealtimes to eat 3 meals  a day and avoid unnecessary snacks ( to snack only to correct hypoglycemia).   - I have approached her with the following individualized plan to manage diabetes and patient agrees:   -In light of her reported glycemic profile at fasting between 119-170, she will not be initiated on insulin treatment.  She wishes to avoid insulin treatment at this time.    -She has tolerated Trulicity 1.5 mg subcutaneous weekly.  I discussed and increased her Trulicity to 3 mg subcutaneously weekly.  She is advised to continue metformin 1000 mg p.o. twice daily, glipizide 10 mg p.o. daily at breakfast.   -She is encouraged to call clinic for blood glucose readings less than 70 or greater than 200x3.     2) BP/HTN:  -Her blood pressure is not controlled to target.  She is advised to continue her current medications including lisinopril 40 mg p.o. daily, Lasix 40 mg p.o. daily . She will be considered for additional treatment with hydrochlorothiazide on her next visit.   3) Lipids/HPL: Her recent lipid panel showed controlled LDL at 65.  She is advised to continue simvastatin 40 mg p.o. nightly.  Side effects precautions discussed with her.      4)  Weight/Diet: Her BMI is 30.8  .  She is a candidate for modest weight loss.  CDE Consult will be initiated , exercise, and detailed carbohydrates information provided.  5) Chronic Care/Health Maintenance:  -she  is on ACEI and Statin medications and  is encouraged  to initiate and continue to follow up with Ophthalmology, Dentist,  Podiatrist at least yearly or according to recommendations, and advised to  stay away from smoking. I have recommended yearly flu vaccine and pneumonia vaccine at least every 5 years; moderate intensity exercise for up to 150 minutes weekly; and  sleep for at least 7 hours a day.  She did have normal screening ABI on May 11, 2020.  This study will be repeated in February 2027, or sooner if needed.  - I advised patient to maintain close follow up with Noreene Larsson, NP for primary care needs.    I spent 31 minutes in the care of the patient today including review of labs from [facility], Lipids, Thyroid Function, Hematology (current and previous including abstractions from other facilities); face-to-face time discussing  her blood glucose readings/logs, discussing hypoglycemia and hyperglycemia episodes and symptoms, medications doses, her options of short and long term treatment based on the latest standards of care / guidelines;  discussion about incorporating lifestyle medicine;  and documenting the encounter.    Please refer to Patient Instructions for Blood Glucose Monitoring and Insulin/Medications Dosing Guide"  in media tab for additional information. Please  also refer to " Patient Self Inventory" in the Media  tab for reviewed elements of pertinent patient history.  Ulyses Jarred participated in the discussions, expressed understanding, and voiced agreement with the above plans.  All questions were answered to her satisfaction. she is encouraged to contact clinic should she have any questions or concerns prior to her return visit.   Follow up plan: - Return in about 3 months (around 12/21/2020) for Bring Meter and Logs- A1c in Office.  Glade Lloyd, MD [facility] [address] [place], [place] [address] Phone: [phone]  Fax: [phone]    09/20/2020, 2:19  PM  This note was partially dictated with voice recognition software. Similar  sounding words can be transcribed inadequately or may not  be corrected upon review.

## 2020-09-21 NOTE — Progress Notes (Signed)
[place] Initial Clinical Assessment  MRN: [id number] NAME: Melissa Tucker Date: 09/14/20  Start time: 9a End time: 930a Total time: 30  Type of Contact: telephone (Initial Telephone Assessment or Urgent Video Visit) Patient consent obtained: Yes  Referring Provider: Demetrius Revel, NP Reason for Visit today: intitate [facility] services  Treatment History Patient recently received Inpatient Treatment: No    Patient currently being seen by therapist/psychiatrist: No  Patient currently receiving the following services: n/a  Past Psychiatric History/Diagnosis/Hospitalization(s): Anxiety: No Bipolar Disorder: No Depression: No Mania: No Psychosis: No Schizophrenia: No Personality Disorder: No Hospitalization for psychiatric illness: No History of Electroconvulsive Shock Therapy: No Prior Suicide Attempts: No  Decreased need for sleep: Yes  Euphoria: No  Self Injurious behaviors: No  Family History of mental illness: No  Family History of substance abuse: No  Substance Abuse: No  DUI: No  Insomnia: No   History of violence: No  Physical, sexual or emotional abuse: No  Prior outpatient mental health therapy: No   Clinical Assessment  PHQ-9 & GAD-7 Assessments: This is an evidence based assessment tool for depression and anxiety symptoms in adolescents and adults.  Score cut-off points for each section are as follows: 5-9: Mild, 10-14: Moderate, 15+: Severe  PHQ-9 for Depression = 0   GAD-7 for Anxiety = 14   How difficult have these problems made it for you to do your work, take care of things at home, or get along with other people? difficult   Social Functioning Social maturity: WNL Social judgement: WNL  Stress Current stressors: grief/loss, overwhelmed Familial stressors: ex husband recently died "we were close" Sleep: fair Appetite: fair Coping ability: overwhelmed Patient taking medications as prescribed:  yes  Current medications:  Outpatient  Encounter Medications as of 09/14/2020  Medication Sig   ALPRAZolam (XANAX) 0.5 MG tablet TAKE 1 TABLET BY MOUTH TWICE DAILY AS NEEDED FOR ANXIETY   amLODipine (NORVASC) 5 MG tablet Take 1 tablet (5 mg total) by mouth daily.   BIOTIN PO Take 1 tablet by mouth daily. (Patient not taking: Reported on 09/20/2020)   bisacodyl (DULCOLAX) 5 MG EC tablet Take 1 tablet (5 mg total) by mouth daily as needed for moderate constipation.   Blood Glucose Monitoring Suppl (ACCU-CHEK GUIDE) w/Device KIT USE TO TEST BLOOD GLUCOSE TWICE DAILY AS DIRECTED.   colesevelam (WELCHOL) 625 MG tablet Take 1,875 mg by mouth 2 (two) times daily as needed (irritable bowel syndrome).   dicyclomine (BENTYL) 10 MG capsule Take 10 mg by mouth daily.   escitalopram (LEXAPRO) 10 MG tablet Take 10 mg by mouth daily.   fluconazole (DIFLUCAN) 150 MG tablet Take 1 now and 1 in 3 days of needed (Patient not taking: Reported on 09/20/2020)   glipiZIDE (GLUCOTROL XL) 10 MG 24 hr tablet TAKE 1 TABLET BY MOUTH ONCE A DAY.   glucose blood (ACCU-CHEK GUIDE) test strip TESTING ONE TIMES DAILY.   HYDROcodone-acetaminophen (NORCO) 10-325 MG tablet TAKE 1 TABLET BY MOUTH FOUR TIMES DAILYAS NEEDED.   lisinopril (ZESTRIL) 10 MG tablet Take 1 tablet (10 mg total) by mouth daily.   metFORMIN (GLUCOPHAGE) 1000 MG tablet TAKE ONE TABLET BY MOUTH TWICE A DAY WITH A MEAL   metoCLOPramide (REGLAN) 5 MG tablet Take 5 mg by mouth 4 (four) times daily as needed for nausea or vomiting.   Multiple Vitamin (MULTIVITAMIN) tablet Take 1 tablet by mouth daily.   simvastatin (ZOCOR) 40 MG tablet Take 1 tablet (40 mg total) by mouth  at bedtime.   vitamin B-12 (CYANOCOBALAMIN) 1000 MCG tablet Take 1,000 mcg by mouth daily.   VITAMIN D PO Take 1 tablet by mouth daily.   [DISCONTINUED] TRULICITY 1.5 LT/5.3UY SOPN INJECT 1 PEN SUBCUTANEOUSLY ONCE WEEKLY.   No facility-administered encounter medications on file as of 09/14/2020.     Self-harm and/or Suicidal Behaviors  Risk Assessment Self-harm risk factors: no Patient endorses recent self injurious thoughts and/or behaviors: No  Suicide ideations: No plan to harm self or others   Danger to Others Risk Assessment Danger to others risk factors: no Patient endorses recent thoughts of harming others: No    Substance Use Assessment Patient recently consumed alcohol: No  Patient recently used drugs: No  Patient is concerned about dependence or abuse of substances: No    Goals, Interventions and Follow-up Plan Goals: Increase healthy adjustment to current life circumstances and Begin healthy grieving over loss Interventions: Motivational Interviewing and Mindfulness or Relaxation Training Follow-up Plan:  Weekly V[facility] sessions  Summary of Clinical Assessment Summary: Melissa Tucker is a 70 year old woman referred by her PCP at [facility].  Melissa Tucker reports that her "nerves are bad" due to her ex husband's recent death.  She stated that although "we were separated but we were able to become friends and do what we needed to for our children." She reports feeling overwhelmed, on edge and being the sole emotional provider for her adult children.  During the session, she was able to create a priority list to assist her with current duties. She currently consumes Xanax and states that it help her with being able to relax.   Melissa South, LCSW

## 2020-09-22 LAB — DRUG SCREEN 12+ALCOHOL+CRT, UR
Amphetamines, Urine: NEGATIVE ng/mL
BENZODIAZ UR QL: POSITIVE ng/mL — AB
Barbiturate: NEGATIVE ng/mL
Cannabinoids: NEGATIVE
Cocaine (Metabolite): NEGATIVE ng/mL
Creatinine, Urine: 152 mg/dL (ref 20.0–300.0)
Ethanol, Urine: NEGATIVE %
Meperidine: NEGATIVE ng/mL
Methadone: NEGATIVE ng/mL
OPIATE SCREEN URINE: POSITIVE ng/mL — AB
Oxycodone/Oxymorphone, Urine: NEGATIVE ng/mL
Phencyclidine: NEGATIVE ng/mL
Propoxyphene: NEGATIVE ng/mL
Tramadol: NEGATIVE ng/mL

## 2020-09-27 ENCOUNTER — Other Ambulatory Visit: Payer: Self-pay

## 2020-09-27 ENCOUNTER — Ambulatory Visit: Payer: Medicare Other

## 2020-09-27 NOTE — Progress Notes (Deleted)
Subjective:   Melissa Tucker is a 70 y.o. female who presents for an Initial Medicare Annual Wellness Visit.  I connected with Dionne Milo  today by telephone and verified that I am speaking with the correct person using two identifiers. Location patient: home Location provider: work Persons participating in the virtual visit: patient, provider.   I discussed the limitations, risks, security and privacy concerns of performing an evaluation and management service by telephone and the availability of in person appointments. I also discussed with the patient that there may be a patient responsible charge related to this service. The patient expressed understanding and verbally consented to this telephonic visit.    Interactive audio and video telecommunications were attempted between this provider and patient, however failed, due to patient having technical difficulties OR patient did not have access to video capability.  We continued and completed visit with audio only.     Review of Systems    N/A        Objective:    There were no vitals filed for this visit. There is no height or weight on file to calculate BMI.  Advanced Directives 05/15/2019  Does Patient Have a Medical Advance Directive? No  Would patient like information on creating a medical advance directive? No - Patient declined    Current Medications (verified) Outpatient Encounter Medications as of 09/27/2020  Medication Sig   ALPRAZolam (XANAX) 0.5 MG tablet TAKE 1 TABLET BY MOUTH TWICE DAILY AS NEEDED FOR ANXIETY   amLODipine (NORVASC) 5 MG tablet Take 1 tablet (5 mg total) by mouth daily.   BIOTIN PO Take 1 tablet by mouth daily. (Patient not taking: Reported on 09/20/2020)   bisacodyl (DULCOLAX) 5 MG EC tablet Take 1 tablet (5 mg total) by mouth daily as needed for moderate constipation.   Blood Glucose Monitoring Suppl (ACCU-CHEK GUIDE) w/Device KIT USE TO TEST BLOOD GLUCOSE TWICE DAILY AS DIRECTED.   colesevelam  (WELCHOL) 625 MG tablet Take 1,875 mg by mouth 2 (two) times daily as needed (irritable bowel syndrome).   dicyclomine (BENTYL) 10 MG capsule Take 10 mg by mouth daily.   Dulaglutide (TRULICITY) 3 SR/1.5XY SOPN Inject 3 mg as directed once a week.   escitalopram (LEXAPRO) 10 MG tablet Take 10 mg by mouth daily.   fluconazole (DIFLUCAN) 150 MG tablet Take 1 now and 1 in 3 days of needed (Patient not taking: Reported on 09/20/2020)   glipiZIDE (GLUCOTROL XL) 10 MG 24 hr tablet TAKE 1 TABLET BY MOUTH ONCE A DAY.   glucose blood (ACCU-CHEK GUIDE) test strip TESTING ONE TIMES DAILY.   HYDROcodone-acetaminophen (NORCO) 10-325 MG tablet TAKE 1 TABLET BY MOUTH FOUR TIMES DAILYAS NEEDED.   lisinopril (ZESTRIL) 10 MG tablet Take 1 tablet (10 mg total) by mouth daily.   metFORMIN (GLUCOPHAGE) 1000 MG tablet TAKE ONE TABLET BY MOUTH TWICE A DAY WITH A MEAL   metoCLOPramide (REGLAN) 5 MG tablet Take 5 mg by mouth 4 (four) times daily as needed for nausea or vomiting.   Multiple Vitamin (MULTIVITAMIN) tablet Take 1 tablet by mouth daily.   simvastatin (ZOCOR) 40 MG tablet Take 1 tablet (40 mg total) by mouth at bedtime.   vitamin B-12 (CYANOCOBALAMIN) 1000 MCG tablet Take 1,000 mcg by mouth daily.   VITAMIN D PO Take 1 tablet by mouth daily.   No facility-administered encounter medications on file as of 09/27/2020.    Allergies (verified) Peanut-containing drug products   History: Past Medical History:  Diagnosis Date  Anxiety and depression    Chest tightness    DEEP VENOUS THROMBOPHLEBITIS 10/05/2009   Qualifier: History of  By: Lattie Haw, MD, Claud Kelp    Diabetes mellitus 1994   No insulin   DJD (degenerative joint disease)    History of DVT (deep vein thrombosis) 1989   Following childbirth   Hyperlipidemia    Hypertension    IBS (irritable bowel syndrome)    Rotator cuff syndrome of right shoulder 08/22/2011   Past Surgical History:  Procedure Laterality Date   ABDOMINAL  HYSTERECTOMY     CESAREAN SECTION  1988   CHOLECYSTECTOMY     COLONOSCOPY  2005   Normal findings   JOINT REPLACEMENT N/A    [employer] 02/28/2020   ORIF ANKLE FRACTURE Right 05/16/2019   Procedure: OPEN REDUCTION INTERNAL FIXATION (ORIF) ANKLE FRACTURE;  Surgeon: Shona Needles, MD;  Location: [facility];  Service: Orthopedics;  Laterality: Right;   ORIF FEMUR FRACTURE Right 05/16/2019   Procedure: OPEN REDUCTION INTERNAL FIXATION (ORIF) DISTAL FEMUR FRACTURE;  Surgeon: Shona Needles, MD;  Location: [facility];  Service: Orthopedics;  Laterality: Right;   SHOULDER SURGERY     Left shoulder manipulation with subcrominal decompression, capsulitis   TOTAL KNEE ARTHROPLASTY  2003   Left   TOTAL KNEE ARTHROPLASTY  2007   Right   Family History  Problem Relation Age of Onset   Heart disease Mother    Hypertension Mother    Cervical cancer Mother    Esophageal cancer Father    Hypertension Sister    Social History   Socioeconomic History   Marital status: Divorced    Spouse name: Not on file   Number of children: Not on file   Years of education: Not on file   Highest education level: Not on file  Occupational History   Occupation: disabled    Employer: UNEMPLOYED  Tobacco Use   Smoking status: Former    Pack years: 0.00    Types: Cigarettes    Quit date: 04/10/1988    Years since quitting: 32.4   Smokeless tobacco: Former   Tobacco comments:    Minimal prior use  Vaping Use   Vaping Use: Never used  Substance and Sexual Activity   Alcohol use: No   Drug use: No   Sexual activity: Not Currently    Birth control/protection: Surgical    Comment: hyst  Other Topics Concern   Not on file  Social History Narrative   Divorced   Resides with daughter, also has 2 sons   No regular exercise   Social Determinants of Health   Financial Resource Strain: Not on file  Food Insecurity: Not on file  Transportation Needs: Not on file  Physical Activity: Not on file  Stress: Not on file   Social Connections: Not on file    Tobacco Counseling Counseling given: Not Answered Tobacco comments: Minimal prior use   Clinical Intake:                 Diabetic?Yes Nutrition Risk Assessment:  Has the patient had any N/V/D within the last 2 months?  {YES/NO:21197} Does the patient have any non-healing wounds?  {YES/NO:21197} Has the patient had any unintentional weight loss or weight gain?  {YES/NO:21197}  Diabetes:  Is the patient diabetic?  Yes  If diabetic, was a CBG obtained today?  No  Did the patient bring in their glucometer from home?  No  How often do you monitor your CBG's? ***.  Financial Strains and Diabetes Management:  Are you having any financial strains with the device, your supplies or your medication? {YES/NO:21197}.  Does the patient want to be seen by Chronic Care Management for management of their diabetes?  {YES/NO:21197} Would the patient like to be referred to a Nutritionist or for Diabetic Management?  {YES/NO:21197}  Diabetic Exams:  Diabetic Eye Exam: Overdue for diabetic eye exam. Pt has been advised about the importance in completing this exam. Patient advised to call and schedule an eye exam. Diabetic Foot Exam: Overdue, Pt has been advised about the importance in completing this exam. Pt is scheduled for diabetic foot exam on ***.          Activities of Daily Living No flowsheet data found.  Patient Care Team: Noreene Larsson, NP as PCP - General (Nurse Practitioner)  Indicate any recent Medical Services you may have received from other than [facility] providers in the past year (date may be approximate).     Assessment:   This is a routine wellness examination for Melissa Tucker.  Hearing/Vision screen No results found.  Dietary issues and exercise activities discussed:     Goals Addressed   None    Depression Screen PHQ 2/9 Scores 09/10/2020 08/11/2020 03/16/2020 03/09/2020 01/14/2018 11/13/2017  PHQ - 2 Score '2 3 1 1 ' 0 0   PHQ- 9 Score 5 8 - - - -    Fall Risk Fall Risk  09/10/2020 08/11/2020 03/16/2020 03/09/2020 02/10/2019  Falls in the past year? 0 0 0 1 0  Number falls in past yr: 0 0 0 1 -  Injury with Fall? 0 0 0 1 -  Risk for fall due to : No Fall Risks No Fall Risks No Fall Risks Impaired balance/gait -  Follow up Falls evaluation completed Falls evaluation completed Falls evaluation completed Falls evaluation completed Falls evaluation completed    FALL RISK PREVENTION PERTAINING TO THE HOME:  Any stairs in or around the home? {YES/NO:21197} If so, are there any without handrails? No  Home free of loose throw rugs in walkways, pet beds, electrical cords, etc? Yes  Adequate lighting in your home to reduce risk of falls? Yes   ASSISTIVE DEVICES UTILIZED TO PREVENT FALLS:  Life alert? {YES/NO:21197} Use of a cane, walker or w/c? {YES/NO:21197} Grab bars in the bathroom? {YES/NO:21197} Shower chair or bench in shower? {YES/NO:21197} Elevated toilet seat or a handicapped toilet? {YES/NO:21197}   Cognitive Function:        Immunizations Immunization History  Administered Date(s) Administered   Fluad Quad(high Dose 65+) 03/09/2020   PFIZER(Purple Top)SARS-COV-2 Vaccination 06/18/2019, 07/09/2019   Pneumococcal Conjugate-13 03/09/2020    TDAP status: Due, Education has been provided regarding the importance of this vaccine. Advised may receive this vaccine at local pharmacy or Health Dept. Aware to provide a copy of the vaccination record if obtained from local pharmacy or Health Dept. Verbalized acceptance and understanding.  Flu Vaccine status: Up to date  Pneumococcal vaccine status: Up to date  Covid-19 vaccine status: Completed vaccines  Qualifies for Shingles Vaccine? Yes   Zostavax completed No   Shingrix Completed?: No.    Education has been provided regarding the importance of this vaccine. Patient has been advised to call insurance company to determine out of pocket expense if  they have not yet received this vaccine. Advised may also receive vaccine at local pharmacy or Health Dept. Verbalized acceptance and understanding.  Screening Tests Health Maintenance  Topic Date Due   FOOT EXAM  Never done   OPHTHALMOLOGY EXAM  Never done   COLONOSCOPY (Pts 45-22yr Insurance coverage will need to be confirmed)  Never done   DEXA SCAN  Never done   Zoster Vaccines- Shingrix (1 of 2) 12/11/2020 (Originally 07/27/1969)   COVID-19 Vaccine (3 - Pfizer risk series) 01/31/2021 (Originally 08/06/2019)   TETANUS/TDAP  09/10/2021 (Originally 07/27/1969)   INFLUENZA VACCINE  11/08/2020   PNA vac Low Risk Adult (2 of 2 - PPSV23) 03/09/2021   HEMOGLOBIN A1C  03/22/2021   MAMMOGRAM  05/05/2022   Hepatitis C Screening  Completed   HPV VACCINES  Aged Out    Health Maintenance  Health Maintenance Due  Topic Date Due   FOOT EXAM  Never done   OPHTHALMOLOGY EXAM  Never done   COLONOSCOPY (Pts 45-424yrInsurance coverage will need to be confirmed)  Never done   DEXA SCAN  Never done    {Colorectal cancer screening:2101809}  Mammogram status: Completed 05/05/2020. Repeat every year  Bone Density status: Ordered 09/27/2020. Pt provided with contact info and advised to call to schedule appt.  Lung Cancer Screening: (Low Dose CT Chest recommended if Age 69-80ears, 30 pack-year currently smoking OR have quit w/in 15years.) does not qualify.   Lung Cancer Screening Referral: N/a   Additional Screening:  Hepatitis C Screening: does qualify; Completed 03/09/2020  Vision Screening: Recommended annual ophthalmology exams for early detection of glaucoma and other disorders of the eye. Is the patient up to date with their annual eye exam?  {YES/NO:21197} Who is the provider or what is the name of the office in which the patient attends annual eye exams? *** If pt is not established with a provider, would they like to be referred to a provider to establish care? {YES/NO:21197}.    Dental Screening: Recommended annual dental exams for proper oral hygiene  Community Resource Referral / Chronic Care Management: CRR required this visit?  No   CCM required this visit?  No      Plan:     I have personally reviewed and noted the following in the patient's chart:   Medical and social history Use of alcohol, tobacco or illicit drugs  Current medications and supplements including opioid prescriptions. Patient is not currently taking opioid prescriptions. Functional ability and status Nutritional status Physical activity Advanced directives List of other physicians Hospitalizations, surgeries, and ER visits in previous 12 months Vitals Screenings to include cognitive, depression, and falls Referrals and appointments  In addition, I have reviewed and discussed with patient certain preventive protocols, quality metrics, and best practice recommendations. A written personalized care plan for preventive services as well as general preventive health recommendations were provided to patient.     ShOfilia NeasLPN   09/12/54/2563 Nurse Notes: None

## 2020-09-29 ENCOUNTER — Encounter (INDEPENDENT_AMBULATORY_CARE_PROVIDER_SITE_OTHER): Payer: Self-pay

## 2020-09-29 ENCOUNTER — Other Ambulatory Visit (INDEPENDENT_AMBULATORY_CARE_PROVIDER_SITE_OTHER): Payer: Self-pay

## 2020-09-29 ENCOUNTER — Telehealth (INDEPENDENT_AMBULATORY_CARE_PROVIDER_SITE_OTHER): Payer: Self-pay

## 2020-09-29 DIAGNOSIS — Z1211 Encounter for screening for malignant neoplasm of colon: Secondary | ICD-10-CM

## 2020-09-29 MED ORDER — PEG 3350-KCL-NA BICARB-NACL 420 G PO SOLR: 4000.0000 mL | ORAL | 0 refills | Status: DC

## 2020-09-29 MED ORDER — MAGNESIUM CITRATE PO SOLN: 1.0000 | Freq: Once | ORAL | 0 refills | Status: AC

## 2020-09-29 NOTE — Telephone Encounter (Signed)
Referring MD/PCP: Wallace Cullens  Procedure: Tcs  Reason/Indication:  Screening  Has patient had this procedure before?  yes  If so, when, by whom and where?  15 years ago  Is there a family history of colon cancer?  no  Who?  What age when diagnosed?    Is patient diabetic? If yes, Type 1 or Type 2   yes, Type 2      Does patient have prosthetic heart valve or mechanical valve?  no  Do you have a pacemaker/defibrillator?  no  Has patient ever had endocarditis/atrial fibrillation? no  Does patient use oxygen? no  Has patient had joint replacement within last 12 months?  Yes   Is patient constipated or do they take laxatives? no  Does patient have a history of alcohol/drug use?  no  Have you had a stroke/heart attack last 6 mths? no  Do you take medicine for weight loss?  no  For female patients,: do you still have your menstrual cycle? no  Is patient on blood thinner such as Coumadin, Plavix and/or Aspirin? yes  Medications: asa daily, hydrocodone 10/325 mg 4 daily, alprazolam 0.5 bid prn , lisinopril 10 mg daily, metformin 1000 mg bid, amlodipine 5 mg daily, glipizide 10 mg daily, Lexapro 10 mg daily, simvastatin 40 mg daily  Allergies: Peanut-containing drug products  Medication Adjustment per/Dr Levon Hedger Hold Metformin and Glipizide the evening prior to procedure and morning of   Procedure date & time: 11/03/20 8:30

## 2020-09-29 NOTE — Telephone Encounter (Signed)
Ok to schedule.  Thanks,  Derrion Tritz Castaneda Mayorga, MD Gastroenterology and Hepatology [facility]  

## 2020-09-29 NOTE — Telephone Encounter (Signed)
Melissa Tucker, CMA  

## 2020-09-30 ENCOUNTER — Other Ambulatory Visit: Payer: Self-pay | Admitting: Nurse Practitioner

## 2020-10-04 ENCOUNTER — Other Ambulatory Visit: Payer: Self-pay

## 2020-10-04 ENCOUNTER — Ambulatory Visit (INDEPENDENT_AMBULATORY_CARE_PROVIDER_SITE_OTHER): Payer: Medicare Other

## 2020-10-04 DIAGNOSIS — Z0001 Encounter for general adult medical examination with abnormal findings: Secondary | ICD-10-CM | POA: Diagnosis not present

## 2020-10-04 DIAGNOSIS — Z0000 Encounter for general adult medical examination without abnormal findings: Secondary | ICD-10-CM

## 2020-10-04 DIAGNOSIS — Z780 Asymptomatic menopausal state: Secondary | ICD-10-CM | POA: Diagnosis not present

## 2020-10-04 NOTE — Progress Notes (Signed)
Subjective:   Melissa Tucker is a 70 y.o. female who presents for an Initial Medicare Annual Wellness Visit.  I connected with Dionne Milo  today by telephone and verified that I am speaking with the correct person using two identifiers. Location patient: home Location provider: work Persons participating in the virtual visit: patient, provider.   I discussed the limitations, risks, security and privacy concerns of performing an evaluation and management service by telephone and the availability of in person appointments. I also discussed with the patient that there may be a patient responsible charge related to this service. The patient expressed understanding and verbally consented to this telephonic visit.    Interactive audio and video telecommunications were attempted between this provider and patient, however failed, due to patient having technical difficulties OR patient did not have access to video capability.  We continued and completed visit with audio only.     Review of Systems    N/A  Cardiac Risk Factors include: advanced age (>22mn, >>10women);hypertension;diabetes mellitus;dyslipidemia     Objective:    Today's Vitals   10/04/20 1540  PainSc: 0-No pain   There is no height or weight on file to calculate BMI.  Advanced Directives 10/04/2020 05/15/2019  Does Patient Have a Medical Advance Directive? No No  Would patient like information on creating a medical advance directive? No - Patient declined No - Patient declined    Current Medications (verified) Outpatient Encounter Medications as of 10/04/2020  Medication Sig   ALPRAZolam (XANAX) 0.5 MG tablet TAKE 1 TABLET BY MOUTH TWICE DAILY AS NEEDED FOR ANXIETY   amLODipine (NORVASC) 5 MG tablet Take 1 tablet (5 mg total) by mouth daily.   bisacodyl (DULCOLAX) 5 MG EC tablet Take 1 tablet (5 mg total) by mouth daily as needed for moderate constipation.   Blood Glucose Monitoring Suppl (ACCU-CHEK GUIDE) w/Device KIT  USE TO TEST BLOOD GLUCOSE TWICE DAILY AS DIRECTED.   colesevelam (WELCHOL) 625 MG tablet Take 1,875 mg by mouth 2 (two) times daily as needed (irritable bowel syndrome).   dicyclomine (BENTYL) 10 MG capsule Take 10 mg by mouth daily.   Dulaglutide (TRULICITY) 3 MPY/1.9JKSOPN Inject 3 mg as directed once a week.   escitalopram (LEXAPRO) 10 MG tablet Take 10 mg by mouth daily.   glipiZIDE (GLUCOTROL XL) 10 MG 24 hr tablet TAKE 1 TABLET BY MOUTH ONCE A DAY.   glucose blood (ACCU-CHEK GUIDE) test strip TESTING ONE TIMES DAILY.   HYDROcodone-acetaminophen (NORCO) 10-325 MG tablet TAKE 1 TABLET BY MOUTH FOUR TIMES DAILY AS NEEDED.   lisinopril (ZESTRIL) 10 MG tablet Take 1 tablet (10 mg total) by mouth daily.   metFORMIN (GLUCOPHAGE) 1000 MG tablet TAKE ONE TABLET BY MOUTH TWICE A DAY WITH A MEAL   metoCLOPramide (REGLAN) 5 MG tablet Take 5 mg by mouth 4 (four) times daily as needed for nausea or vomiting.   Multiple Vitamin (MULTIVITAMIN) tablet Take 1 tablet by mouth daily.   simvastatin (ZOCOR) 40 MG tablet TAKE 1 TABLET BY MOUTH ONCE DAILY.   vitamin B-12 (CYANOCOBALAMIN) 1000 MCG tablet Take 1,000 mcg by mouth daily.   VITAMIN D PO Take 1 tablet by mouth daily.   polyethylene glycol-electrolytes (TRILYTE) 420 g solution Take 4,000 mLs by mouth as directed. (Patient not taking: Reported on 10/04/2020)   [DISCONTINUED] BIOTIN PO Take 1 tablet by mouth daily. (Patient not taking: Reported on 09/20/2020)   No facility-administered encounter medications on file as of 10/04/2020.  Allergies (verified) Peanut-containing drug products   History: Past Medical History:  Diagnosis Date   Anxiety and depression    Chest tightness    DEEP VENOUS THROMBOPHLEBITIS 10/05/2009   Qualifier: History of  By: Lattie Haw, MD, Claud Kelp    Diabetes mellitus 1994   No insulin   DJD (degenerative joint disease)    History of DVT (deep vein thrombosis) 1989   Following childbirth   Hyperlipidemia     Hypertension    IBS (irritable bowel syndrome)    Rotator cuff syndrome of right shoulder 08/22/2011   Past Surgical History:  Procedure Laterality Date   ABDOMINAL HYSTERECTOMY     CESAREAN SECTION  1988   CHOLECYSTECTOMY     COLONOSCOPY  2005   Normal findings   JOINT REPLACEMENT N/A    Phreesia 02/28/2020   ORIF ANKLE FRACTURE Right 05/16/2019   Procedure: OPEN REDUCTION INTERNAL FIXATION (ORIF) ANKLE FRACTURE;  Surgeon: Shona Needles, MD;  Location: [facility];  Service: Orthopedics;  Laterality: Right;   ORIF FEMUR FRACTURE Right 05/16/2019   Procedure: OPEN REDUCTION INTERNAL FIXATION (ORIF) DISTAL FEMUR FRACTURE;  Surgeon: Shona Needles, MD;  Location: [facility];  Service: Orthopedics;  Laterality: Right;   SHOULDER SURGERY     Left shoulder manipulation with subcrominal decompression, capsulitis   TOTAL KNEE ARTHROPLASTY  2003   Left   TOTAL KNEE ARTHROPLASTY  2007   Right   Family History  Problem Relation Age of Onset   Heart disease Mother    Hypertension Mother    Cervical cancer Mother    Esophageal cancer Father    Hypertension Sister    Social History   Socioeconomic History   Marital status: Divorced    Spouse name: Not on file   Number of children: Not on file   Years of education: Not on file   Highest education level: Not on file  Occupational History   Occupation: disabled    Employer: UNEMPLOYED  Tobacco Use   Smoking status: Former    Pack years: 0.00    Types: Cigarettes    Quit date: 04/10/1988    Years since quitting: 32.5   Smokeless tobacco: Former   Tobacco comments:    Minimal prior use  Vaping Use   Vaping Use: Never used  Substance and Sexual Activity   Alcohol use: No   Drug use: No   Sexual activity: Not Currently    Birth control/protection: Surgical    Comment: hyst  Other Topics Concern   Not on file  Social History Narrative   Divorced   Resides with daughter, also has 2 sons   No regular exercise   Social Determinants of  Health   Financial Resource Strain: Low Risk    Difficulty of Paying Living Expenses: Not hard at all  Food Insecurity: No Food Insecurity   Worried About Charity fundraiser in the Last Year: Never true   Park in the Last Year: Never true  Transportation Needs: No Transportation Needs   Lack of Transportation (Medical): No   Lack of Transportation (Non-Medical): No  Physical Activity: Inactive   Days of Exercise per Week: 0 days   Minutes of Exercise per Session: 0 min  Stress: No Stress Concern Present   Feeling of Stress : Not at all  Social Connections: Moderately Isolated   Frequency of Communication with Friends and Family: More than three times a week   Frequency of Social Gatherings with  Friends and Family: More than three times a week   Attends Religious Services: 1 to 4 times per year   Active Member of [employer] or Organizations: No   Attends Music therapist: Never   Marital Status: Divorced    Tobacco Counseling Counseling given: Not Answered Tobacco comments: Minimal prior use   Clinical Intake:  Pre-visit preparation completed: Yes  Pain : No/denies pain Pain Score: 0-No pain     Nutritional Risks: Nausea/ vomitting/ diarrhea (occassional diarrhea due to IBS) Diabetes: Yes CBG done?: No Did pt. bring in CBG monitor from home?: No  How often do you need to have someone help you when you read instructions, pamphlets, or other written materials from your doctor or pharmacy?: 1 - Never  Diabetic?Yes Nutrition Risk Assessment:  Has the patient had any N/V/D within the last 2 months?  Yes  Does the patient have any non-healing wounds?  No  Has the patient had any unintentional weight loss or weight gain?  No   Diabetes:  Is the patient diabetic?  Yes  If diabetic, was a CBG obtained today?  No  Did the patient bring in their glucometer from home?  No  How often do you monitor your CBG's? Patient states checks glucose twice per day.    Financial Strains and Diabetes Management:  Are you having any financial strains with the device, your supplies or your medication? No .  Does the patient want to be seen by Chronic Care Management for management of their diabetes?  No  Would the patient like to be referred to a Nutritionist or for Diabetic Management?  No   Diabetic Exams:  Diabetic Eye Exam: Overdue for diabetic eye exam. Pt has been advised about the importance in completing this exam. Patient advised to call and schedule an eye exam. Diabetic Foot Exam: Overdue, Pt has been advised about the importance in completing this exam. Pt is scheduled for diabetic foot exam on 12/22/2020.   Interpreter Needed?: No  Information entered by :: [facility] Daily [facility] In your present state of health, do you have any difficulty performing the following activities: 10/04/2020  Hearing? N  Vision? N  Difficulty concentrating or making decisions? N  Walking or climbing stairs? N  Dressing or bathing? N  Doing errands, shopping? N  Preparing Food and eating ? N  Using the Toilet? N  In the past six months, have you accidently leaked urine? N  Do you have problems with loss of bowel control? N  Managing your Medications? N  Managing your Finances? N  Housekeeping or managing your Housekeeping? N  Some recent data might be hidden    Patient Care Team: Noreene Larsson, NP as PCP - General (Nurse Practitioner)  Indicate any recent Medical Services you may have received from other than [facility] providers in the past year (date may be approximate).     Assessment:   This is a routine wellness examination for [place].  Hearing/Vision screen Vision Screening - Comments:: Patient stated she gets eyes examined twice per year. Wears reading glasses   Dietary issues and exercise activities discussed: Current Exercise Habits: The patient does not participate in regular exercise at present, Exercise limited by: None  identified   Goals Addressed             This Visit's Progress    HEMOGLOBIN A1C < 7         Depression Screen [facility] 2/9 Scores 10/04/2020 09/10/2020  08/11/2020 03/16/2020 03/09/2020 01/14/2018 11/13/2017  PHQ - 2 Score '4 2 3 1 1 ' 0 0  PHQ- 9 Score '6 5 8 ' - - - -    Fall Risk Fall Risk  10/04/2020 09/10/2020 08/11/2020 03/16/2020 03/09/2020  Falls in the past year? 0 0 0 0 1  Number falls in past yr: 0 0 0 0 1  Injury with Fall? 0 0 0 0 1  Risk for fall due to : No Fall Risks No Fall Risks No Fall Risks No Fall Risks Impaired balance/gait  Follow up Falls evaluation completed;Falls prevention discussed Falls evaluation completed Falls evaluation completed Falls evaluation completed Falls evaluation completed    FALL RISK PREVENTION PERTAINING TO THE HOME:  Any stairs in or around the home? Yes  If so, are there any without handrails? No  Home free of loose throw rugs in walkways, pet beds, electrical cords, etc? Yes  Adequate lighting in your home to reduce risk of falls? Yes   ASSISTIVE DEVICES UTILIZED TO PREVENT FALLS:  Life alert? No  Use of a cane, walker or w/c? No  Grab bars in the bathroom? No  Shower chair or bench in shower? No  Elevated toilet seat or a handicapped toilet? No    Cognitive Function:  Normal cognitive status assessed by direct observation by this Nurse Health Advisor. No abnormalities found.        Immunizations Immunization History  Administered Date(s) Administered   Fluad Quad(high Dose 65+) 03/09/2020   PFIZER(Purple Top)SARS-COV-2 Vaccination 06/18/2019, 07/09/2019   Pneumococcal Conjugate-13 03/09/2020    TDAP status: Due, Education has been provided regarding the importance of this vaccine. Advised may receive this vaccine at local pharmacy or Health Dept. Aware to provide a copy of the vaccination record if obtained from local pharmacy or Health Dept. Verbalized acceptance and understanding.  Flu Vaccine status: Up to date  Pneumococcal  vaccine status: Up to date  Covid-19 vaccine status: Completed vaccines  Qualifies for Shingles Vaccine? Yes   Zostavax completed No   Shingrix Completed?: No.    Education has been provided regarding the importance of this vaccine. Patient has been advised to call insurance company to determine out of pocket expense if they have not yet received this vaccine. Advised may also receive vaccine at local pharmacy or Health Dept. Verbalized acceptance and understanding.  Screening Tests Health Maintenance  Topic Date Due   FOOT EXAM  Never done   OPHTHALMOLOGY EXAM  Never done   COLONOSCOPY (Pts 45-19yr Insurance coverage will need to be confirmed)  Never done   DEXA SCAN  Never done   Zoster Vaccines- Shingrix (1 of 2) 12/11/2020 (Originally 07/27/1969)   COVID-19 Vaccine (3 - Pfizer risk series) 01/31/2021 (Originally 08/06/2019)   TETANUS/TDAP  09/10/2021 (Originally 07/27/1969)   INFLUENZA VACCINE  11/08/2020   PNA vac Low Risk Adult (2 of 2 - PPSV23) 03/09/2021   HEMOGLOBIN A1C  03/22/2021   MAMMOGRAM  05/05/2021   Hepatitis C Screening  Completed   HPV VACCINES  Aged Out    Health Maintenance  Health Maintenance Due  Topic Date Due   FOOT EXAM  Never done   OPHTHALMOLOGY EXAM  Never done   COLONOSCOPY (Pts 45-486yrInsurance coverage will need to be confirmed)  Never done   DEXA SCAN  Never done    Colorectal Cancer Screening: Colonoscopy is scheduled for 11/03/2020  Mammogram status: Completed 05/05/2020. Repeat every year  Bone Density status: Ordered 10/04/2020. Pt provided with contact info  and advised to call to schedule appt.  Lung Cancer Screening: (Low Dose CT Chest recommended if Age 63-80 years, 30 pack-year currently smoking OR have quit w/in 15years.) does not qualify.   Lung Cancer Screening Referral: N/A   Additional Screening:  Hepatitis C Screening: does qualify; Completed 03/09/2020  Vision Screening: Recommended annual ophthalmology exams for early  detection of glaucoma and other disorders of the eye. Is the patient up to date with their annual eye exam?  Yes  Who is the provider or what is the name of the office in which the patient attends annual eye exams? Dr. Gershon Crane  If pt is not established with a provider, would they like to be referred to a provider to establish care? No .   Dental Screening: Recommended annual dental exams for proper oral hygiene  Community Resource Referral / Chronic Care Management: CRR required this visit?  No   CCM required this visit?  No      Plan:     I have personally reviewed and noted the following in the patient's chart:   Medical and social history Use of alcohol, tobacco or illicit drugs  Current medications and supplements including opioid prescriptions. Patient is not currently taking opioid prescriptions. Functional ability and status Nutritional status Physical activity Advanced directives List of other physicians Hospitalizations, surgeries, and ER visits in previous 12 months Vitals Screenings to include cognitive, depression, and falls Referrals and appointments  In addition, I have reviewed and discussed with patient certain preventive protocols, quality metrics, and best practice recommendations. A written personalized care plan for preventive services as well as general preventive health recommendations were provided to patient.     Ofilia Neas, LPN   08/25/6158   Nurse Notes: None

## 2020-10-04 NOTE — Patient Instructions (Signed)
[name] , Thank you for taking time to come for your Medicare Wellness Visit. I appreciate your ongoing commitment to your health goals. Please review the following plan we discussed and let me know if I can assist you in the future.   Screening recommendations/referrals: Colonoscopy: Currently due, please keep scheduled colonoscopy for 11/03/2020 Mammogram: Up to date, next due 05/05/2021 Bone Density: Currently due, orders placed this visit  Recommended yearly ophthalmology/optometry visit for glaucoma screening and checkup Recommended yearly dental visit for hygiene and checkup  Vaccinations: Influenza vaccine: Up to date, next due fall 2022  Pneumococcal vaccine: Up to date, next due 03/02/2022 Tdap vaccine: Currently due, you may await injury to receive  Shingles vaccine: Currently due for Shingrix, if you would like to receive we recommend that you do so at your local pharmacy.     Advanced directives: Advance directive discussed with you today. Even though you declined this today please call our office should you change your mind and we can give you the proper paperwork for you to fill out.   Conditions/risks identified: None   Next appointment: 11/11/2020 @ 10:40 am with Bjorn Pippin, NP    Preventive Care 69 Years and Older, Female Preventive care refers to lifestyle choices and visits with your health care provider that can promote health and wellness. What does preventive care include? A yearly physical exam. This is also called an annual well check. Dental exams once or twice a year. Routine eye exams. Ask your health care provider how often you should have your eyes checked. Personal lifestyle choices, including: Daily care of your teeth and gums. Regular physical activity. Eating a healthy diet. Avoiding tobacco and drug use. Limiting alcohol use. Practicing safe sex. Taking low-dose aspirin every day. Taking vitamin and mineral supplements as recommended by your  health care provider. What happens during an annual well check? The services and screenings done by your health care provider during your annual well check will depend on your age, overall health, lifestyle risk factors, and family history of disease. Counseling  Your health care provider may ask you questions about your: Alcohol use. Tobacco use. Drug use. Emotional well-being. Home and relationship well-being. Sexual activity. Eating habits. History of falls. Memory and ability to understand (cognition). Work and work Astronomer. Reproductive health. Screening  You may have the following tests or measurements: Height, weight, and BMI. Blood pressure. Lipid and cholesterol levels. These may be checked every 5 years, or more frequently if you are over 28 years old. Skin check. Lung cancer screening. You may have this screening every year starting at age 56 if you have a 30-pack-year history of smoking and currently smoke or have quit within the past 15 years. Fecal occult blood test (FOBT) of the stool. You may have this test every year starting at age 75. Flexible sigmoidoscopy or colonoscopy. You may have a sigmoidoscopy every 5 years or a colonoscopy every 10 years starting at age 28. Hepatitis C blood test. Hepatitis B blood test. Sexually transmitted disease (STD) testing. Diabetes screening. This is done by checking your blood sugar (glucose) after you have not eaten for a while (fasting). You may have this done every 1-3 years. Bone density scan. This is done to screen for osteoporosis. You may have this done starting at age 67. Mammogram. This may be done every 1-2 years. Talk to your health care provider about how often you should have regular mammograms. Talk with your health care provider about your test results,  treatment options, and if necessary, the need for more tests. Vaccines  Your health care provider may recommend certain vaccines, such as: Influenza vaccine.  This is recommended every year. Tetanus, diphtheria, and acellular pertussis (Tdap, Td) vaccine. You may need a Td booster every 10 years. Zoster vaccine. You may need this after age 92. Pneumococcal 13-valent conjugate (PCV13) vaccine. One dose is recommended after age 37. Pneumococcal polysaccharide (PPSV23) vaccine. One dose is recommended after age 37. Talk to your health care provider about which screenings and vaccines you need and how often you need them. This information is not intended to replace advice given to you by your health care provider. Make sure you discuss any questions you have with your health care provider. Document Released: 04/23/2015 Document Revised: 12/15/2015 Document Reviewed: 01/26/2015 Elsevier Interactive Patient Education  2017 [place] Prevention in the Home Falls can cause injuries. They can happen to people of all ages. There are many things you can do to make your home safe and to help prevent falls. What can I do on the outside of my home? Regularly fix the edges of walkways and driveways and fix any cracks. Remove anything that might make you trip as you walk through a door, such as a raised step or threshold. Trim any bushes or trees on the path to your home. Use bright outdoor lighting. Clear any walking paths of anything that might make someone trip, such as rocks or tools. Regularly check to see if handrails are loose or broken. Make sure that both sides of any steps have handrails. Any raised decks and porches should have guardrails on the edges. Have any leaves, snow, or ice cleared regularly. Use sand or salt on walking paths during winter. Clean up any spills in your garage right away. This includes oil or grease spills. What can I do in the bathroom? Use night lights. Install grab bars by the toilet and in the tub and shower. Do not use towel bars as grab bars. Use non-skid mats or decals in the tub or shower. If you need to sit  down in the shower, use a plastic, non-slip stool. Keep the floor dry. Clean up any water that spills on the floor as soon as it happens. Remove soap buildup in the tub or shower regularly. Attach bath mats securely with double-sided non-slip rug tape. Do not have throw rugs and other things on the floor that can make you trip. What can I do in the bedroom? Use night lights. Make sure that you have a light by your bed that is easy to reach. Do not use any sheets or blankets that are too big for your bed. They should not hang down onto the floor. Have a firm chair that has side arms. You can use this for support while you get dressed. Do not have throw rugs and other things on the floor that can make you trip. What can I do in the kitchen? Clean up any spills right away. Avoid walking on wet floors. Keep items that you use a lot in easy-to-reach places. If you need to reach something above you, use a strong step stool that has a grab bar. Keep electrical cords out of the way. Do not use floor polish or wax that makes floors slippery. If you must use wax, use non-skid floor wax. Do not have throw rugs and other things on the floor that can make you trip. What can I do with my stairs? Do  not leave any items on the stairs. Make sure that there are handrails on both sides of the stairs and use them. Fix handrails that are broken or loose. Make sure that handrails are as long as the stairways. Check any carpeting to make sure that it is firmly attached to the stairs. Fix any carpet that is loose or worn. Avoid having throw rugs at the top or bottom of the stairs. If you do have throw rugs, attach them to the floor with carpet tape. Make sure that you have a light switch at the top of the stairs and the bottom of the stairs. If you do not have them, ask someone to add them for you. What else can I do to help prevent falls? Wear shoes that: Do not have high heels. Have rubber bottoms. Are  comfortable and fit you well. Are closed at the toe. Do not wear sandals. If you use a stepladder: Make sure that it is fully opened. Do not climb a closed stepladder. Make sure that both sides of the stepladder are locked into place. Ask someone to hold it for you, if possible. Clearly mark and make sure that you can see: Any grab bars or handrails. First and last steps. Where the edge of each step is. Use tools that help you move around (mobility aids) if they are needed. These include: Canes. Walkers. Scooters. Crutches. Turn on the lights when you go into a dark area. Replace any light bulbs as soon as they burn out. Set up your furniture so you have a clear path. Avoid moving your furniture around. If any of your floors are uneven, fix them. If there are any pets around you, be aware of where they are. Review your medicines with your doctor. Some medicines can make you feel dizzy. This can increase your chance of falling. Ask your doctor what other things that you can do to help prevent falls. This information is not intended to replace advice given to you by your health care provider. Make sure you discuss any questions you have with your health care provider. Document Released: 01/21/2009 Document Revised: 09/02/2015 Document Reviewed: 05/01/2014 Elsevier Interactive Patient Education  2017 [employer].

## 2020-10-05 ENCOUNTER — Other Ambulatory Visit: Payer: Self-pay | Admitting: Nurse Practitioner

## 2020-10-07 ENCOUNTER — Other Ambulatory Visit: Payer: Self-pay | Admitting: "Endocrinology

## 2020-10-13 ENCOUNTER — Telehealth: Payer: Self-pay | Admitting: Licensed Clinical Social Worker

## 2020-10-13 DIAGNOSIS — F411 Generalized anxiety disorder: Secondary | ICD-10-CM

## 2020-10-13 NOTE — Progress Notes (Signed)
[facility] ([facility]) Psychiatric Consultant Case Review   Melissa Tucker is a 70 y.o. year old female with a history of anxiety, diabetes, hyperlipidemia, HTN, who presents for follow up appointment for below.  Worsening in depression and anxiety in the context of loss of her ex-husband in April, who she used to have a great relationship with.   Assessment/Provisional Diagnosis # MDD She has been on current dose of Lexapro 10 mg for several months according to the chart.  Will consider switching to other antidepressant if she has not tried any to optimize treatment given we will not be able to uptitrate Lexapro for people with age >72.   Recommendation -Consider switching from Lexapro to other antidepressant to optimize treatment for depression.  Maximum dose of Lexapro for age over 62 is 10 mg daily.  - She is on Xanax 0.5 mg twice a day as needed for anxiety.  Would recommend limiting the use of Xanax only for short-term to avoid dependency/risk of fall.  -BH specialist to offer weekly therapy for grief, work on behavioral activation.   Thank you for your consult. We will continue to follow the patient. Please contact [facility]  for any questions or concerns.   The above treatment considerations and suggestions are based on consultation with the [facility] specialist and/or PCP and a review of information available in the shared registry and the patient's Electronic Health Record (EHR). I have not personally examined the patient. All recommendations should be implemented with consideration of the patient's relevant prior history and current clinical status. Please feel free to call me with any questions about the care of this patient.

## 2020-10-13 NOTE — BH Specialist Note (Signed)
[facility]al [facility] Treatment Plan Team Note  MRN: [id number] NAME: Melissa Tucker  DATE: 10/19/20  Start time:   415pEnd time:  420p Total time:  5 min  Total number of [facility] [place] Treatment Team Plan encounters: 1/4  Treatment Team Attendees: Royal Piedra, LCSW; Dr. Modesta Messing, Psychiatrist  Diagnoses:    ICD-10-CM   1. Anxiety state  F41.1       Goals, Interventions and Follow-up Plan Goals: Increase healthy adjustment to current life circumstances Begin healthy grieving over loss Interventions: Motivational Interviewing Mindfulness or Relaxation Training Medication Management Recommendations: consider switching to other SSRI or SSNRI to optimize treatment or anxiety and depression. The recommended maximum dose of a Lexapro is 57m over the age of 674 Recommend limiting use of Xanax only for short term.  Follow-up Plan: Weekly V[facility] sessions  History of the present illness Presenting Problem/Current Symptoms: continued symptoms of her diagnosis  Psychiatric History  Depression: No Anxiety: No Mania: No Psychosis: No PTSD symptoms: No  Past Psychiatric History/Hospitalization(s): Hospitalization for psychiatric illness: No Prior Suicide Attempts: No Prior Self-injurious behavior: No  Psychosocial stressors  Bereavement  Self-harm Behaviors Risk Assessment none  Screenings PHQ-9 Assessments:  [facility] 10/04/2020 09/10/2020 08/11/2020  Decreased Interest 3 1 0  Down, Depressed, Hopeless '1 1 3  ' PHQ - 2 Score '4 2 3  ' Altered sleeping '1 1 3  ' Tired, decreased energy 0 1 1  Change in appetite '1 1 1  ' Feeling bad or failure about yourself  0 0 0  Trouble concentrating 0 0 0  Moving slowly or fidgety/restless 0 0 0  Suicidal thoughts 0 0 0  PHQ-9 Score '6 5 8  ' Difficult doing work/chores Somewhat difficult Not difficult at all -   GAD-7 Assessments: No flowsheet data found.  Past Medical History Past Medical History:  Diagnosis Date   Anxiety  and depression    Chest tightness    DEEP VENOUS THROMBOPHLEBITIS 10/05/2009   Qualifier: History of  By: RLattie Haw MD, FClaud Kelp   Diabetes mellitus 1994   No insulin   DJD (degenerative joint disease)    History of DVT (deep vein thrombosis) 1989   Following childbirth   Hyperlipidemia    Hypertension    IBS (irritable bowel syndrome)    Rotator cuff syndrome of right shoulder 08/22/2011    Vital signs: There were no vitals filed for this visit.  Allergies:  Allergies as of 10/13/2020 - Review Complete 10/04/2020  Allergen Reaction Noted   Peanut-containing drug products Swelling 07/23/2017    Medication History Current medications:  Outpatient Encounter Medications as of 10/13/2020  Medication Sig   ALPRAZolam (XANAX) 0.5 MG tablet TAKE 1 TABLET BY MOUTH TWICE DAILY AS NEEDED FOR ANXIETY   amLODipine (NORVASC) 5 MG tablet Take 1 tablet (5 mg total) by mouth daily.   bisacodyl (DULCOLAX) 5 MG EC tablet Take 1 tablet (5 mg total) by mouth daily as needed for moderate constipation.   Blood Glucose Monitoring Suppl (ACCU-CHEK GUIDE) w/Device KIT USE TO TEST BLOOD GLUCOSE TWICE DAILY AS DIRECTED.   colesevelam (WELCHOL) 625 MG tablet Take 1,875 mg by mouth 2 (two) times daily as needed (irritable bowel syndrome).   dicyclomine (BENTYL) 10 MG capsule Take 10 mg by mouth daily.   Dulaglutide (TRULICITY) 3 MOE/3.2ZYSOPN Inject 3 mg as directed once a week.   escitalopram (LEXAPRO) 10 MG tablet Take 10 mg by mouth daily.   glipiZIDE (GLUCOTROL XL) 10 MG 24 hr  tablet TAKE 1 TABLET BY MOUTH ONCE A DAY.   HYDROcodone-acetaminophen (NORCO) 10-325 MG tablet TAKE 1 TABLET BY MOUTH FOUR TIMES DAILY AS NEEDED.   lisinopril (ZESTRIL) 10 MG tablet Take 1 tablet (10 mg total) by mouth daily.   metFORMIN (GLUCOPHAGE) 1000 MG tablet TAKE ONE TABLET BY MOUTH TWICE A DAY WITH A MEAL   metoCLOPramide (REGLAN) 5 MG tablet Take 5 mg by mouth 4 (four) times daily as needed for nausea or vomiting.    Multiple Vitamin (MULTIVITAMIN) tablet Take 1 tablet by mouth daily.   polyethylene glycol-electrolytes (TRILYTE) 420 g solution Take 4,000 mLs by mouth as directed. (Patient not taking: Reported on 10/04/2020)   simvastatin (ZOCOR) 40 MG tablet TAKE 1 TABLET BY MOUTH ONCE DAILY.   vitamin B-12 (CYANOCOBALAMIN) 1000 MCG tablet Take 1,000 mcg by mouth daily.   VITAMIN D PO Take 1 tablet by mouth daily.   [DISCONTINUED] glucose blood (ACCU-CHEK GUIDE) test strip TESTING ONE TIMES DAILY.   No facility-administered encounter medications on file as of 10/13/2020.     Scribe for Treatment Team: [name], LCSW

## 2020-10-18 ENCOUNTER — Telehealth: Payer: Self-pay | Admitting: "Endocrinology

## 2020-10-18 MED ORDER — ACCU-CHEK GUIDE VI STRP: ORAL_STRIP | 1 refills | Status: DC

## 2020-10-18 NOTE — Telephone Encounter (Signed)
Pt is calling and states that dr nida increased her testing to 2x a day therefore she is out of test strips and too early to fill. She is requesting a new script saying to test 2x a day/  [facility] - [place], [place] - [address] Phone:  [phone]  Fax:  [phone]

## 2020-10-18 NOTE — Telephone Encounter (Signed)
Rx sent 

## 2020-10-19 ENCOUNTER — Telehealth: Payer: Self-pay

## 2020-10-19 LAB — FECAL OCCULT BLOOD, IMMUNOCHEMICAL: IFOBT: NEGATIVE

## 2020-10-19 NOTE — Telephone Encounter (Signed)
Patient called waiting on Behavior therapist to return her call. Call back # [phone].

## 2020-10-26 ENCOUNTER — Other Ambulatory Visit: Payer: Self-pay | Admitting: Nurse Practitioner

## 2020-11-02 ENCOUNTER — Other Ambulatory Visit: Payer: Self-pay | Admitting: Nurse Practitioner

## 2020-11-03 ENCOUNTER — Ambulatory Visit (HOSPITAL_COMMUNITY)
Admission: RE | Admit: 2020-11-03 | Discharge: 2020-11-03 | Disposition: A | Discharge status: Home / Self Care | Payer: Medicare Other | Source: Ambulatory Visit | Attending: Gastroenterology | Admitting: Gastroenterology

## 2020-11-03 ENCOUNTER — Encounter (HOSPITAL_COMMUNITY): Payer: Self-pay | Admitting: Gastroenterology

## 2020-11-03 ENCOUNTER — Other Ambulatory Visit: Payer: Self-pay | Admitting: Nurse Practitioner

## 2020-11-03 ENCOUNTER — Ambulatory Visit (HOSPITAL_COMMUNITY): Payer: Medicare Other | Admitting: Anesthesiology

## 2020-11-03 ENCOUNTER — Other Ambulatory Visit: Payer: Self-pay

## 2020-11-03 ENCOUNTER — Encounter (HOSPITAL_COMMUNITY)
Admission: RE | Disposition: A | Discharge status: Home / Self Care | Payer: Self-pay | Source: Ambulatory Visit | Attending: Gastroenterology

## 2020-11-03 DIAGNOSIS — Z1211 Encounter for screening for malignant neoplasm of colon: Secondary | ICD-10-CM | POA: Diagnosis not present

## 2020-11-03 DIAGNOSIS — Z7984 Long term (current) use of oral hypoglycemic drugs: Secondary | ICD-10-CM | POA: Diagnosis not present

## 2020-11-03 DIAGNOSIS — E785 Hyperlipidemia, unspecified: Secondary | ICD-10-CM | POA: Insufficient documentation

## 2020-11-03 DIAGNOSIS — Z96653 Presence of artificial knee joint, bilateral: Secondary | ICD-10-CM | POA: Diagnosis not present

## 2020-11-03 DIAGNOSIS — I10 Essential (primary) hypertension: Secondary | ICD-10-CM | POA: Diagnosis not present

## 2020-11-03 DIAGNOSIS — F32A Depression, unspecified: Secondary | ICD-10-CM | POA: Diagnosis not present

## 2020-11-03 DIAGNOSIS — F419 Anxiety disorder, unspecified: Secondary | ICD-10-CM | POA: Diagnosis not present

## 2020-11-03 DIAGNOSIS — Z86718 Personal history of other venous thrombosis and embolism: Secondary | ICD-10-CM | POA: Diagnosis not present

## 2020-11-03 DIAGNOSIS — Z91010 Allergy to peanuts: Secondary | ICD-10-CM | POA: Diagnosis not present

## 2020-11-03 DIAGNOSIS — Z79899 Other long term (current) drug therapy: Secondary | ICD-10-CM | POA: Insufficient documentation

## 2020-11-03 DIAGNOSIS — E119 Type 2 diabetes mellitus without complications: Secondary | ICD-10-CM | POA: Diagnosis not present

## 2020-11-03 DIAGNOSIS — K589 Irritable bowel syndrome without diarrhea: Secondary | ICD-10-CM | POA: Diagnosis not present

## 2020-11-03 DIAGNOSIS — Z87891 Personal history of nicotine dependence: Secondary | ICD-10-CM | POA: Diagnosis not present

## 2020-11-03 HISTORY — PX: COLONOSCOPY WITH PROPOFOL: SHX5780

## 2020-11-03 LAB — GLUCOSE, CAPILLARY: Glucose-Capillary: 195 mg/dL — ABNORMAL HIGH (ref 70–99)

## 2020-11-03 LAB — HM COLONOSCOPY

## 2020-11-03 SURGERY — COLONOSCOPY WITH PROPOFOL
Anesthesia: General

## 2020-11-03 MED ORDER — PROPOFOL 500 MG/50ML IV EMUL
INTRAVENOUS | Status: DC | PRN
  Administered 2020-11-03: 150 ug/kg/min via INTRAVENOUS

## 2020-11-03 MED ORDER — STERILE WATER FOR IRRIGATION IR SOLN
Status: DC | PRN
  Administered 2020-11-03: 200 mL

## 2020-11-03 MED ORDER — PROPOFOL 10 MG/ML IV BOLUS
INTRAVENOUS | Status: DC | PRN
  Administered 2020-11-03: 30 mg via INTRAVENOUS
  Administered 2020-11-03: 50 mg via INTRAVENOUS

## 2020-11-03 MED ORDER — PROPOFOL 10 MG/ML IV BOLUS
INTRAVENOUS | Status: AC
  Filled 2020-11-03: qty 80

## 2020-11-03 MED ORDER — LACTATED RINGERS IV SOLN
INTRAVENOUS | Status: DC | PRN
Start: 2020-11-03 — End: 2020-11-03

## 2020-11-03 MED ORDER — LIDOCAINE HCL (PF) 2 % IJ SOLN
INTRAMUSCULAR | Status: AC
  Filled 2020-11-03: qty 5

## 2020-11-03 MED ORDER — PHENYLEPHRINE HCL (PRESSORS) 10 MG/ML IV SOLN
INTRAVENOUS | Status: DC | PRN
  Administered 2020-11-03 (×2): 80 ug via INTRAVENOUS

## 2020-11-03 MED ORDER — EPHEDRINE SULFATE 50 MG/ML IJ SOLN
INTRAMUSCULAR | Status: DC | PRN
  Administered 2020-11-03: 10 mg via INTRAVENOUS

## 2020-11-03 NOTE — Discharge Instructions (Signed)
You are being discharged to home.  Resume your previous diet.  Your physician has recommended a repeat colonoscopy in 10 years for screening purposes.  

## 2020-11-03 NOTE — H&P (Signed)
Melissa Tucker is an 70 y.o. female.   Chief Complaint: CRC screening HPI: 70 y/o F with PM DM, anxiety, depression, IBS, hypertension, hyperlipidemia, coming for screening colonoscopy.  Last colonoscopy was more than 10 years ago.  The patient denies having any complaints such as melena, hematochezia, abdominal pain or distention, change in her bowel movement consistency or frequency, no changes in her weight recently.  No family history of colorectal cancer.   Past Medical History:  Diagnosis Date   Anxiety and depression    Chest tightness    DEEP VENOUS THROMBOPHLEBITIS 10/05/2009   Qualifier: History of  By: Lattie Haw, MD, Claud Kelp    Diabetes mellitus 1994   No insulin   DJD (degenerative joint disease)    History of DVT (deep vein thrombosis) 1989   Following childbirth   Hyperlipidemia    Hypertension    IBS (irritable bowel syndrome)    Rotator cuff syndrome of right shoulder 08/22/2011    Past Surgical History:  Procedure Laterality Date   ABDOMINAL HYSTERECTOMY     CESAREAN SECTION  1988   CHOLECYSTECTOMY     COLONOSCOPY  2005   Normal findings   JOINT REPLACEMENT N/A    [employer] 02/28/2020   ORIF ANKLE FRACTURE Right 05/16/2019   Procedure: OPEN REDUCTION INTERNAL FIXATION (ORIF) ANKLE FRACTURE;  Surgeon: Shona Needles, MD;  Location: [place];  Service: Orthopedics;  Laterality: Right;   ORIF FEMUR FRACTURE Right 05/16/2019   Procedure: OPEN REDUCTION INTERNAL FIXATION (ORIF) DISTAL FEMUR FRACTURE;  Surgeon: Shona Needles, MD;  Location: [place];  Service: Orthopedics;  Laterality: Right;   SHOULDER SURGERY     Left shoulder manipulation with subcrominal decompression, capsulitis   TOTAL KNEE ARTHROPLASTY  2003   Left   TOTAL KNEE ARTHROPLASTY  2007   Right    Family History  Problem Relation Age of Onset   Heart disease Mother    Hypertension Mother    Cervical cancer Mother    Esophageal cancer Father    Hypertension Sister    Social History:  reports  that she quit smoking about 32 years ago. Her smoking use included cigarettes. She has quit using smokeless tobacco. She reports that she does not drink alcohol and does not use drugs.  Allergies:  Allergies  Allergen Reactions   Peanut-Containing Drug Products Swelling    Medications Prior to Admission  Medication Sig Dispense Refill   ALPRAZolam (XANAX) 0.5 MG tablet TAKE 1 TABLET BY MOUTH TWICE DAILY AS NEEDED FOR ANXIETY 60 tablet 0   amLODipine (NORVASC) 5 MG tablet Take 1 tablet (5 mg total) by mouth daily. (Patient taking differently: Take 5 mg by mouth in the morning.) 90 tablet 1   bismuth subsalicylate (PEPTO BISMOL) 262 MG/15ML suspension Take 30 mLs by mouth every 6 (six) hours as needed (ibs/diarrhea).     Blood Glucose Monitoring Suppl (ACCU-CHEK GUIDE) w/Device KIT USE TO TEST BLOOD GLUCOSE TWICE DAILY AS DIRECTED. 1 kit 0   Cholecalciferol (VITAMIN D-3) 125 MCG (5000 UT) TABS Take 5,000 Units by mouth in the morning.     colesevelam (WELCHOL) 625 MG tablet Take 1,875 mg by mouth 2 (two) times daily as needed (irritable bowel syndrome).     dicyclomine (BENTYL) 10 MG capsule Take 10 mg by mouth daily as needed (IBS).     docusate sodium (COLACE) 100 MG capsule Take 100 mg by mouth 2 (two) times daily as needed (constipation).     Dulaglutide (TRULICITY)  3 MG/0.5ML SOPN Inject 3 mg as directed once a week. (Patient taking differently: Inject 3 mg as directed every Tuesday.) 2 mL 2   escitalopram (LEXAPRO) 20 MG tablet Take 20 mg by mouth in the morning.     GAVILYTE-G 236 g solution Take 4,000 mLs by mouth as directed.     glipiZIDE (GLUCOTROL XL) 10 MG 24 hr tablet TAKE 1 TABLET BY MOUTH ONCE A DAY. (Patient taking differently: Take 10 mg by mouth in the morning.) 90 tablet 0   glucose blood (ACCU-CHEK GUIDE) test strip TESTING TWO TIMES DAILY. Dx E11.65 100 strip 1   HYDROcodone-acetaminophen (NORCO) 10-325 MG tablet TAKE 1 TABLET BY MOUTH FOUR TIMES DAILY AS NEEDED. (Patient  taking differently: Take 1 tablet by mouth 4 (four) times daily as needed (pain).) 120 tablet 0   lisinopril (ZESTRIL) 10 MG tablet Take 1 tablet (10 mg total) by mouth daily. (Patient taking differently: Take 10 mg by mouth in the morning.) 90 tablet 3   loratadine (CLARITIN) 10 MG tablet Take 10 mg by mouth daily as needed for allergies.     metFORMIN (GLUCOPHAGE) 1000 MG tablet TAKE ONE TABLET BY MOUTH TWICE A DAY WITH A MEAL (Patient taking differently: Take 1,000 mg by mouth in the morning and at bedtime.) 180 tablet 0   Multiple Vitamin (MULTIVITAMIN WITH MINERALS) TABS tablet Take 1 tablet by mouth daily. One A Day for Women     Polyethyl Glycol-Propyl Glycol (LUBRICANT EYE DROPS) 0.4-0.3 % SOLN Place 1-2 drops into both eyes 3 (three) times daily as needed (dry/irritated/allergy eyes).     QUEtiapine (SEROQUEL) 50 MG tablet Take 50 mg by mouth at bedtime as needed (sleep).     simvastatin (ZOCOR) 40 MG tablet TAKE 1 TABLET BY MOUTH ONCE DAILY. 30 tablet 0    No results found for this or any previous visit (from the past 48 hour(s)). No results found.  Review of Systems  Constitutional: Negative.   HENT: Negative.    Eyes: Negative.   Respiratory: Negative.    Cardiovascular: Negative.   Gastrointestinal: Negative.   Endocrine: Negative.   Genitourinary: Negative.   Musculoskeletal: Negative.   Skin: Negative.   Allergic/Immunologic: Negative.   Neurological: Negative.   Hematological: Negative.   Psychiatric/Behavioral: Negative.     Blood pressure (!) 161/72, pulse 94, temperature 98.5 F (36.9 C), temperature source Oral, resp. rate 16, height _0  (1.753 m), SpO2 95 %. Physical Exam  GENERAL: The patient is AO x3, in no acute distress. HEENT: Head is normocephalic and atraumatic. EOMI are intact. Mouth is well hydrated and without lesions. NECK: Supple. No masses LUNGS: Clear to auscultation. No presence of rhonchi/wheezing/rales. Adequate chest expansion HEART: RRR,  normal s1 and s2. ABDOMEN: Soft, nontender, no guarding, no peritoneal signs, and nondistended. BS +. No masses. EXTREMITIES: Without any cyanosis, clubbing, rash, lesions or edema. NEUROLOGIC: AOx3, no focal motor deficit. SKIN: no jaundice, no rashes  Assessment/Plan 70 y/o F with PM DM, anxiety, depression, IBS, hypertension, hyperlipidemia, coming for screening colonoscopy. The patient is at average risk for colorectal cancer.  We will proceed with colonoscopy today.   Harvel Quale, MD 11/03/2020, 8:31 AM

## 2020-11-03 NOTE — Anesthesia Postprocedure Evaluation (Signed)
Anesthesia Post Note  Patient: Melissa Tucker  Procedure(s) Performed: COLONOSCOPY WITH PROPOFOL  Patient location during evaluation: PACU Anesthesia Type: General Level of consciousness: awake and alert Pain management: pain level controlled Vital Signs Assessment: post-procedure vital signs reviewed and stable Respiratory status: spontaneous breathing Cardiovascular status: blood pressure returned to baseline and stable Postop Assessment: no apparent nausea or vomiting Anesthetic complications: no   No notable events documented.   Last Vitals:  Vitals:   11/03/20 0717  BP: (!) 161/72  Pulse: 94  Resp: 16  Temp: 36.9 C  SpO2: 95%    Last Pain:  Vitals:   11/03/20 0834  TempSrc:   PainSc: 0-No pain                 [facility]

## 2020-11-03 NOTE — Anesthesia Preprocedure Evaluation (Signed)
Anesthesia Evaluation  Patient identified by MRN, date of birth, ID band Patient awake    Reviewed: Allergy & Precautions, H&P , NPO status , Patient's Chart, lab work & pertinent test results, reviewed documented beta blocker date and time   Airway Mallampati: II  TM Distance: >3 FB Neck ROM: full    Dental no notable dental hx.    Pulmonary neg pulmonary ROS, former smoker,    Pulmonary exam normal breath sounds clear to auscultation       Cardiovascular Exercise Tolerance: Good hypertension, negative cardio ROS   Rhythm:regular Rate:Normal     Neuro/Psych PSYCHIATRIC DISORDERS Anxiety Depression negative neurological ROS     GI/Hepatic negative GI ROS, Neg liver ROS,   Endo/Other  negative endocrine ROSdiabetes  Renal/GU negative Renal ROS  negative genitourinary   Musculoskeletal   Abdominal   Peds  Hematology negative hematology ROS (+)   Anesthesia Other Findings   Reproductive/Obstetrics negative OB ROS                             Anesthesia Physical Anesthesia Plan  ASA: 2  Anesthesia Plan: General   Post-op Pain Management:    Induction:   PONV Risk Score and Plan: Propofol infusion  Airway Management Planned:   Additional Equipment:   Intra-op Plan:   Post-operative Plan:   Informed Consent: I have reviewed the patients History and Physical, chart, labs and discussed the procedure including the risks, benefits and alternatives for the proposed anesthesia with the patient or authorized representative who has indicated his/her understanding and acceptance.     Dental Advisory Given  Plan Discussed with: CRNA  Anesthesia Plan Comments:         Anesthesia Quick Evaluation

## 2020-11-03 NOTE — Op Note (Signed)
[facility] Patient Name: Melissa Tucker Procedure Date: 11/03/2020 8:02 AM MRN: [id number] Date of Birth: 1950/06/20 Attending MD: Katrinka Blazing ,  CSN: [id number] Age: 70 Admit Type: Outpatient Procedure:                Colonoscopy Indications:              Screening for colorectal malignant neoplasm Providers:                Katrinka Blazing, Crystal Page, Dyann Ruddle Referring MD:              Medicines:                Monitored Anesthesia Care Complications:            No immediate complications. Estimated Blood Loss:     Estimated blood loss: none. Procedure:                Pre-Anesthesia Assessment:                           - Prior to the procedure, a History and Physical                            was performed, and patient medications, allergies                            and sensitivities were reviewed. The patient's                            tolerance of previous anesthesia was reviewed.                           - The risks and benefits of the procedure and the                            sedation options and risks were discussed with the                            patient. All questions were answered and informed                            consent was obtained.                           - ASA Grade Assessment: II - A patient with mild                            systemic disease.                           After obtaining informed consent, the colonoscope                            was passed under direct vision. Throughout the                            procedure, the patient's blood pressure, pulse,  and                            oxygen saturations were monitored continuously. The                            [id number]-[id number] ([id number]) scope was introduced through                            the anus and advanced to the the cecum, identified                            by appendiceal orifice and ileocecal valve. The                            colonoscopy was performed  without difficulty. The                            patient tolerated the procedure well. The quality                            of the bowel preparation was adequate. Scope In: 8:39:53 AM Scope Out: 9:04:02 AM Scope Withdrawal Time: 0 hours 16 minutes 10 seconds  Total Procedure Duration: 0 hours 24 minutes 9 seconds  Findings:      The perianal and digital rectal examinations were normal.      The colon (entire examined portion) appeared normal.      The retroflexed view of the distal rectum and anal verge was normal and       showed no anal or rectal abnormalities. Impression:               - The entire examined colon is normal.                           - The distal rectum and anal verge are normal on                            retroflexion view.                           - No specimens collected. Moderate Sedation:      Per Anesthesia Care Recommendation:           - Discharge patient to home (ambulatory).                           - Resume previous diet.                           - Repeat colonoscopy in 10 years for screening                            purposes. Procedure Code(s):        --- Professional ---                           A5409, Colorectal cancer screening;  colonoscopy on                            individual not meeting criteria for high risk Diagnosis Code(s):        --- Professional ---                           Z12.11, Encounter for screening for malignant                            neoplasm of colon CPT copyright 2019 [employer] Medical Association. All rights reserved. The codes documented in this report are preliminary and upon coder review may  be revised to meet current compliance requirements. Katrinka Blazing, MD Katrinka Blazing,  11/03/2020 10:00:03 AM This report has been signed electronically. Number of Addenda: 0

## 2020-11-03 NOTE — Transfer of Care (Signed)
Immediate Anesthesia Transfer of Care Note  Patient: Melissa Tucker  Procedure(s) Performed: COLONOSCOPY WITH PROPOFOL  Patient Location: Endoscopy Unit  Anesthesia Type:General  Level of Consciousness: awake  Airway & Oxygen Therapy: Patient Spontanous Breathing  Post-op Assessment: Report given to RN  Post vital signs: Reviewed and stable  Last Vitals:  Vitals Value Taken Time  BP    Temp    Pulse    Resp    SpO2      Last Pain:  Vitals:   11/03/20 0834  TempSrc:   PainSc: 0-No pain      Patients Stated Pain Goal: 3 (11/03/20 0717)  Complications: No notable events documented.

## 2020-11-04 ENCOUNTER — Encounter (INDEPENDENT_AMBULATORY_CARE_PROVIDER_SITE_OTHER): Payer: Self-pay | Admitting: *Deleted

## 2020-11-08 ENCOUNTER — Encounter (HOSPITAL_COMMUNITY): Payer: Self-pay | Admitting: Gastroenterology

## 2020-11-11 ENCOUNTER — Encounter: Payer: Self-pay | Admitting: Nurse Practitioner

## 2020-11-11 ENCOUNTER — Telehealth (INDEPENDENT_AMBULATORY_CARE_PROVIDER_SITE_OTHER): Payer: Medicare Other | Admitting: Nurse Practitioner

## 2020-11-11 ENCOUNTER — Ambulatory Visit: Payer: Medicare Other | Admitting: Nurse Practitioner

## 2020-11-11 ENCOUNTER — Other Ambulatory Visit: Payer: Self-pay | Admitting: "Endocrinology

## 2020-11-11 ENCOUNTER — Other Ambulatory Visit: Payer: Self-pay

## 2020-11-11 VITALS — Ht 69.0 in | Wt 199.0 lb

## 2020-11-11 DIAGNOSIS — J069 Acute upper respiratory infection, unspecified: Secondary | ICD-10-CM | POA: Diagnosis not present

## 2020-11-11 MED ORDER — NIRMATRELVIR/RITONAVIR (PAXLOVID)TABLET
3.0000 | ORAL_TABLET | Freq: Two times a day (BID) | ORAL | 0 refills | Status: AC
Start: 2020-11-11 — End: 2020-11-16

## 2020-11-11 MED ORDER — NIRMATRELVIR/RITONAVIR (PAXLOVID)TABLET: 3.0000 | ORAL_TABLET | Freq: Two times a day (BID) | ORAL | 0 refills | Status: DC

## 2020-11-11 MED ORDER — NOREL AD 4-10-325 MG PO TABS
1.0000 | ORAL_TABLET | ORAL | 1 refills | Status: DC | PRN
Start: 2020-11-11 — End: 2020-11-29

## 2020-11-11 NOTE — Assessment & Plan Note (Signed)
-  several recent COVID positive contacts at church; presumptive positive -she lacks transportation to get COVID testing today -Rx. norel -Rx. paxlovid

## 2020-11-11 NOTE — Progress Notes (Signed)
Acute Office Visit  Subjective:    Patient ID: Melissa Tucker, female    DOB: 1950/07/10, 70 y.o.   MRN: [id number]  Chief Complaint  Patient presents with   Sinus Problem    Ongoing x4 days. Has been exposed to several members at church who have now tested positive for Covid.    Cough    Ongoing x4 days. Non productive.    Sinus Problem Associated symptoms include congestion and coughing. Pertinent negatives include no chills, shortness of breath, sinus pressure or sore throat.  Cough Associated symptoms include rhinorrhea. Pertinent negatives include no chills, fever, sore throat, shortness of breath or wheezing.  Patient is in today for sick visit. She has tried taking claritin and mucinex.  Past Medical History:  Diagnosis Date   Anxiety and depression    Chest tightness    DEEP VENOUS THROMBOPHLEBITIS 10/05/2009   Qualifier: History of  By: Lattie Haw, MD, Claud Kelp    Diabetes mellitus 1994   No insulin   DJD (degenerative joint disease)    History of DVT (deep vein thrombosis) 1989   Following childbirth   Hyperlipidemia    Hypertension    IBS (irritable bowel syndrome)    Rotator cuff syndrome of right shoulder 08/22/2011    Past Surgical History:  Procedure Laterality Date   ABDOMINAL HYSTERECTOMY     CESAREAN SECTION  1988   CHOLECYSTECTOMY     COLONOSCOPY  2005   Normal findings   COLONOSCOPY WITH PROPOFOL N/A 11/03/2020   Procedure: COLONOSCOPY WITH PROPOFOL;  Surgeon: Harvel Quale, MD;  [facility];  Service: Gastroenterology;  Laterality: N/A;  8:30   JOINT REPLACEMENT N/A    Phreesia 02/28/2020   ORIF ANKLE FRACTURE Right 05/16/2019   Procedure: OPEN REDUCTION INTERNAL FIXATION (ORIF) ANKLE FRACTURE;  Surgeon: Shona Needles, MD;  Location: [place];  Service: Orthopedics;  Laterality: Right;   ORIF FEMUR FRACTURE Right 05/16/2019   Procedure: OPEN REDUCTION INTERNAL FIXATION (ORIF) DISTAL FEMUR FRACTURE;  Surgeon: Shona Needles, MD;  Location: [place];  Service: Orthopedics;  Laterality: Right;   SHOULDER SURGERY     Left shoulder manipulation with subcrominal decompression, capsulitis   TOTAL KNEE ARTHROPLASTY  2003   Left   TOTAL KNEE ARTHROPLASTY  2007   Right    Family History  Problem Relation Age of Onset   Heart disease Mother    Hypertension Mother    Cervical cancer Mother    Esophageal cancer Father    Hypertension Sister     Social History   Socioeconomic History   Marital status: Divorced    Spouse name: Not on file   Number of children: Not on file   Years of education: Not on file   Highest education level: Not on file  Occupational History   Occupation: disabled    Employer: UNEMPLOYED  Tobacco Use   Smoking status: Former    Types: Cigarettes    Quit date: 04/10/1988    Years since quitting: 32.6   Smokeless tobacco: Former   Tobacco comments:    Minimal prior use  Vaping Use   Vaping Use: Never used  Substance and Sexual Activity   Alcohol use: No   Drug use: No   Sexual activity: Not Currently    Birth control/protection: Surgical    Comment: hyst  Other Topics Concern   Not on file  Social History Narrative   Divorced   Resides with daughter, also  has 2 sons   No regular exercise   Social Determinants of Health   Financial Resource Strain: Low Risk    Difficulty of Paying Living Expenses: Not hard at all  Food Insecurity: No Food Insecurity   Worried About Charity fundraiser in the Last Year: Never true   Ran Out of Food in the Last Year: Never true  Transportation Needs: No Transportation Needs   Lack of Transportation (Medical): No   Lack of Transportation (Non-Medical): No  Physical Activity: Inactive   Days of Exercise per Week: 0 days   Minutes of Exercise per Session: 0 min  Stress: No Stress Concern Present   Feeling of Stress : Not at all  Social Connections: Moderately Isolated   Frequency of Communication with Friends and Family: More than three  times a week   Frequency of Social Gatherings with Friends and Family: More than three times a week   Attends Religious Services: 1 to 4 times per year   Active Member of [employer] or Organizations: No   Attends Archivist Meetings: Never   Marital Status: Divorced  Human resources officer Violence: Not At Risk   Fear of Current or Ex-Partner: No   Emotionally Abused: No   Physically Abused: No   Sexually Abused: No    Outpatient Medications Prior to Visit  Medication Sig Dispense Refill   ALPRAZolam (XANAX) 0.5 MG tablet TAKE 1 TABLET BY MOUTH TWICE DAILY AS NEEDED FOR ANXIETY 60 tablet 0   amLODipine (NORVASC) 5 MG tablet Take 1 tablet (5 mg total) by mouth daily. (Patient taking differently: Take 5 mg by mouth in the morning.) 90 tablet 1   bismuth subsalicylate (PEPTO BISMOL) 262 MG/15ML suspension Take 30 mLs by mouth every 6 (six) hours as needed (ibs/diarrhea).     Blood Glucose Monitoring Suppl (ACCU-CHEK GUIDE) w/Device KIT USE TO TEST BLOOD GLUCOSE TWICE DAILY AS DIRECTED. 1 kit 0   Cholecalciferol (VITAMIN D-3) 125 MCG (5000 UT) TABS Take 5,000 Units by mouth in the morning.     colesevelam (WELCHOL) 625 MG tablet Take 1,875 mg by mouth 2 (two) times daily as needed (irritable bowel syndrome).     dicyclomine (BENTYL) 10 MG capsule Take 10 mg by mouth daily as needed (IBS).     docusate sodium (COLACE) 100 MG capsule Take 100 mg by mouth 2 (two) times daily as needed (constipation).     Dulaglutide (TRULICITY) 3 FA/2.1HY SOPN Inject 3 mg as directed once a week. (Patient taking differently: Inject 3 mg as directed every Tuesday.) 2 mL 2   escitalopram (LEXAPRO) 20 MG tablet Take 20 mg by mouth in the morning.     glipiZIDE (GLUCOTROL XL) 10 MG 24 hr tablet TAKE 1 TABLET BY MOUTH ONCE A DAY. (Patient taking differently: Take 10 mg by mouth in the morning.) 90 tablet 0   glucose blood (ACCU-CHEK GUIDE) test strip TESTING TWO TIMES DAILY. Dx E11.65 100 strip 1    HYDROcodone-acetaminophen (NORCO) 10-325 MG tablet TAKE 1 TABLET BY MOUTH FOUR TIMES DAILY AS NEEDED. (Patient taking differently: Take 1 tablet by mouth 4 (four) times daily as needed (pain).) 120 tablet 0   lisinopril (ZESTRIL) 10 MG tablet Take 1 tablet (10 mg total) by mouth daily. (Patient taking differently: Take 10 mg by mouth in the morning.) 90 tablet 3   loratadine (CLARITIN) 10 MG tablet Take 10 mg by mouth daily as needed for allergies.     metFORMIN (GLUCOPHAGE) 1000 MG tablet  TAKE ONE TABLET BY MOUTH TWICE A DAY WITH A MEAL (Patient taking differently: Take 1,000 mg by mouth in the morning and at bedtime.) 180 tablet 0   Multiple Vitamin (MULTIVITAMIN WITH MINERALS) TABS tablet Take 1 tablet by mouth daily. One A Day for Women     Polyethyl Glycol-Propyl Glycol (LUBRICANT EYE DROPS) 0.4-0.3 % SOLN Place 1-2 drops into both eyes 3 (three) times daily as needed (dry/irritated/allergy eyes).     QUEtiapine (SEROQUEL) 50 MG tablet Take 50 mg by mouth at bedtime as needed (sleep).     simvastatin (ZOCOR) 40 MG tablet TAKE 1 TABLET BY MOUTH ONCE DAILY. 30 tablet 0   No facility-administered medications prior to visit.    Allergies  Allergen Reactions   Peanut-Containing Drug Products Swelling    Review of Systems  Constitutional:  Negative for chills, fatigue and fever.  HENT:  Positive for congestion and rhinorrhea. Negative for sinus pressure, sinus pain and sore throat.   Respiratory:  Positive for cough. Negative for shortness of breath and wheezing.   Cardiovascular: Negative.       Objective:    Physical Exam  Ht 5' 9" (1.753 m)   Wt 199 lb (90.3 kg)   BMI 29.39 kg/m  Wt Readings from Last 3 Encounters:  11/11/20 199 lb (90.3 kg)  09/20/20 208 lb 12.8 oz (94.7 kg)  09/10/20 206 lb (93.4 kg)    Health Maintenance Due  Topic Date Due   FOOT EXAM  Never done   OPHTHALMOLOGY EXAM  Never done   DEXA SCAN  Never done   INFLUENZA VACCINE  11/08/2020    There are  no preventive care reminders to display for this patient.   Lab Results  Component Value Date   TSH 0.391 (L) 09/10/2020   Lab Results  Component Value Date   WBC 6.3 08/11/2020   HGB 11.3 08/11/2020   HCT 34.5 08/11/2020   MCV 91 08/11/2020   PLT 397 08/11/2020   Lab Results  Component Value Date   NA 141 09/10/2020   K 4.6 09/10/2020   CO2 26 09/10/2020   GLUCOSE 198 (H) 09/10/2020   BUN 9 09/10/2020   CREATININE 0.59 09/10/2020   BILITOT 0.3 09/10/2020   ALKPHOS 107 09/10/2020   AST 22 09/10/2020   ALT 22 09/10/2020   PROT 7.0 09/10/2020   ALBUMIN 4.7 09/10/2020   CALCIUM 10.5 (H) 09/10/2020   ANIONGAP 10 05/15/2019   EGFR 97 09/10/2020   Lab Results  Component Value Date   CHOL 151 08/11/2020   Lab Results  Component Value Date   HDL 61 08/11/2020   Lab Results  Component Value Date   LDLCALC 72 08/11/2020   Lab Results  Component Value Date   TRIG 98 08/11/2020   No results found for: CHOLHDL Lab Results  Component Value Date   HGBA1C 8.3 (A) 09/20/2020       Assessment & Plan:   Problem List Items Addressed This Visit       Respiratory   Upper respiratory tract infection - Primary    -several recent COVID positive contacts at church; presumptive positive -she lacks transportation to get COVID testing today -Rx. norel -Rx. paxlovid       Relevant Medications   Chlorphen-PE-Acetaminophen (NOREL AD) 4-10-325 MG TABS   nirmatrelvir/ritonavir EUA (PAXLOVID) TABS     Meds ordered this encounter  Medications   Chlorphen-PE-Acetaminophen (NOREL AD) 4-10-325 MG TABS    Sig: Take 1 tablet by  mouth every 4 (four) hours as needed (nasal congestion, cold symptoms).    Dispense:  20 tablet    Refill:  1   nirmatrelvir/ritonavir EUA (PAXLOVID) TABS    Sig: Take 3 tablets by mouth 2 (two) times daily for 5 days. (Take nirmatrelvir 150 mg two tablets twice daily for 5 days and ritonavir 100 mg one tablet twice daily for 5 days) Patient GFR is 97     Dispense:  30 tablet    Refill:  0   Date:  11/11/2020   Location of Patient: Home Location of Provider: Office Consent was obtain for visit to be over via telehealth. I verified that I am speaking with the correct person using two identifiers.  I connected with  Ulyses Jarred on 11/11/20 via telephone and verified that I am speaking with the correct person using two identifiers.   I discussed the limitations of evaluation and management by telemedicine. The patient expressed understanding and agreed to proceed.  Time spent: 8 minutes   Noreene Larsson, NP

## 2020-11-13 ENCOUNTER — Other Ambulatory Visit: Payer: Self-pay | Admitting: Nurse Practitioner

## 2020-11-17 ENCOUNTER — Other Ambulatory Visit: Payer: Self-pay

## 2020-11-17 ENCOUNTER — Ambulatory Visit (INDEPENDENT_AMBULATORY_CARE_PROVIDER_SITE_OTHER): Payer: Medicare Other | Admitting: Licensed Clinical Social Worker

## 2020-11-17 DIAGNOSIS — F411 Generalized anxiety disorder: Secondary | ICD-10-CM

## 2020-11-24 ENCOUNTER — Other Ambulatory Visit: Payer: Self-pay

## 2020-11-24 ENCOUNTER — Ambulatory Visit (INDEPENDENT_AMBULATORY_CARE_PROVIDER_SITE_OTHER): Payer: Medicare Other | Admitting: Licensed Clinical Social Worker

## 2020-11-24 DIAGNOSIS — F411 Generalized anxiety disorder: Secondary | ICD-10-CM

## 2020-11-24 NOTE — Progress Notes (Signed)
Left message encouraging contact 

## 2020-11-25 ENCOUNTER — Other Ambulatory Visit: Payer: Self-pay | Admitting: "Endocrinology

## 2020-11-25 ENCOUNTER — Other Ambulatory Visit: Payer: Self-pay | Admitting: Nurse Practitioner

## 2020-11-29 ENCOUNTER — Telehealth: Payer: Self-pay

## 2020-11-29 NOTE — Telephone Encounter (Signed)
Pt is asking for Pain Med - She said you will know what she needs

## 2020-11-29 NOTE — Telephone Encounter (Signed)
Pt informed

## 2020-12-01 ENCOUNTER — Other Ambulatory Visit: Payer: Self-pay | Admitting: Nurse Practitioner

## 2020-12-02 ENCOUNTER — Other Ambulatory Visit: Payer: Self-pay

## 2020-12-02 ENCOUNTER — Encounter: Payer: Self-pay | Admitting: Nurse Practitioner

## 2020-12-02 ENCOUNTER — Ambulatory Visit (INDEPENDENT_AMBULATORY_CARE_PROVIDER_SITE_OTHER): Payer: Medicare Other | Admitting: Nurse Practitioner

## 2020-12-02 DIAGNOSIS — M545 Low back pain, unspecified: Secondary | ICD-10-CM

## 2020-12-02 DIAGNOSIS — E6609 Other obesity due to excess calories: Secondary | ICD-10-CM

## 2020-12-02 DIAGNOSIS — E782 Mixed hyperlipidemia: Secondary | ICD-10-CM | POA: Diagnosis not present

## 2020-12-02 DIAGNOSIS — G8929 Other chronic pain: Secondary | ICD-10-CM | POA: Diagnosis not present

## 2020-12-02 DIAGNOSIS — E1165 Type 2 diabetes mellitus with hyperglycemia: Secondary | ICD-10-CM | POA: Diagnosis not present

## 2020-12-02 DIAGNOSIS — I10 Essential (primary) hypertension: Secondary | ICD-10-CM

## 2020-12-02 DIAGNOSIS — Z6831 Body mass index (BMI) 31.0-31.9, adult: Secondary | ICD-10-CM

## 2020-12-02 NOTE — Assessment & Plan Note (Signed)
BP Readings from Last 3 Encounters:  12/02/20 127/64  11/03/20 (!) 108/47  09/20/20 (!) 143/76   BP is great

## 2020-12-02 NOTE — Assessment & Plan Note (Signed)
labs ordered

## 2020-12-02 NOTE — Assessment & Plan Note (Signed)
BMI Readings from Last 3 Encounters:  12/02/20 29.24 kg/m  11/11/20 29.39 kg/m  11/03/20 30.83 kg/m   Wt Readings from Last 3 Encounters:  12/02/20 198 lb (89.8 kg)  11/11/20 199 lb (90.3 kg)  09/20/20 208 lb 12.8 oz (94.7 kg)

## 2020-12-02 NOTE — Assessment & Plan Note (Signed)
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

## 2020-12-02 NOTE — Assessment & Plan Note (Signed)
-   check A1c

## 2020-12-02 NOTE — Patient Instructions (Signed)
Please have fasting labs drawn this week. 

## 2020-12-02 NOTE — Progress Notes (Signed)
Acute Office Visit  Subjective:    Patient ID: Melissa Tucker, female    DOB: 1951-02-13, 70 y.o.   MRN: [id number]  Chief Complaint  Patient presents with   Hypertension    Follow up    HPI Patient is in today for BP follow-up and med check.  Past Medical History:  Diagnosis Date   Anxiety and depression    Chest tightness    DEEP VENOUS THROMBOPHLEBITIS 10/05/2009   Qualifier: History of  By: Lattie Haw, MD, Claud Kelp    Diabetes mellitus 1994   No insulin   DJD (degenerative joint disease)    History of DVT (deep vein thrombosis) 1989   Following childbirth   Hyperlipidemia    Hypertension    IBS (irritable bowel syndrome)    Rotator cuff syndrome of right shoulder 08/22/2011    Past Surgical History:  Procedure Laterality Date   ABDOMINAL HYSTERECTOMY     CESAREAN SECTION  1988   CHOLECYSTECTOMY     COLONOSCOPY  2005   Normal findings   COLONOSCOPY WITH PROPOFOL N/A 11/03/2020   Procedure: COLONOSCOPY WITH PROPOFOL;  Surgeon: Harvel Quale, MD;  [facility];  Service: Gastroenterology;  Laterality: N/A;  8:30   JOINT REPLACEMENT N/A    Phreesia 02/28/2020   ORIF ANKLE FRACTURE Right 05/16/2019   Procedure: OPEN REDUCTION INTERNAL FIXATION (ORIF) ANKLE FRACTURE;  Surgeon: Shona Needles, MD;  Location: [place];  Service: Orthopedics;  Laterality: Right;   ORIF FEMUR FRACTURE Right 05/16/2019   Procedure: OPEN REDUCTION INTERNAL FIXATION (ORIF) DISTAL FEMUR FRACTURE;  Surgeon: Shona Needles, MD;  Location: [place];  Service: Orthopedics;  Laterality: Right;   SHOULDER SURGERY     Left shoulder manipulation with subcrominal decompression, capsulitis   TOTAL KNEE ARTHROPLASTY  2003   Left   TOTAL KNEE ARTHROPLASTY  2007   Right    Family History  Problem Relation Age of Onset   Heart disease Mother    Hypertension Mother    Cervical cancer Mother    Esophageal cancer Father    Hypertension Sister     Social History   Socioeconomic  History   Marital status: Divorced    Spouse name: Not on file   Number of children: Not on file   Years of education: Not on file   Highest education level: Not on file  Occupational History   Occupation: disabled    Employer: UNEMPLOYED  Tobacco Use   Smoking status: Former    Types: Cigarettes    Quit date: 04/10/1988    Years since quitting: 32.6   Smokeless tobacco: Former   Tobacco comments:    Minimal prior use  Vaping Use   Vaping Use: Never used  Substance and Sexual Activity   Alcohol use: No   Drug use: No   Sexual activity: Not Currently    Birth control/protection: Surgical    Comment: hyst  Other Topics Concern   Not on file  Social History Narrative   Divorced   Resides with daughter, also has 2 sons   No regular exercise   Social Determinants of Health   Financial Resource Strain: Low Risk    Difficulty of Paying Living Expenses: Not hard at all  Food Insecurity: No Food Insecurity   Worried About Charity fundraiser in the Last Year: Never true   [place] in the Last Year: Never true  Transportation Needs: No Transportation Needs  Lack of Transportation (Medical): No   Lack of Transportation (Non-Medical): No  Physical Activity: Inactive   Days of Exercise per Week: 0 days   Minutes of Exercise per Session: 0 min  Stress: No Stress Concern Present   Feeling of Stress : Not at all  Social Connections: Moderately Isolated   Frequency of Communication with Friends and Family: More than three times a week   Frequency of Social Gatherings with Friends and Family: More than three times a week   Attends Religious Services: 1 to 4 times per year   Active Member of [employer] or Organizations: No   Attends Archivist Meetings: Never   Marital Status: Divorced  Human resources officer Violence: Not At Risk   Fear of Current or Ex-Partner: No   Emotionally Abused: No   Physically Abused: No   Sexually Abused: No    Outpatient Medications Prior to  Visit  Medication Sig Dispense Refill   [START ON 12/03/2020] ALPRAZolam (XANAX) 0.5 MG tablet TAKE 1 TABLET BY MOUTH TWICE DAILY AS NEEDED FOR ANXIETY 60 tablet 0   amLODipine (NORVASC) 5 MG tablet Take 1 tablet (5 mg total) by mouth daily. (Patient taking differently: Take 5 mg by mouth in the morning.) 90 tablet 1   Blood Glucose Monitoring Suppl (ACCU-CHEK GUIDE) w/Device KIT USE TO TEST BLOOD GLUCOSE TWICE DAILY AS DIRECTED. 1 kit 0   Cholecalciferol (VITAMIN D-3) 125 MCG (5000 UT) TABS Take 5,000 Units by mouth in the morning.     colesevelam (WELCHOL) 625 MG tablet Take 1,875 mg by mouth 2 (two) times daily as needed (irritable bowel syndrome).     dicyclomine (BENTYL) 10 MG capsule Take 10 mg by mouth daily as needed (IBS).     docusate sodium (COLACE) 100 MG capsule Take 100 mg by mouth 2 (two) times daily as needed (constipation).     escitalopram (LEXAPRO) 20 MG tablet TAKE ONE TABLET BY MOUTH ONCE DAILY. 90 tablet 3   glipiZIDE (GLUCOTROL XL) 10 MG 24 hr tablet TAKE 1 TABLET BY MOUTH ONCE A DAY. 90 tablet 0   glucose blood (ACCU-CHEK GUIDE) test strip TESTING TWO TIMES DAILY. Dx E11.65 100 strip 1   HYDROcodone-acetaminophen (NORCO) 10-325 MG tablet TAKE 1 TABLET BY MOUTH FOUR TIMES DAILY AS NEEDED. 120 tablet 0   lisinopril (ZESTRIL) 10 MG tablet Take 1 tablet (10 mg total) by mouth daily. (Patient taking differently: Take 10 mg by mouth in the morning.) 90 tablet 3   loratadine (CLARITIN) 10 MG tablet Take 10 mg by mouth daily as needed for allergies.     metFORMIN (GLUCOPHAGE) 1000 MG tablet TAKE ONE TABLET BY MOUTH TWICE A DAY WITH A MEAL (Patient taking differently: Take 1,000 mg by mouth in the morning and at bedtime.) 180 tablet 0   Multiple Vitamin (MULTIVITAMIN WITH MINERALS) TABS tablet Take 1 tablet by mouth daily. One A Day for Women     Polyethyl Glycol-Propyl Glycol (LUBRICANT EYE DROPS) 0.4-0.3 % SOLN Place 1-2 drops into both eyes 3 (three) times daily as needed  (dry/irritated/allergy eyes).     simvastatin (ZOCOR) 40 MG tablet TAKE 1 TABLET BY MOUTH ONCE DAILY. 30 tablet 0   TRULICITY 3 WG/9.5AO SOPN INJECT 3 MG INTO THE SKIN AS DIRECTED ONLY WEEKLY. 2 mL 1   bismuth subsalicylate (PEPTO BISMOL) 262 MG/15ML suspension Take 30 mLs by mouth every 6 (six) hours as needed (ibs/diarrhea).     QUEtiapine (SEROQUEL) 50 MG tablet Take 50 mg by  mouth at bedtime as needed (sleep). (Patient not taking: Reported on 12/02/2020)     No facility-administered medications prior to visit.    Allergies  Allergen Reactions   Peanut-Containing Drug Products Swelling    Review of Systems  Constitutional: Negative.   Respiratory: Negative.    Cardiovascular: Negative.   Musculoskeletal:  Positive for back pain.  Psychiatric/Behavioral:  Positive for dysphoric mood. Negative for self-injury and suicidal ideas.       Objective:    Physical Exam Constitutional:      Appearance: Normal appearance.  Cardiovascular:     Rate and Rhythm: Normal rate and regular rhythm.     Pulses: Normal pulses.     Heart sounds: Normal heart sounds.  Pulmonary:     Effort: Pulmonary effort is normal.     Breath sounds: Normal breath sounds.  Neurological:     Mental Status: She is alert.  Psychiatric:        Behavior: Behavior normal.        Thought Content: Thought content normal.        Judgment: Judgment normal.     Comments: PHQ-9 = 8; depressed mood    BP 127/64 (BP Location: Left Arm, Patient Position: Sitting, Cuff Size: Large)   Pulse 86   Ht 5' 9" (1.753 m)   Wt 198 lb (89.8 kg)   SpO2 92%   BMI 29.24 kg/m  Wt Readings from Last 3 Encounters:  12/02/20 198 lb (89.8 kg)  11/11/20 199 lb (90.3 kg)  09/20/20 208 lb 12.8 oz (94.7 kg)    Health Maintenance Due  Topic Date Due   FOOT EXAM  Never done   OPHTHALMOLOGY EXAM  Never done   DEXA SCAN  Never done   INFLUENZA VACCINE  11/08/2020    There are no preventive care reminders to display for this  patient.   Lab Results  Component Value Date   TSH 0.391 (L) 09/10/2020   Lab Results  Component Value Date   WBC 6.3 08/11/2020   HGB 11.3 08/11/2020   HCT 34.5 08/11/2020   MCV 91 08/11/2020   PLT 397 08/11/2020   Lab Results  Component Value Date   NA 141 09/10/2020   K 4.6 09/10/2020   CO2 26 09/10/2020   GLUCOSE 198 (H) 09/10/2020   BUN 9 09/10/2020   CREATININE 0.59 09/10/2020   BILITOT 0.3 09/10/2020   ALKPHOS 107 09/10/2020   AST 22 09/10/2020   ALT 22 09/10/2020   PROT 7.0 09/10/2020   ALBUMIN 4.7 09/10/2020   CALCIUM 10.5 (H) 09/10/2020   ANIONGAP 10 05/15/2019   EGFR 97 09/10/2020   Lab Results  Component Value Date   CHOL 151 08/11/2020   Lab Results  Component Value Date   HDL 61 08/11/2020   Lab Results  Component Value Date   LDLCALC 72 08/11/2020   Lab Results  Component Value Date   TRIG 98 08/11/2020   No results found for: [facility] Lab Results  Component Value Date   HGBA1C 8.3 (A) 09/20/2020       Assessment & Plan:   Problem List Items Addressed This Visit       Cardiovascular and Mediastinum   Essential hypertension, benign    BP Readings from Last 3 Encounters:  12/02/20 127/64  11/03/20 (!) 108/47  09/20/20 (!) 143/76  BP is great      Relevant Orders   CBC with Differential/Platelet   Lipid Panel With LDL/HDL Ratio  CMP14+EGFR     Endocrine   Uncontrolled type 2 diabetes mellitus with hyperglycemia (HCC)    -check A1c      Relevant Orders   Lipid Panel With LDL/HDL Ratio   CMP14+EGFR   Hemoglobin A1c     Other   Mixed hyperlipidemia    -labs ordered      Class 1 obesity due to excess calories with serious comorbidity and body mass index (BMI) of 31.0 to 31.9 in adult    BMI Readings from Last 3 Encounters:  12/02/20 29.24 kg/m  11/11/20 29.39 kg/m  11/03/20 30.83 kg/m   Wt Readings from Last 3 Encounters:  12/02/20 198 lb (89.8 kg)  11/11/20 199 lb (90.3 kg)  09/20/20 208 lb 12.8 oz (94.7  kg)        Chronic back pain    -ongoing -has been taking opioids for years -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    Noreene Larsson, NP

## 2020-12-07 ENCOUNTER — Other Ambulatory Visit: Payer: Self-pay | Admitting: Nurse Practitioner

## 2020-12-17 ENCOUNTER — Other Ambulatory Visit: Payer: Self-pay

## 2020-12-17 ENCOUNTER — Ambulatory Visit (INDEPENDENT_AMBULATORY_CARE_PROVIDER_SITE_OTHER): Payer: Medicare Other

## 2020-12-17 DIAGNOSIS — Z111 Encounter for screening for respiratory tuberculosis: Secondary | ICD-10-CM | POA: Diagnosis not present

## 2020-12-17 DIAGNOSIS — I10 Essential (primary) hypertension: Secondary | ICD-10-CM | POA: Diagnosis not present

## 2020-12-17 DIAGNOSIS — E1165 Type 2 diabetes mellitus with hyperglycemia: Secondary | ICD-10-CM | POA: Diagnosis not present

## 2020-12-18 LAB — LIPID PANEL WITH LDL/HDL RATIO
Cholesterol, Total: 147 mg/dL (ref 100–199)
HDL: 55 mg/dL (ref >39)
LDL Chol Calc (NIH): 75 mg/dL (ref 0–99)
LDL/HDL Ratio: 1.4 ratio (ref 0.0–3.2)
Triglycerides: 93 mg/dL (ref 0–149)
VLDL Cholesterol Cal: 17 mg/dL (ref 5–40)

## 2020-12-18 LAB — CBC WITH DIFFERENTIAL/PLATELET
Basophils Absolute: 0 10*3/uL (ref 0.0–0.2)
Basos: 0 %
EOS (ABSOLUTE): 0.1 10*3/uL (ref 0.0–0.4)
Eos: 1 %
Hematocrit: 36.6 % (ref 34.0–46.6)
Hemoglobin: 11.9 g/dL (ref 11.1–15.9)
Immature Grans (Abs): 0 10*3/uL (ref 0.0–0.1)
Immature Granulocytes: 0 %
Lymphocytes Absolute: 2.2 10*3/uL (ref 0.7–3.1)
Lymphs: 31 %
MCH: 29.2 pg (ref 26.6–33.0)
MCHC: 32.5 g/dL (ref 31.5–35.7)
MCV: 90 fL (ref 79–97)
Monocytes Absolute: 0.3 10*3/uL (ref 0.1–0.9)
Monocytes: 5 %
Neutrophils Absolute: 4.3 10*3/uL (ref 1.4–7.0)
Neutrophils: 63 %
Platelets: 353 10*3/uL (ref 150–450)
RBC: 4.07 x10E6/uL (ref 3.77–5.28)
RDW: 10.8 % — ABNORMAL LOW (ref 11.7–15.4)
WBC: 6.9 10*3/uL (ref 3.4–10.8)

## 2020-12-18 LAB — CMP14+EGFR
ALT: 22 IU/L (ref 0–32)
AST: 20 IU/L (ref 0–40)
Albumin/Globulin Ratio: 2.2 (ref 1.2–2.2)
Albumin: 4.7 g/dL (ref 3.8–4.8)
Alkaline Phosphatase: 116 IU/L (ref 44–121)
BUN/Creatinine Ratio: 20 (ref 12–28)
BUN: 11 mg/dL (ref 8–27)
Bilirubin Total: 0.3 mg/dL (ref 0.0–1.2)
CO2: 27 mmol/L (ref 20–29)
Calcium: 10.5 mg/dL — ABNORMAL HIGH (ref 8.7–10.3)
Chloride: 99 mmol/L (ref 96–106)
Creatinine, Ser: 0.55 mg/dL — ABNORMAL LOW (ref 0.57–1.00)
Globulin, Total: 2.1 g/dL (ref 1.5–4.5)
Glucose: 201 mg/dL — ABNORMAL HIGH (ref 65–99)
Potassium: 4.2 mmol/L (ref 3.5–5.2)
Sodium: 141 mmol/L (ref 134–144)
Total Protein: 6.8 g/dL (ref 6.0–8.5)
eGFR: 99 mL/min/{1.73_m2} (ref >59)

## 2020-12-18 LAB — HEMOGLOBIN A1C
Est. average glucose Bld gHb Est-mCnc: 171 mg/dL
Hgb A1c MFr Bld: 7.6 % — ABNORMAL HIGH (ref 4.8–5.6)

## 2020-12-20 LAB — TB SKIN TEST
Induration: 0 mm
TB Skin Test: NEGATIVE

## 2020-12-20 NOTE — Progress Notes (Signed)
A1c is 7.6, but she sees Dr. Fransico Him for this. Otherwise, labs are stable.

## 2020-12-21 ENCOUNTER — Telehealth: Payer: Self-pay | Admitting: *Deleted

## 2020-12-21 NOTE — Chronic Care Management (AMB) (Signed)
  Chronic Care Management   Outreach Note  12/21/2020 Name: Melissa Tucker MRN: [id number] DOB: [birth year]  Melissa Tucker is a 70 y.o. year old female who is a primary care patient of Melissa Roberts, NP. I reached out to Melissa Tucker by phone today in response to a referral sent by Melissa Tucker's PCP, Melissa Roberts, NP      An unsuccessful telephone outreach was attempted today. The patient was referred to the case management team for assistance with care management and care coordination.   Follow Up Plan: A HIPAA compliant phone message was left for the patient providing contact information and requesting a return call. The care management team will reach out to the patient again over the next 7 days.  If patient returns call to provider office, please advise to call Embedded Care Management Care Guide Melissa Tucker*at [phone].  Gwenevere Ghazi  Care Guide, Embedded Care Coordination [facility] Management  Direct Dial: [phone]

## 2020-12-22 ENCOUNTER — Ambulatory Visit: Payer: Medicare Other | Admitting: "Endocrinology

## 2020-12-27 ENCOUNTER — Other Ambulatory Visit: Payer: Self-pay | Admitting: Adult Health

## 2020-12-27 ENCOUNTER — Other Ambulatory Visit: Payer: Self-pay | Admitting: Nurse Practitioner

## 2020-12-28 ENCOUNTER — Other Ambulatory Visit: Payer: Self-pay | Admitting: Nurse Practitioner

## 2020-12-28 NOTE — Chronic Care Management (AMB) (Signed)
  Chronic Care Management   Note  12/28/2020 Name: Melissa Tucker MRN: [id number] DOB: [birth year]  Melissa Tucker is a 70 y.o. year old female who is a primary care patient of Noreene Larsson, NP. I reached out to Ulyses Jarred by phone today in response to a referral sent by [name]'s PCP, Noreene Larsson, NP.      Melissa Tucker was given information about Chronic Care Management services today including:  CCM service includes personalized support from designated clinical staff supervised by her physician, including individualized plan of care and coordination with other care providers 24/7 contact phone numbers for assistance for urgent and routine care needs. Service will only be billed when office clinical staff spend 20 minutes or more in a month to coordinate care. Only one practitioner may furnish and bill the service in a calendar month. The patient may stop CCM services at any time (effective at the end of the month) by phone call to the office staff. The patient will be responsible for cost sharing (co-pay) of up to 20% of the service fee (after annual deductible is met).  Patient agreed to services and verbal consent obtained.   Follow up plan: Telephone appointment with care management team member scheduled for:01/11/21  [facility] [place] Management  Direct Dial: [phone]

## 2021-01-03 ENCOUNTER — Telehealth: Payer: Self-pay | Admitting: Nurse Practitioner

## 2021-01-03 NOTE — Telephone Encounter (Signed)
Pt would  like to have Ev[facility] called in for Hot flashes

## 2021-01-04 ENCOUNTER — Ambulatory Visit (INDEPENDENT_AMBULATORY_CARE_PROVIDER_SITE_OTHER): Payer: Medicare Other | Admitting: "Endocrinology

## 2021-01-04 ENCOUNTER — Encounter: Payer: Self-pay | Admitting: "Endocrinology

## 2021-01-04 ENCOUNTER — Other Ambulatory Visit: Payer: Self-pay

## 2021-01-04 VITALS — BP 130/62 | HR 76 | Ht 69.0 in | Wt 204.2 lb

## 2021-01-04 DIAGNOSIS — E782 Mixed hyperlipidemia: Secondary | ICD-10-CM

## 2021-01-04 DIAGNOSIS — I10 Essential (primary) hypertension: Secondary | ICD-10-CM

## 2021-01-04 DIAGNOSIS — E1165 Type 2 diabetes mellitus with hyperglycemia: Secondary | ICD-10-CM | POA: Diagnosis not present

## 2021-01-04 NOTE — Progress Notes (Signed)
01/04/2021, 5:26 PM        Endocrinology follow-up note   Subjective:    Patient ID: Melissa Tucker, female    DOB: 12-10-1950.  BRIGET SHAHEED is seen in follow-up for management of currently uncontrolled symptomatic diabetes, hyperlipidemia, hypertension requested by  Noreene Larsson, NP.   Past Medical History:  Diagnosis Date   Anxiety and depression    Chest tightness    DEEP VENOUS THROMBOPHLEBITIS 10/05/2009   Qualifier: History of  By: Lattie Haw, MD, Claud Kelp    Diabetes mellitus 1994   No insulin   DJD (degenerative joint disease)    History of DVT (deep vein thrombosis) 1989   Following childbirth   Hyperlipidemia    Hypertension    IBS (irritable bowel syndrome)    Rotator cuff syndrome of right shoulder 08/22/2011   Past Surgical History:  Procedure Laterality Date   ABDOMINAL HYSTERECTOMY     CESAREAN SECTION  1988   CHOLECYSTECTOMY     COLONOSCOPY  2005   Normal findings   COLONOSCOPY WITH PROPOFOL N/A 11/03/2020   Procedure: COLONOSCOPY WITH PROPOFOL;  Surgeon: Harvel Quale, MD;  [facility];  Service: Gastroenterology;  Laterality: N/A;  8:30   JOINT REPLACEMENT N/A    Phreesia 02/28/2020   ORIF ANKLE FRACTURE Right 05/16/2019   Procedure: OPEN REDUCTION INTERNAL FIXATION (ORIF) ANKLE FRACTURE;  Surgeon: Shona Needles, MD;  Location: [facility];  Service: Orthopedics;  Laterality: Right;   ORIF FEMUR FRACTURE Right 05/16/2019   Procedure: OPEN REDUCTION INTERNAL FIXATION (ORIF) DISTAL FEMUR FRACTURE;  Surgeon: Shona Needles, MD;  Location: [facility];  Service: Orthopedics;  Laterality: Right;   SHOULDER SURGERY     Left shoulder manipulation with subcrominal decompression, capsulitis   TOTAL KNEE ARTHROPLASTY  2003   Left   TOTAL KNEE ARTHROPLASTY  2007   Right   Social History   Socioeconomic History   Marital status: Divorced    Spouse name: Not on file   Number of children: Not on file   Years of  education: Not on file   Highest education level: Not on file  Occupational History   Occupation: disabled    Employer: UNEMPLOYED  Tobacco Use   Smoking status: Former    Types: Cigarettes    Quit date: 04/10/1988    Years since quitting: 32.7   Smokeless tobacco: Former   Tobacco comments:    Minimal prior use  Vaping Use   Vaping Use: Never used  Substance and Sexual Activity   Alcohol use: No   Drug use: No   Sexual activity: Not Currently    Birth control/protection: Surgical    Comment: hyst  Other Topics Concern   Not on file  Social History Narrative   Divorced   Resides with daughter, also has 2 sons   No regular exercise   Social Determinants of Health   Financial Resource Strain: Low Risk    Difficulty of Paying Living Expenses: Not hard at all  Food Insecurity: No Food Insecurity   Worried About Charity fundraiser in the Last Year: Never true   [place] in the Last Year: Never true  Transportation Needs: No Transportation Needs   Lack of Transportation (Medical): No   Lack of Transportation (Non-Medical): No  Physical Activity: Inactive   Days of Exercise per Week: 0 days   Minutes of Exercise per Session:  0 min  Stress: No Stress Concern Present   Feeling of Stress : Not at all  Social Connections: Moderately Isolated   Frequency of Communication with Friends and Family: More than three times a week   Frequency of Social Gatherings with Friends and Family: More than three times a week   Attends Religious Services: 1 to 4 times per year   Active Member of [employer] or Organizations: No   Attends Archivist Meetings: Never   Marital Status: Divorced   Outpatient Encounter Medications as of 01/04/2021  Medication Sig   ALPRAZolam (XANAX) 0.5 MG tablet TAKE 1 TABLET BY MOUTH TWICE DAILY AS NEEDED FOR ANXIETY   amLODipine (NORVASC) 5 MG tablet Take 1 tablet (5 mg total) by mouth daily. (Patient taking differently: Take 5 mg by mouth in the  morning.)   Blood Glucose Monitoring Suppl (ACCU-CHEK GUIDE) w/Device KIT USE TO TEST BLOOD GLUCOSE TWICE DAILY AS DIRECTED.   Cholecalciferol (VITAMIN D-3) 125 MCG (5000 UT) TABS Take 5,000 Units by mouth in the morning.   colesevelam (WELCHOL) 625 MG tablet Take 1,875 mg by mouth 2 (two) times daily as needed (irritable bowel syndrome).   dicyclomine (BENTYL) 10 MG capsule Take 10 mg by mouth daily as needed (IBS).   docusate sodium (COLACE) 100 MG capsule Take 100 mg by mouth 2 (two) times daily as needed (constipation).   escitalopram (LEXAPRO) 20 MG tablet TAKE ONE TABLET BY MOUTH ONCE DAILY.   glipiZIDE (GLUCOTROL XL) 10 MG 24 hr tablet TAKE 1 TABLET BY MOUTH ONCE A DAY.   glucose blood (ACCU-CHEK GUIDE) test strip TESTING TWO TIMES DAILY. Dx E11.65   HYDROcodone-acetaminophen (NORCO) 10-325 MG tablet TAKE 1 TABLET BY MOUTH FOUR TIMES DAILY AS NEEDED.   lisinopril (ZESTRIL) 10 MG tablet Take 1 tablet (10 mg total) by mouth daily. (Patient taking differently: Take 10 mg by mouth in the morning.)   loratadine (CLARITIN) 10 MG tablet Take 10 mg by mouth daily as needed for allergies.   metFORMIN (GLUCOPHAGE) 1000 MG tablet TAKE ONE TABLET BY MOUTH TWICE A DAY WITH A MEAL (Patient taking differently: Take 1,000 mg by mouth in the morning and at bedtime.)   Multiple Vitamin (MULTIVITAMIN WITH MINERALS) TABS tablet Take 1 tablet by mouth daily. One A Day for Women   Polyethyl Glycol-Propyl Glycol (LUBRICANT EYE DROPS) 0.4-0.3 % SOLN Place 1-2 drops into both eyes 3 (three) times daily as needed (dry/irritated/allergy eyes).   simvastatin (ZOCOR) 40 MG tablet TAKE 1 TABLET BY MOUTH ONCE DAILY.   TRULICITY 3 TS/1.7BL SOPN INJECT 3 MG INTO THE SKIN AS DIRECTED ONLY WEEKLY.   No facility-administered encounter medications on file as of 01/04/2021.    ALLERGIES: Allergies  Allergen Reactions   Peanut-Containing Drug Products Swelling    VACCINATION STATUS: Immunization History  Administered  Date(s) Administered   Fluad Quad(high Dose 65+) 03/09/2020   PFIZER(Purple Top)SARS-COV-2 Vaccination 06/18/2019, 07/09/2019   PPD Test 12/17/2020   Pneumococcal Conjugate-13 03/09/2020    Diabetes She presents for her follow-up diabetic visit. She has type 2 diabetes mellitus. Onset time: She was diagnosed at approximate age of 8 years. Her disease course has been improving. There are no hypoglycemic associated symptoms. Pertinent negatives for hypoglycemia include no confusion, headaches, pallor or seizures. Pertinent negatives for diabetes include no blurred vision, no chest pain, no fatigue, no polydipsia, no polyphagia and no polyuria. There are no hypoglycemic complications. Symptoms are improving. Diabetic complications include retinopathy. Risk factors for coronary artery  disease include diabetes mellitus, dyslipidemia, family history, hypertension, obesity, sedentary lifestyle, post-menopausal and tobacco exposure. Current diabetic treatments: She is currently on Invokana 300 mg p.o. daily metformin/glipizide,\ She is compliant with treatment some of the time. Her weight is decreasing steadily (She lost approximately 10 pounds since last visit.). She is following a generally unhealthy diet. When asked about meal planning, she reported none. She has not had a previous visit with a dietitian. She never participates in exercise. Her home blood glucose trend is decreasing steadily. Her breakfast blood glucose range is generally 140-180 mg/dl. Her bedtime blood glucose range is generally 180-200 mg/dl. Her overall blood glucose range is 140-180 mg/dl. (She presents with improved glycemic profile.  Her previsit labs show A1c of 7.6%, overall improving.  She did not document any major hypoglycemia.    ) An ACE inhibitor/angiotensin II receptor blocker is being taken. Eye exam is current.  Hyperlipidemia This is a chronic problem. The current episode started more than 1 year ago. The problem is  controlled. Exacerbating diseases include diabetes and obesity. Pertinent negatives include no chest pain, myalgias or shortness of breath. Current antihyperlipidemic treatment includes statins and ezetimibe. Risk factors for coronary artery disease include dyslipidemia, diabetes mellitus, family history, obesity, hypertension, a sedentary lifestyle and post-menopausal.  Hypertension This is a chronic problem. The current episode started more than 1 year ago. The problem is uncontrolled. Pertinent negatives include no blurred vision, chest pain, headaches, palpitations or shortness of breath. Risk factors for coronary artery disease include diabetes mellitus, dyslipidemia, family history, obesity, post-menopausal state, sedentary lifestyle and smoking/tobacco exposure. Past treatments include ACE inhibitors. Hypertensive end-organ damage includes retinopathy.   Review of systems Limited as above.  Objective:    BP 130/62   Pulse 76   Ht _0  (1.753 m)   Wt 204 lb 3.2 oz (92.6 kg)   BMI 30.16 kg/m   Wt Readings from Last 3 Encounters:  01/04/21 204 lb 3.2 oz (92.6 kg)  12/02/20 198 lb (89.8 kg)  11/11/20 199 lb (90.3 kg)      Physical Exam- Limited  CMP     Component Value Date/Time   NA 141 12/17/2020 1113   K 4.2 12/17/2020 1113   CL 99 12/17/2020 1113   CO2 27 12/17/2020 1113   GLUCOSE 201 (H) 12/17/2020 1113   GLUCOSE 281 (H) 05/15/2019 1505   BUN 11 12/17/2020 1113   CREATININE 0.55 (L) 12/17/2020 1113   CREATININE 0.64 08/14/2018 1142   CALCIUM 10.5 (H) 12/17/2020 1113   PROT 6.8 12/17/2020 1113   ALBUMIN 4.7 12/17/2020 1113   AST 20 12/17/2020 1113   ALT 22 12/17/2020 1113   ALKPHOS 116 12/17/2020 1113   BILITOT 0.3 12/17/2020 1113   GFRNONAA 99 03/09/2020 1400   GFRNONAA 92 08/14/2018 1142   GFRAA 114 03/09/2020 1400   GFRAA 107 08/14/2018 1142     Diabetic Labs (most recent): Lab Results  Component Value Date   HGBA1C 7.6 (H) 12/17/2020   HGBA1C 8.3 (A)  09/20/2020   HGBA1C 8.0 (A) 06/15/2020     Lipid Panel ( most recent) Lipid Panel     Component Value Date/Time   CHOL 147 12/17/2020 1113   TRIG 93 12/17/2020 1113   HDL 55 12/17/2020 1113   LDLCALC 75 12/17/2020 1113     Assessment & Plan:   1. Uncontrolled type 2 diabetes mellitus with hyperglycemia ([place])  - IRLANDA CROGHAN has currently uncontrolled symptomatic type 2 DM since 70 years  of age.  She presents with improved glycemic profile.  Her previsit labs show A1c of 7.6%, overall improving.  She did not document any major hypoglycemia.     -her diabetes is complicated by retinopathy, obesity/sedentary life, history of smoking and MILICENT ACHEAMPONG remains at a high risk for more acute and chronic complications which include CAD, CVA, CKD, retinopathy, and neuropathy. These are all discussed in detail with the patient.  - I have counseled her on diet management and weight loss, by adopting a carbohydrate restricted/protein rich diet.  - she acknowledges that there is a room for improvement in her food and drink choices. - Suggestion is made for her to avoid simple carbohydrates  from her diet including Cakes, Sweet Desserts, Ice Cream, Soda (diet and regular), Sweet Tea, Candies, Chips, Cookies, Store Bought Juices, Alcohol in Excess of  1-2 drinks a day, Artificial Sweeteners,  Coffee Creamer, and "Sugar-free" Products, Lemonade. This will help patient to have more stable blood glucose profile and potentially avoid unintended weight gain.   - I encouraged her to switch to  unprocessed or minimally processed complex starch and increased protein intake (animal or plant source), fruits, and vegetables.  - she is advised to stick to a routine mealtimes to eat 3 meals  a day and avoid unnecessary snacks ( to snack only to correct hypoglycemia).   - I have approached her with the following individualized plan to manage diabetes and patient agrees:   -In light of her reported glycemic  profile at fasting between 119-170, she will not be initiated on insulin treatment.  She wishes to avoid insulin treatment at this time.    -She has tolerated Trulicity at 3 mg subcutaneously weekly.  I advised her to continue Trulicity 3 mg subcutaneously weekly, metformin 1000 mg p.o. twice daily, glipizide 10 mg p.o. daily.  She is advised and willing to continue monitoring blood glucose twice a day-daily before breakfast and at bedtime.    -She is encouraged to call clinic for blood glucose readings less than 70 or greater than 200x3.     2) BP/HTN:  -Her blood pressure is not controlled to target.  She is advised to continue her current medications including lisinopril 40 mg p.o. daily, Lasix 40 mg p.o. daily . She will be considered for additional treatment with hydrochlorothiazide on her next visit.   3) Lipids/HPL: Her recent lipid panel showed controlled LDL at 65.  She is advised to continue simvastatin 40 mg p.o. nightly.   Side effects precautions discussed with her.      4)  Weight/Diet: Her BMI 30.1-  She is a candidate for modest weight loss.  CDE Consult will be initiated , exercise, and detailed carbohydrates information provided.  5) Chronic Care/Health Maintenance:  -she  is on ACEI and Statin medications and  is encouraged to initiate and continue to follow up with Ophthalmology, Dentist,  Podiatrist at least yearly or according to recommendations, and advised to  stay away from smoking. I have recommended yearly flu vaccine and pneumonia vaccine at least every 5 years; moderate intensity exercise for up to 150 minutes weekly; and  sleep for at least 7 hours a day.  She did have normal screening ABI on May 11, 2020.  This study will be repeated in February 2027, or sooner if needed.  - I advised patient to maintain close follow up with Noreene Larsson, NP for primary care needs.    I spent 32 minutes in the  care of the patient today including review of labs from  [facility], Lipids, Thyroid Function, Hematology (current and previous including abstractions from other facilities); face-to-face time discussing  her blood glucose readings/logs, discussing hypoglycemia and hyperglycemia episodes and symptoms, medications doses, her options of short and long term treatment based on the latest standards of care / guidelines;  discussion about incorporating lifestyle medicine;  and documenting the encounter.    Please refer to Patient Instructions for Blood Glucose Monitoring and Insulin/Medications Dosing Guide"  in media tab for additional information. Please  also refer to " Patient Self Inventory" in the Media  tab for reviewed elements of pertinent patient history.  Ulyses Jarred participated in the discussions, expressed understanding, and voiced agreement with the above plans.  All questions were answered to her satisfaction. she is encouraged to contact clinic should she have any questions or concerns prior to her return visit.  Follow up plan: - Return in about 6 months (around 07/04/2021) for Bring Meter and Logs- A1c in Office.  Glade Lloyd, MD [facility] [address] [place], [place] [address] Phone: [phone]  Fax: [phone]    01/04/2021, 5:26 PM  This note was partially dictated with voice recognition software. Similar sounding words can be transcribed inadequately or may not  be corrected upon review.

## 2021-01-04 NOTE — Patient Instructions (Signed)

## 2021-01-04 NOTE — Telephone Encounter (Signed)
She needs one with GYN.

## 2021-01-04 NOTE — Telephone Encounter (Signed)
Left message

## 2021-01-06 ENCOUNTER — Other Ambulatory Visit: Payer: Self-pay | Admitting: "Endocrinology

## 2021-01-09 NOTE — Progress Notes (Signed)
[facility] Follow Up Assessment  MRN: [id number] NAME: Melissa Tucker Date: 11/17/20  Start time: 12 End time: 1215 Total time: 15  Type of Contact: Follow up Call  Current concerns/stressors: sadness  Screens/Assessment Tools: PHQ9 SCORE ONLY 12/02/2020 11/11/2020 10/04/2020  PHQ-9 Total Score _0 Functional Assessment:  Sleep: poor Appetite: fair Coping ability: fair Patient taking medications as prescribed:    Current medications:  Outpatient Encounter Medications as of 11/17/2020  Medication Sig   amLODipine (NORVASC) 5 MG tablet Take 1 tablet (5 mg total) by mouth daily. (Patient taking differently: Take 5 mg by mouth in the morning.)   Blood Glucose Monitoring Suppl (ACCU-CHEK GUIDE) w/Device KIT USE TO TEST BLOOD GLUCOSE TWICE DAILY AS DIRECTED.   Cholecalciferol (VITAMIN D-3) 125 MCG (5000 UT) TABS Take 5,000 Units by mouth in the morning.   colesevelam (WELCHOL) 625 MG tablet Take 1,875 mg by mouth 2 (two) times daily as needed (irritable bowel syndrome).   dicyclomine (BENTYL) 10 MG capsule Take 10 mg by mouth daily as needed (IBS).   docusate sodium (COLACE) 100 MG capsule Take 100 mg by mouth 2 (two) times daily as needed (constipation).   escitalopram (LEXAPRO) 20 MG tablet TAKE ONE TABLET BY MOUTH ONCE DAILY.   glipiZIDE (GLUCOTROL XL) 10 MG 24 hr tablet TAKE 1 TABLET BY MOUTH ONCE A DAY.   glucose blood (ACCU-CHEK GUIDE) test strip TESTING TWO TIMES DAILY. Dx E11.65   lisinopril (ZESTRIL) 10 MG tablet Take 1 tablet (10 mg total) by mouth daily. (Patient taking differently: Take 10 mg by mouth in the morning.)   loratadine (CLARITIN) 10 MG tablet Take 10 mg by mouth daily as needed for allergies.   Multiple Vitamin (MULTIVITAMIN WITH MINERALS) TABS tablet Take 1 tablet by mouth daily. One A Day for Women   Polyethyl Glycol-Propyl Glycol (LUBRICANT EYE DROPS) 0.4-0.3 % SOLN Place 1-2 drops into both eyes 3 (three) times daily as needed  (dry/irritated/allergy eyes).   [DISCONTINUED] ALPRAZolam (XANAX) 0.5 MG tablet TAKE 1 TABLET BY MOUTH TWICE DAILY AS NEEDED FOR ANXIETY   [DISCONTINUED] bismuth subsalicylate (PEPTO BISMOL) 262 MG/15ML suspension Take 30 mLs by mouth every 6 (six) hours as needed (ibs/diarrhea).   [DISCONTINUED] Chlorphen-PE-Acetaminophen (NOREL AD) 4-10-325 MG TABS Take 1 tablet by mouth every 4 (four) hours as needed (nasal congestion, cold symptoms).   [DISCONTINUED] Dulaglutide (TRULICITY) 3 HU/7.6LY SOPN Inject 3 mg as directed once a week. (Patient taking differently: Inject 3 mg as directed every Tuesday.)   [DISCONTINUED] HYDROcodone-acetaminophen ([place]) 10-325 MG tablet TAKE 1 TABLET BY MOUTH FOUR TIMES DAILY AS NEEDED. (Patient taking differently: Take 1 tablet by mouth 4 (four) times daily as needed (pain).)   [DISCONTINUED] metFORMIN (GLUCOPHAGE) 1000 MG tablet TAKE ONE TABLET BY MOUTH TWICE A DAY WITH A MEAL (Patient taking differently: Take 1,000 mg by mouth in the morning and at bedtime.)   [DISCONTINUED] QUEtiapine (SEROQUEL) 50 MG tablet Take 50 mg by mouth at bedtime as needed (sleep). (Patient not taking: Reported on 12/02/2020)   [DISCONTINUED] simvastatin (ZOCOR) 40 MG tablet TAKE 1 TABLET BY MOUTH ONCE DAILY.   No facility-administered encounter medications on file as of 11/17/2020.    Self-harm and/or Suicidal Behaviors Risk Assessment Self-harm risk factors: no Patient endorses recent self injurious thoughts and/or behaviors: No   Suicide ideations: No plan to harm self or others   Danger to Others Risk Assessment Danger to others risk factors: no Patient endorses recent  thoughts of harming others: No    Substance Use Assessment Patient recently consumed alcohol: No  Patient recently used drugs: No  Patient is concerned about dependence or abuse of substances: No    Goals, Interventions and Follow-up Plan Goals: Increase healthy adjustment to current life  circumstances Interventions: Mindfulness or Relaxation Training   Summary of Melissa Tucker is a 70 yr old woman referred by her PCP for anxiety and depression. Provided psychoeducation on her DX.  Discussed behavioral activation and the importance of mood tracking in order to help reduce symptoms.    Follow-up Plan:  biweekly vbh session [name], LCSW

## 2021-01-10 ENCOUNTER — Other Ambulatory Visit: Payer: Self-pay | Admitting: Nurse Practitioner

## 2021-01-13 ENCOUNTER — Telehealth: Payer: Self-pay | Admitting: *Deleted

## 2021-01-13 ENCOUNTER — Telehealth: Payer: Medicare Other

## 2021-01-13 ENCOUNTER — Other Ambulatory Visit: Payer: Self-pay | Admitting: "Endocrinology

## 2021-01-13 NOTE — Telephone Encounter (Signed)
  Care Management   Follow Up Note   01/13/2021 Name: Melissa Tucker MRN: [id number] DOB: 05-Feb-1951   Referred by: Heather Roberts, NP Reason for referral : Chronic Care Management (HTN, DM2)   An unsuccessful telephone outreach was attempted today. The patient was referred to the case management team for assistance with care management and care coordination.   Follow Up Plan: Telephone follow up appointment with care management team member scheduled for:  upon care guide rescheduling.  Irving Shows [facility], BSN RN Case Manager [facility] [phone]

## 2021-01-14 ENCOUNTER — Ambulatory Visit (INDEPENDENT_AMBULATORY_CARE_PROVIDER_SITE_OTHER): Payer: Medicare Other | Admitting: Adult Health

## 2021-01-14 ENCOUNTER — Other Ambulatory Visit: Payer: Self-pay

## 2021-01-14 ENCOUNTER — Encounter: Payer: Self-pay | Admitting: Adult Health

## 2021-01-14 VITALS — BP 132/73 | HR 104 | Ht 69.0 in | Wt 204.0 lb

## 2021-01-14 DIAGNOSIS — N949 Unspecified condition associated with female genital organs and menstrual cycle: Secondary | ICD-10-CM

## 2021-01-14 DIAGNOSIS — L900 Lichen sclerosus et atrophicus: Secondary | ICD-10-CM | POA: Diagnosis not present

## 2021-01-14 DIAGNOSIS — R232 Flushing: Secondary | ICD-10-CM | POA: Diagnosis not present

## 2021-01-14 MED ORDER — CLOBETASOL PROPIONATE 0.05 % EX CREA: TOPICAL_CREAM | CUTANEOUS | 3 refills | Status: DC

## 2021-01-14 MED ORDER — HYDROXYZINE HCL 10 MG PO TABS: 10.0000 mg | ORAL_TABLET | Freq: Three times a day (TID) | ORAL | 2 refills | Status: DC | PRN

## 2021-01-14 NOTE — Progress Notes (Signed)
  Subjective:     Patient ID: Melissa Tucker, female   DOB: 02-28-1951, 70 y.o.   MRN: [id number]  HPI [name] is a 70 year old black female,divorced, sp hysterectomy, in complaining of burning in vaginal area and hot flashes, she took OTC meds, not sure of name it helped some with hot flashes. She has history of DVT. PCP is Laury Axon NP.  Review of Systems Burning in vaginal area +hot flashes She is not sexually active Reviewed past medical,surgical, social and family history. Reviewed medications and allergies.     Objective:   Physical Exam BP 132/73 (BP Location: Right Arm, Patient Position: Sitting, Cuff Size: Normal)   Pulse (!) 104   Ht 5\' 9"  (1.753 m)   Wt 204 lb (92.5 kg)   BMI 30.13 kg/m     Skin warm and dry.Pelvic: external genitalia is normal in appearance no lesions, vagina: pale, has this pink irregular tissue at introitus, like lichen sclerosus,urethra has no lesions or masses noted, cervix and uterus are absent,adnexa: no masses or tenderness noted. Bladder is non tender and no masses felt.   [place] - 01/14/21 1155       Pregnancy Intention Screening   Does the patient want to become pregnant in the next year? N/A    Does the patient's partner want to become pregnant in the next year? N/A    Would the patient like to discuss contraceptive options today? No      Contraception Wrap Up   Current Method Female Sterilization   hyst   End Method Female Sterilization   hyst   Contraception Counseling Provided No            Examination chaperoned by 03/16/21.  Assessment:     1. Vaginal discomfort  2. Hot flashes Can not use estrogen due to DVT, she is on lexapro Will try vistaril, and stop Claritin for now Meds ordered this encounter  Medications   clobetasol cream (TEMOVATE) 0.05 %    Sig: Use bid x 2 weeks in affected areas then use 2-3 x a week    Dispense:  45 g    Refill:  3    Order Specific Question:   Supervising Provider    Answer:   [employer] H [[id number]]   hydrOXYzine (ATARAX/VISTARIL) 10 MG tablet    Sig: Take 1 tablet (10 mg total) by mouth 3 (three) times daily as needed.    Dispense:  30 tablet    Refill:  2    Order Specific Question:   Supervising Provider    Answer:   Duane Lope, LUTHER H [[id number]]     3. Lichen sclerosus et atrophicus Will rx temovate Showed her pictures in [facility]     Plan:     Will recheck in 6 weeks

## 2021-01-18 ENCOUNTER — Telehealth: Payer: Self-pay | Admitting: *Deleted

## 2021-01-18 NOTE — Chronic Care Management (AMB) (Signed)
  Care Management   Note  01/18/2021 Name: Melissa Tucker MRN: [id number] DOB: 08/23/1950  Melissa Tucker is a 70 y.o. year old female who is a primary care patient of Heather Roberts, NP and is actively engaged with the care management team. I reached out to Ricardo Jericho by phone today to assist with re-scheduling an initial visit with the RN Case Manager  Follow up plan: Unsuccessful telephone outreach attempt made. A HIPAA compliant phone message was left for the patient providing contact information and requesting a return call.  The care management team will reach out to the patient again over the next 7 days.  If patient returns call to provider office, please advise to call Embedded Care Management Care Guide Misty Stanley at [phone]  [facility] Guide, Embedded Care Coordination [facility]  Care Management  Direct Dial: [phone]

## 2021-01-18 NOTE — Chronic Care Management (AMB) (Signed)
  Care Management   Note  01/18/2021 Name: Melissa Tucker MRN: [id number] DOB: 08-07-1950  Melissa Tucker is a 70 y.o. year old female who is a primary care patient of Heather Roberts, NP and is actively engaged with the care management team. I reached out to Ricardo Jericho by phone today to assist with re-scheduling an initial visit with the RN Case Manager  Follow up plan: Telephone appointment with care management team member scheduled for:02/10/21  [facility] Guide, Embedded Care Coordination [facility]  Care Management  Direct Dial: [phone]

## 2021-01-19 ENCOUNTER — Telehealth (INDEPENDENT_AMBULATORY_CARE_PROVIDER_SITE_OTHER): Payer: Medicare Other | Admitting: Licensed Clinical Social Worker

## 2021-01-19 DIAGNOSIS — F411 Generalized anxiety disorder: Secondary | ICD-10-CM

## 2021-01-19 NOTE — Telephone Encounter (Signed)
Contacted Patient to ensure that she has been contacted by the agency that I referred  Encouraged a call back on vmail

## 2021-01-24 ENCOUNTER — Other Ambulatory Visit: Payer: Self-pay | Admitting: Nurse Practitioner

## 2021-01-24 DIAGNOSIS — J209 Acute bronchitis, unspecified: Secondary | ICD-10-CM | POA: Diagnosis not present

## 2021-01-25 ENCOUNTER — Other Ambulatory Visit: Payer: Self-pay | Admitting: Nurse Practitioner

## 2021-01-28 ENCOUNTER — Encounter: Payer: Self-pay | Admitting: Family Medicine

## 2021-01-28 ENCOUNTER — Telehealth: Payer: Self-pay | Admitting: Nurse Practitioner

## 2021-01-28 ENCOUNTER — Ambulatory Visit (INDEPENDENT_AMBULATORY_CARE_PROVIDER_SITE_OTHER): Payer: Medicare Other | Admitting: Family Medicine

## 2021-01-28 ENCOUNTER — Other Ambulatory Visit: Payer: Self-pay

## 2021-01-28 VITALS — BP 170/74 | HR 104 | Resp 18 | Ht 69.0 in | Wt 201.1 lb

## 2021-01-28 DIAGNOSIS — F339 Major depressive disorder, recurrent, unspecified: Secondary | ICD-10-CM | POA: Diagnosis not present

## 2021-01-28 DIAGNOSIS — I10 Essential (primary) hypertension: Secondary | ICD-10-CM | POA: Diagnosis not present

## 2021-01-28 DIAGNOSIS — F411 Generalized anxiety disorder: Secondary | ICD-10-CM

## 2021-01-28 DIAGNOSIS — E1165 Type 2 diabetes mellitus with hyperglycemia: Secondary | ICD-10-CM

## 2021-01-28 MED ORDER — ALPRAZOLAM 0.5 MG PO TABS: 0.5000 mg | ORAL_TABLET | Freq: Two times a day (BID) | ORAL | 0 refills | Status: DC

## 2021-01-28 NOTE — Telephone Encounter (Signed)
Pt called  in for refill on nerves meds  Need sent to [facility]

## 2021-01-28 NOTE — Patient Instructions (Addendum)
Follow-up in office with MD Wallace Cullens primary provider in 2 to 3 weeks.  Call if you need to be seen sooner.  Nurse visit in 1 week for flu vaccine.  Microalbumin to be submitted today.  Medication is prescribed for 1 month as discussed.  Your left ear shows no sign of infection and your lungs are clear.  I am concerned about your blood pressure this will be reevaluated at your next visit also.  Thanks for choosing [facility], we consider it a privelige to serve you.

## 2021-01-28 NOTE — Telephone Encounter (Signed)
Pt here for visit 

## 2021-01-30 LAB — MICROALBUMIN / CREATININE URINE RATIO
Creatinine, Urine: 81.5 mg/dL
Microalb/Creat Ratio: 143 mg/g creat — ABNORMAL HIGH (ref 0–29)
Microalbumin, Urine: 116.3 ug/mL

## 2021-01-31 ENCOUNTER — Encounter: Payer: Self-pay | Admitting: Family Medicine

## 2021-01-31 NOTE — Assessment & Plan Note (Signed)
Not suicidal or homicidal, may benefit from therapy and / or med adjustment, will f/u with PCP regarding this

## 2021-01-31 NOTE — Progress Notes (Signed)
Melissa Tucker     MRN: [id number]      DOB: Nov 07, 1950   HPI Melissa Tucker is here with a 4 day h/o left ear pain, was started on antibiotics, pain is less ,but also has c/o chest congestion with no productive cough  Ear pain is improved and so has the cough with medication that was prescribed C/o anxiety, and needs medication refilled  ROS Denies recent fever or chills.  Denies chest pains, palpitations and leg swelling Denies abdominal pain, nausea, vomiting,diarrhea or constipation.   Denies dysuria, frequency, hesitancy or incontinence. Denies joint pain, swelling and limitation in mobility. Denies headaches, seizures, numbness, or tingling. C/o  depression and anxiety or insomnia. Denies skin break down or rash.   PE  BP (!) 170/74   Pulse (!) 104   Resp 18   Ht 5\' 9"  (1.753 m)   Wt 201 lb 1.9 oz (91.2 kg)   SpO2 94%   BMI 29.70 kg/m   Patient alert and oriented and in no cardiopulmonary distress.  HEENT: No facial asymmetry, EOMI,     Neck supple .TM clear bilaterally  Chest: Clear to auscultation bilaterally.  CVS: S1, S2 no murmurs, no S3.Regular rate.  ABD: Soft non tender.   Ext: No edema  MS: Adequate ROM spine, shoulders, hips and knees.  Skin: Intact, no ulcerations or rash noted.  Psych: Good eye contact, . Memory intact mildly  anxious and depressed appearing.  CNS: CN 2-12 intact, power,  normal throughout.no focal deficits noted.   Assessment & Plan Essential hypertension, benign DASH diet and commitment to daily physical activity for a minimum of 30 minutes discussed and encouraged, as a part of hypertension management. The importance of attaining a healthy weight is also discussed.  BP/Weight 01/28/2021 01/14/2021 01/04/2021 12/02/2020 11/11/2020 11/03/2020 10/04/2020  Systolic BP 170 132 130 127 - 10/06/2020 -  Diastolic BP 74 73 62 64 - 47 -  Wt. (Lbs) 201.12 204 204.2 198 199 - -  BMI 29.7 30.13 30.16 29.24 29.39 30.83 -     Elevated at visit, to  be re eval in 2 to 3 weeks  Uncontrolled type 2 diabetes mellitus with hyperglycemia (HCC) Melissa Tucker is reminded of the importance of commitment to daily physical activity for 30 minutes or more, as able and the need to limit carbohydrate intake to 30 to 60 grams per meal to help with blood sugar control.   The need to take medication as prescribed, test blood sugar as directed, and to call between visits if there is a concern that blood sugar is uncontrolled is also discussed.   Melissa Tucker is reminded of the importance of daily foot exam, annual eye examination, and good blood sugar, blood pressure and cholesterol control.  Diabetic Labs Latest Ref Rng & Units 01/28/2021 12/17/2020 09/20/2020 09/10/2020 08/11/2020  HbA1c 4.8 - 5.6 % - 7.6(H) 8.3(A) - -  Micro/Creat Ratio 0 - 29 mg/g creat 143(H) - - - -  Chol 100 - 199 mg/dL - 10/11/2020 - - 034  HDL 742 mg/dL - 55 - - 61  Calc LDL 0 - 99 mg/dL - 75 - - 72  Triglycerides 0 - 149 mg/dL - 93 - - 98  Creatinine 0.57 - 1.00 mg/dL - >59) - 5.63(O 7.56   BP/Weight 01/28/2021 01/14/2021 01/04/2021 12/02/2020 11/11/2020 11/03/2020 10/04/2020  Systolic BP 170 132 130 127 - 10/06/2020 -  Diastolic BP 74 73 62 64 - 47 -  Wt. (Lbs) 201.12 204  204.2 198 199 - -  BMI 29.7 30.13 30.16 29.24 29.39 30.83 -   Foot/eye exam completion dates 01/28/2021  Foot Form Completion Done        Depression, recurrent (HCC) Not suicidal or homicidal, may benefit from therapy and / or med adjustment, will f/u with PCP regarding this  Anxiety state Uncontrolled, medication prescribed for 1 month with f/u planned with PCP

## 2021-01-31 NOTE — Assessment & Plan Note (Signed)
Melissa Tucker is reminded of the importance of commitment to daily physical activity for 30 minutes or more, as able and the need to limit carbohydrate intake to 30 to 60 grams per meal to help with blood sugar control.   The need to take medication as prescribed, test blood sugar as directed, and to call between visits if there is a concern that blood sugar is uncontrolled is also discussed.   Melissa Tucker is reminded of the importance of daily foot exam, annual eye examination, and good blood sugar, blood pressure and cholesterol control.  Diabetic Labs Latest Ref Rng & Units 01/28/2021 12/17/2020 09/20/2020 09/10/2020 08/11/2020  HbA1c 4.8 - 5.6 % - 7.6(H) 8.3(A) - -  Micro/Creat Ratio 0 - 29 mg/g creat 143(H) - - - -  Chol 100 - 199 mg/dL - 017 - - 793  HDL >90 mg/dL - 55 - - 61  Calc LDL 0 - 99 mg/dL - 75 - - 72  Triglycerides 0 - 149 mg/dL - 93 - - 98  Creatinine 0.57 - 1.00 mg/dL - 3.00(P) - 2.33 0.07   BP/Weight 01/28/2021 01/14/2021 01/04/2021 12/02/2020 11/11/2020 11/03/2020 10/04/2020  Systolic BP 170 132 130 127 - 622 -  Diastolic BP 74 73 62 64 - 47 -  Wt. (Lbs) 201.12 204 204.2 198 199 - -  BMI 29.7 30.13 30.16 29.24 29.39 30.83 -   Foot/eye exam completion dates 01/28/2021  Foot Form Completion Done

## 2021-01-31 NOTE — Assessment & Plan Note (Signed)
Uncontrolled, medication prescribed for 1 month with f/u planned with PCP

## 2021-01-31 NOTE — Assessment & Plan Note (Signed)
DASH diet and commitment to daily physical activity for a minimum of 30 minutes discussed and encouraged, as a part of hypertension management. The importance of attaining a healthy weight is also discussed.  BP/Weight 01/28/2021 01/14/2021 01/04/2021 12/02/2020 11/11/2020 11/03/2020 10/04/2020  Systolic BP 170 132 130 127 - 562 -  Diastolic BP 74 73 62 64 - 47 -  Wt. (Lbs) 201.12 204 204.2 198 199 - -  BMI 29.7 30.13 30.16 29.24 29.39 30.83 -     Elevated at visit, to be re eval in 2 to 3 weeks

## 2021-02-01 ENCOUNTER — Telehealth: Payer: Self-pay

## 2021-02-01 ENCOUNTER — Other Ambulatory Visit: Payer: Self-pay | Admitting: Family Medicine

## 2021-02-01 DIAGNOSIS — R051 Acute cough: Secondary | ICD-10-CM

## 2021-02-01 NOTE — Telephone Encounter (Signed)
Pt states that she feels real bad, no fever or chills.  Pt been taking tylenol.  Pt is coughing up mucous but it is clear.  Pt states no appetite. Pt says she does not have a regular cough, she feels like she has a fever but she doesn't.

## 2021-02-01 NOTE — Telephone Encounter (Signed)
Pt states there is no way she can come to the office today or get a chest xray done.  She states she has no ride.  She will be here tomorrow and also get xray tomorrow.

## 2021-02-01 NOTE — Telephone Encounter (Signed)
Patient called Melissa Tucker she spoke to Dr Lodema Hong last week and she still having a fever at times. Patient ask for nurse to give her a call.

## 2021-02-02 ENCOUNTER — Other Ambulatory Visit (INDEPENDENT_AMBULATORY_CARE_PROVIDER_SITE_OTHER): Payer: Medicare Other

## 2021-02-02 ENCOUNTER — Other Ambulatory Visit: Payer: Self-pay

## 2021-02-02 ENCOUNTER — Ambulatory Visit: Payer: Medicare Other

## 2021-02-02 ENCOUNTER — Ambulatory Visit (HOSPITAL_COMMUNITY)
Admission: RE | Admit: 2021-02-02 | Discharge: 2021-02-02 | Disposition: A | Discharge status: Home / Self Care | Payer: Medicare Other | Source: Ambulatory Visit | Attending: Family Medicine | Admitting: Family Medicine

## 2021-02-02 DIAGNOSIS — I10 Essential (primary) hypertension: Secondary | ICD-10-CM | POA: Diagnosis not present

## 2021-02-02 DIAGNOSIS — R051 Acute cough: Secondary | ICD-10-CM | POA: Diagnosis not present

## 2021-02-02 DIAGNOSIS — R6889 Other general symptoms and signs: Secondary | ICD-10-CM | POA: Diagnosis not present

## 2021-02-02 DIAGNOSIS — Z20822 Contact with and (suspected) exposure to covid-19: Secondary | ICD-10-CM

## 2021-02-02 DIAGNOSIS — R0602 Shortness of breath: Secondary | ICD-10-CM | POA: Diagnosis not present

## 2021-02-02 DIAGNOSIS — J9811 Atelectasis: Secondary | ICD-10-CM | POA: Diagnosis not present

## 2021-02-02 DIAGNOSIS — R059 Cough, unspecified: Secondary | ICD-10-CM | POA: Diagnosis not present

## 2021-02-02 LAB — POCT INFLUENZA A/B
Influenza A, POC: NEGATIVE
Influenza B, POC: NEGATIVE

## 2021-02-04 ENCOUNTER — Ambulatory Visit: Payer: Medicare Other | Admitting: Physical Medicine & Rehabilitation

## 2021-02-04 LAB — SARS-COV-2, NAA 2 DAY TAT

## 2021-02-04 LAB — NOVEL CORONAVIRUS, NAA: SARS-CoV-2, NAA: NOT DETECTED

## 2021-02-07 ENCOUNTER — Other Ambulatory Visit: Payer: Self-pay | Admitting: Nurse Practitioner

## 2021-02-07 DIAGNOSIS — I10 Essential (primary) hypertension: Secondary | ICD-10-CM

## 2021-02-08 ENCOUNTER — Other Ambulatory Visit: Payer: Self-pay | Admitting: Family Medicine

## 2021-02-08 MED ORDER — BENZONATATE 100 MG PO CAPS: 100.0000 mg | ORAL_CAPSULE | Freq: Two times a day (BID) | ORAL | 0 refills | Status: DC | PRN

## 2021-02-08 NOTE — Progress Notes (Signed)
Tessalon perle 

## 2021-02-10 ENCOUNTER — Telehealth: Payer: Medicare Other

## 2021-02-10 ENCOUNTER — Other Ambulatory Visit: Payer: Self-pay | Admitting: "Endocrinology

## 2021-02-21 ENCOUNTER — Other Ambulatory Visit: Payer: Self-pay | Admitting: Internal Medicine

## 2021-02-21 DIAGNOSIS — M545 Low back pain, unspecified: Secondary | ICD-10-CM

## 2021-02-21 MED ORDER — HYDROCODONE-ACETAMINOPHEN 10-325 MG PO TABS: 1.0000 | ORAL_TABLET | Freq: Four times a day (QID) | ORAL | 0 refills | Status: DC | PRN

## 2021-02-25 ENCOUNTER — Other Ambulatory Visit: Payer: Self-pay | Admitting: Family Medicine

## 2021-02-25 ENCOUNTER — Telehealth: Payer: Self-pay | Admitting: Nurse Practitioner

## 2021-02-25 ENCOUNTER — Other Ambulatory Visit: Payer: Self-pay

## 2021-02-25 ENCOUNTER — Ambulatory Visit (INDEPENDENT_AMBULATORY_CARE_PROVIDER_SITE_OTHER): Payer: Medicare Other | Admitting: Adult Health

## 2021-02-25 ENCOUNTER — Encounter: Payer: Self-pay | Admitting: Adult Health

## 2021-02-25 VITALS — BP 139/71 | HR 97 | Ht 69.0 in | Wt 204.0 lb

## 2021-02-25 DIAGNOSIS — L900 Lichen sclerosus et atrophicus: Secondary | ICD-10-CM

## 2021-02-25 DIAGNOSIS — R232 Flushing: Secondary | ICD-10-CM

## 2021-02-25 MED ORDER — ALPRAZOLAM 0.5 MG PO TABS: 0.5000 mg | ORAL_TABLET | Freq: Two times a day (BID) | ORAL | 0 refills | Status: DC

## 2021-02-25 NOTE — Progress Notes (Signed)
  Subjective:     Patient ID: Melissa Tucker, female   DOB: 1950-12-21, 70 y.o.   MRN: [id number]  HPI Melissa Tucker is a 70 year old black female,divorced, sp hysterectomy, back in follow up on starting temovate and has had no itching, feels better and vistaril has helped hot flashes some.  Review of Systems Feels better, no itching  Still has hot flashes but better Reviewed past medical,surgical, social and family history. Reviewed medications and allergies.     Objective:   Physical Exam BP 139/71 (BP Location: Left Arm, Patient Position: Sitting, Cuff Size: Normal)   Pulse 97   Ht 5\' 9"  (1.753 m)   Wt 204 lb (92.5 kg)   BMI 30.13 kg/m     Skin warm and dry.Pelvic: external genitalia is normal in appearance no lesions, vagina: area at introitus is no longer cracked or as red. Examination chaperoned by RN  Fall risk is low Depression screen [facility] 2/9 02/25/2021 01/28/2021 12/02/2020  Decreased Interest 0 3 2  Down, Depressed, Hopeless 0 3 2  PHQ - 2 Score 0 6 4  Altered sleeping - 0 1  Tired, decreased energy - 0 1  Change in appetite - 1 1  Feeling bad or failure about yourself  - 0 1  Trouble concentrating - 0 0  Moving slowly or fidgety/restless - 0 0  Suicidal thoughts - 0 0  PHQ-9 Score - 7 8  Difficult doing work/chores - - Not difficult at all     Upstream - 02/25/21 1232       Pregnancy Intention Screening   Does the patient want to become pregnant in the next year? No    Does the patient's partner want to become pregnant in the next year? No    Would the patient like to discuss contraceptive options today? No      Contraception Wrap Up   Current Method No Method - Other Reason   hyst   End Method No Method - Other Reason   hyst   Contraception Counseling Provided No             Assessment:       1. Lichen sclerosus et atrophicus -continue temovate, has refills   2. Hot flashes Vistaril has helped, has refills  Plan:     Follow up in 4 months

## 2021-02-25 NOTE — Telephone Encounter (Signed)
Pt called in for refill on    ALPRAZolam (XANAX) 0.5 MG tablet  Pt states that pharm has faxed over paper work

## 2021-03-04 ENCOUNTER — Ambulatory Visit: Payer: Medicare Other | Admitting: Nurse Practitioner

## 2021-03-07 ENCOUNTER — Other Ambulatory Visit: Payer: Self-pay | Admitting: "Endocrinology

## 2021-03-11 ENCOUNTER — Ambulatory Visit: Payer: Medicare Other | Admitting: Nurse Practitioner

## 2021-03-14 ENCOUNTER — Ambulatory Visit: Payer: Medicare Other | Admitting: Nurse Practitioner

## 2021-03-15 ENCOUNTER — Ambulatory Visit: Payer: Medicare Other | Admitting: *Deleted

## 2021-03-15 DIAGNOSIS — E1165 Type 2 diabetes mellitus with hyperglycemia: Secondary | ICD-10-CM

## 2021-03-15 DIAGNOSIS — I10 Essential (primary) hypertension: Secondary | ICD-10-CM

## 2021-03-15 NOTE — Patient Instructions (Signed)
Visit Information   Thank you for taking time to visit with me today. Please don't hesitate to contact me if I can be of assistance to you before our next scheduled telephone appointment.  Following are the goals we discussed today:   DASH Eating Plan DASH stands for Dietary Approaches to Stop Hypertension. The DASH eating plan is a healthy eating plan that has been shown to: Reduce high blood pressure (hypertension). Reduce your risk for type 2 diabetes, heart disease, and stroke. Help with weight loss. What are tips for following this plan? Reading food labels Check food labels for the amount of salt (sodium) per serving. Choose foods with less than 5 percent of the Daily Value of sodium. Generally, foods with less than 300 milligrams (mg) of sodium per serving fit into this eating plan. To find whole grains, look for the word "whole" as the first word in the ingredient list. Shopping Buy products labeled as "low-sodium" or "no salt added." Buy fresh foods. Avoid canned foods and pre-made or frozen meals. Cooking Avoid adding salt when cooking. Use salt-free seasonings or herbs instead of table salt or sea salt. Check with your health care provider or pharmacist before using salt substitutes. Do not fry foods. Cook foods using healthy methods such as baking, boiling, grilling, roasting, and broiling instead. Cook with heart-healthy oils, such as olive, canola, avocado, soybean, or sunflower oil. Meal planning  Eat a balanced diet that includes: 4 or more servings of fruits and 4 or more servings of vegetables each day. Try to fill one-half of your plate with fruits and vegetables. 6-8 servings of whole grains each day. Less than 6 oz (170 g) of lean meat, poultry, or fish each day. A 3-oz (85-g) serving of meat is about the same size as a deck of cards. One egg equals 1 oz (28 g). 2-3 servings of low-fat dairy each day. One serving is 1 cup (237 mL). 1 serving of nuts, seeds, or beans  5 times each week. 2-3 servings of heart-healthy fats. Healthy fats called omega-3 fatty acids are found in foods such as walnuts, flaxseeds, fortified milks, and eggs. These fats are also found in cold-water fish, such as sardines, salmon, and mackerel. Limit how much you eat of: Canned or prepackaged foods. Food that is high in trans fat, such as some fried foods. Food that is high in saturated fat, such as fatty meat. Desserts and other sweets, sugary drinks, and other foods with added sugar. Full-fat dairy products. Do not salt foods before eating. Do not eat more than 4 egg yolks a week. Try to eat at least 2 vegetarian meals a week. Eat more home-cooked food and less restaurant, buffet, and fast food. Lifestyle When eating at a restaurant, ask that your food be prepared with less salt or no salt, if possible. If you drink alcohol: Limit how much you use to: 0-1 drink a day for women who are not pregnant. 0-2 drinks a day for men. Be aware of how much alcohol is in your drink. In the [place] one drink equals one 12 oz bottle of beer (355 mL), one 5 oz glass of wine (148 mL), or one 1 oz glass of hard liquor (44 mL). General information Avoid eating more than 2,300 mg of salt a day. If you have hypertension, you may need to reduce your sodium intake to 1,500 mg a day. Work with your health care provider to maintain a healthy body weight or to lose weight. Ask  what an ideal weight is for you. Get at least 30 minutes of exercise that causes your heart to beat faster (aerobic exercise) most days of the week. Activities may include walking, swimming, or biking. Work with your health care provider or dietitian to adjust your eating plan to your individual calorie needs. What foods should I eat? Fruits All fresh, dried, or frozen fruit. Canned fruit in natural juice (without added sugar). Vegetables Fresh or frozen vegetables (raw, steamed, roasted, or grilled). Low-sodium or reduced-sodium  tomato and vegetable juice. Low-sodium or reduced-sodium tomato sauce and tomato paste. Low-sodium or reduced-sodium canned vegetables. Grains Whole-grain or whole-wheat bread. Whole-grain or whole-wheat pasta. Brown rice. [place]. Bulgur. Whole-grain and low-sodium cereals. Pita bread. Low-fat, low-sodium crackers. Whole-wheat flour tortillas. Meats and other proteins Skinless chicken or [place]. Ground chicken or [place]. Pork with fat trimmed off. Fish and seafood. Egg whites. Dried beans, peas, or lentils. Unsalted nuts, nut butters, and seeds. Unsalted canned beans. Lean cuts of beef with fat trimmed off. Low-sodium, lean precooked or cured meat, such as sausages or meat loaves. Dairy Low-fat (1%) or fat-free (skim) milk. Reduced-fat, low-fat, or fat-free cheeses. Nonfat, low-sodium ricotta or cottage cheese. Low-fat or nonfat yogurt. Low-fat, low-sodium cheese. Fats and oils Soft margarine without trans fats. Vegetable oil. Reduced-fat, low-fat, or light mayonnaise and salad dressings (reduced-sodium). Canola, safflower, olive, avocado, soybean, and sunflower oils. Avocado. Seasonings and condiments Herbs. Spices. Seasoning mixes without salt. Other foods Unsalted popcorn and pretzels. Fat-free sweets. The items listed above may not be a complete list of foods and beverages you can eat. Contact a dietitian for more information. What foods should I avoid? Fruits Canned fruit in a light or heavy syrup. Fried fruit. Fruit in cream or butter sauce. Vegetables Creamed or fried vegetables. Vegetables in a cheese sauce. Regular canned vegetables (not low-sodium or reduced-sodium). Regular canned tomato sauce and paste (not low-sodium or reduced-sodium). Regular tomato and vegetable juice (not low-sodium or reduced-sodium). Angie Fava. Olives. Grains Baked goods made with fat, such as croissants, muffins, or some breads. Dry pasta or rice meal packs. Meats and other proteins Fatty cuts of  meat. Ribs. Fried meat. Berniece Salines. Bologna, salami, and other precooked or cured meats, such as sausages or meat loaves. Fat from the back of a pig (fatback). Bratwurst. Salted nuts and seeds. Canned beans with added salt. Canned or smoked fish. Whole eggs or egg yolks. Chicken or [place] with skin. Dairy Whole or 2% milk, cream, and half-and-half. Whole or full-fat cream cheese. Whole-fat or sweetened yogurt. Full-fat cheese. Nondairy creamers. Whipped toppings. Processed cheese and cheese spreads. Fats and oils Butter. Stick margarine. Lard. Shortening. Ghee. Bacon fat. Tropical oils, such as coconut, palm kernel, or palm oil. Seasonings and condiments Onion salt, garlic salt, seasoned salt, table salt, and sea salt. Worcestershire sauce. Tartar sauce. Barbecue sauce. Teriyaki sauce. Soy sauce, including reduced-sodium. Steak sauce. Canned and packaged gravies. Fish sauce. Oyster sauce. Cocktail sauce. Store-bought horseradish. Ketchup. Mustard. Meat flavorings and tenderizers. Bouillon cubes. Hot sauces. Pre-made or packaged marinades. Pre-made or packaged taco seasonings. Relishes. Regular salad dressings. Other foods Salted popcorn and pretzels. The items listed above may not be a complete list of foods and beverages you should avoid. Contact a dietitian for more information. Where to find more information National Heart, Lung, and Blood Institute: https://wilson-eaton.com/ American Heart Association: www.heart.org Academy of Nutrition and Dietetics: www.eatright.[place]: www.kidney.org Summary The DASH eating plan is a healthy eating plan that has been shown to reduce  high blood pressure (hypertension). It may also reduce your risk for type 2 diabetes, heart disease, and stroke. When on the DASH eating plan, aim to eat more fresh fruits and vegetables, whole grains, lean proteins, low-fat dairy, and heart-healthy fats. With the DASH eating plan, you should limit salt (sodium) intake  to 2,300 mg a day. If you have hypertension, you may need to reduce your sodium intake to 1,500 mg a day. Work with your health care provider or dietitian to adjust your eating plan to your individual calorie needs. This information is not intended to replace advice given to you by your health care provider. Make sure you discuss any questions you have with your health care provider. Document Revised: 02/28/2019 Document Reviewed: 02/28/2019 Elsevier Patient Education  2022 [place]. Hypoglycemia Hypoglycemia is when the sugar (glucose) level in your blood is too low. Low blood sugar can happen to people who have diabetes and people who do not have diabetes. Low blood sugar can happen quickly, and it can be an emergency. What are the causes? This condition happens most often in people who have diabetes. It may be caused by: Diabetes medicine. Not eating enough, or not eating often enough. Doing more physical activity. Drinking alcohol on an empty stomach. If you do not have diabetes, this condition may be caused by: A tumor in the pancreas. Not eating enough, or not eating for long periods at a time (fasting). A very bad infection or illness. Problems after having weight loss (bariatric) surgery. Kidney failure or liver failure. Certain medicines. What increases the risk? This condition is more likely to develop in people who: Have diabetes and take medicines to lower their blood sugar. Abuse alcohol. Have a very bad illness. What are the signs or symptoms? Mild Hunger. Sweating and feeling clammy. Feeling dizzy or light-headed. Being sleepy or having trouble sleeping. Feeling like you may vomit (nauseous). A fast heartbeat. A headache. Blurry vision. Mood changes, such as: Being grouchy. Feeling worried or nervous (anxious). Tingling or loss of feeling (numbness) around your mouth, lips, or tongue. Moderate Confusion and poor judgment. Behavior  changes. Weakness. Uneven heartbeat. Trouble with moving (coordination). Very low Very low blood sugar (severe hypoglycemia) is a medical emergency. It can cause: Fainting. Seizures. Loss of consciousness (coma). Death. How is this treated? Treating low blood sugar Low blood sugar is often treated by eating or drinking something that has sugar in it right away. The food or drink should contain 15 grams of a fast-acting carb (carbohydrate). Options include: 4 oz (120 mL) of fruit juice. 4 oz (120  Take medications as prescribed   Attend all scheduled provider appointments Attend church or other social activities Perform all self care activities independently  Call provider office for new concerns or questions  check blood sugar at prescribed times: once daily enter blood sugar readings and medication or insulin into daily log take the blood sugar log to all doctor visits take the blood sugar meter to all doctor visits limit fast food meals to no more than 1 per week manage portion size wash and dry feet carefully every day check blood pressure 3 times per week choose a place to take my blood pressure (home, clinic or office, retail store) write blood pressure results in a log or diary take blood pressure log to all doctor appointments eat more whole grains, fruits and vegetables, lean meats and healthy fats Look over and complete advanced directives packet mailed to you Call RN care manager  for any questions at [phone] Social worker will call you for anxiety, depression management Our next appointment is by telephone on 05/10/2021 at 330 pm  Please call the care guide team at [phone] if you need to cancel or reschedule your appointment.   If you are experiencing a Mental Health or [place] or need someone to talk to, please call the [place] National Suicide Prevention Lifeline: (978)590-6345 or TTY: 217-154-7397 TTY (585)284-7744) to talk to a trained  counselor call 1-800-273-TALK (toll free, 24 hour hotline) go to [facility] [phone]) call the [place]: [phone] call 911   Following is a copy of your full care plan:  Care Plan : RN Care Manager Plan of Care  Updates made by Kassie Mends, RN since 03/15/2021 12:00 AM     Problem: No plan of care established for management of chronic disease states  (DM2, HTN)   Priority: High     Long-Range Goal: Development of plan of care for chronic disease management  (DM2, HTN)   Start Date: 03/15/2021  Expected End Date: 09/11/2021  Priority: High  Note:   Current Barriers:  Knowledge Deficits related to plan of care for management of HTN and DMII - patient reports she and adult daughter live together, pt is independent with all aspects of her care, continues to drive, checks CBG once daily with range of 150-200 in the morning, checks CBG on occasion.  Requests advanced directives be mailed.  Agreeable with referral to CCM LCSW for anxiety, depression management (although depression screening score is PHQ2=2 today, pt reports still bothersome and anxiety can be hard to manage at times).    RNCM Clinical Goal(s):  Patient will verbalize understanding of plan for management of HTN and DMII as evidenced by patient report, review EHR and  through collaboration with RN Care manager, provider, and care team.   Interventions: 1:1 collaboration with primary care provider regarding development and update of comprehensive plan of care as evidenced by provider attestation and co-signature Inter-disciplinary care team collaboration (see longitudinal plan of care) Evaluation of current treatment plan related to  self management and patient's adherence to plan as established by provider  Diabetes Interventions:  (Status:  New goal.) Long Term Goal Assessed patient's understanding of A1c goal: <7% Provided  education to patient about basic DM disease process Reviewed medications with patient and discussed importance of medication adherence Counseled on importance of regular laboratory monitoring as prescribed Discussed plans with patient for ongoing care management follow up and provided patient with direct contact information for care management team Provided patient with written educational materials related to hypo and hyperglycemia and importance of correct treatment Referral made to social work team for assistance with anxiety, depression management Review of patient status, including review of consultants reports, relevant laboratory and other test results, and medications completed Screening for signs and symptoms of depression related to chronic disease state  Assessed social determinant of health barriers Pain assessment completed Education sent via My Chart- hypoglycemia Lab Results  Component Value Date   HGBA1C 7.6 (H) 12/17/2020   Hypertension Interventions:  (Status:  New goal.) Long Term Goal Last practice recorded BP readings:  BP Readings from Last 3 Encounters:  02/25/21 139/71  01/28/21 (!) 170/74  01/14/21 132/73  Most recent eGFR/CrCl:  Lab Results  Component Value Date   EGFR 99 12/17/2020    No components found for: CRCL  Evaluation of current treatment  plan related to hypertension self management and patient's adherence to plan as established by provider Provided education to patient re: stroke prevention, s/s of heart attack and stroke Reviewed medications with patient and discussed importance of compliance Counseled on the importance of exercise goals with target of 150 minutes per week Discussed plans with patient for ongoing care management follow up and provided patient with direct contact information for care management team Discussed complications of poorly controlled blood pressure such as heart disease, stroke, circulatory complications, vision  complications, kidney impairment, sexual dysfunction Mailed advanced directives packet Education sent via My Chart- low sodium diet  Reviewed upcoming scheduled appointments - primary care provider 03/29/21,  endocrinologist Dr. Dorris Fetch 07/06/2021  Patient Goals/Self-Care Activities: Take medications as prescribed   Attend all scheduled provider appointments Attend church or other social activities Perform all self care activities independently  Call provider office for new concerns or questions  check blood sugar at prescribed times: once daily enter blood sugar readings and medication or insulin into daily log take the blood sugar log to all doctor visits take the blood sugar meter to all doctor visits limit fast food meals to no more than 1 per week manage portion size wash and dry feet carefully every day check blood pressure 3 times per week choose a place to take my blood pressure (home, clinic or office, retail store) write blood pressure results in a log or diary take blood pressure log to all doctor appointments eat more whole grains, fruits and vegetables, lean meats and healthy fats Look over and complete advanced directives packet mailed to you Call RN care manager for any questions at [phone] Social worker will call you for anxiety, depression management   Follow Up Plan:  Telephone follow up appointment with care management team member scheduled for:  05/10/2021      Consent to CCM Services: Ms. Mellone was given information about Chronic Care Management services including:  CCM service includes personalized support from designated clinical staff supervised by her physician, including individualized plan of care and coordination with other care providers 24/7 contact phone numbers for assistance for urgent and routine care needs. Service will only be billed when office clinical staff spend 20 minutes or more in a month to coordinate care. Only one practitioner may  furnish and bill the service in a calendar month. The patient may stop CCM services at any time (effective at the end of the month) by phone call to the office staff. The patient will be responsible for cost sharing (co-pay) of up to 20% of the service fee (after annual deductible is met).  Patient agreed to services and verbal consent obtained.   Patient verbalizes understanding of instructions provided today and agrees to view in [place].   Telephone follow up appointment with care management team member scheduled for:  05/10/2021

## 2021-03-15 NOTE — Chronic Care Management (AMB) (Signed)
Chronic Care Management   CCM RN Visit Note  03/15/2021 Name: Melissa Tucker MRN: [id number] DOB: 04-26-1950  Subjective: Melissa Tucker is a 70 y.o. year old female who is a primary care patient of Noreene Larsson, NP. The care management team was consulted for assistance with disease management and care coordination needs.    Engaged with patient by telephone for initial visit in response to provider referral for case management and/or care coordination services.   Consent to Services:  The patient was given the following information about Chronic Care Management services today, agreed to services, and gave verbal consent: 1. CCM service includes personalized support from designated clinical staff supervised by the primary care provider, including individualized plan of care and coordination with other care providers 2. 24/7 contact phone numbers for assistance for urgent and routine care needs. 3. Service will only be billed when office clinical staff spend 20 minutes or more in a month to coordinate care. 4. Only one practitioner may furnish and bill the service in a calendar month. 5.The patient may stop CCM services at any time (effective at the end of the month) by phone call to the office staff. 6. The patient will be responsible for cost sharing (co-pay) of up to 20% of the service fee (after annual deductible is met). Patient agreed to services and consent obtained.  Patient agreed to services and verbal consent obtained.   Assessment: Review of patient past medical history, allergies, medications, health status, including review of consultants reports, laboratory and other test data, was performed as part of comprehensive evaluation and provision of chronic care management services.   SDOH (Social Determinants of Health) assessments and interventions performed:  SDOH Interventions    Flowsheet Row Most Recent Value  SDOH Interventions   Food Insecurity Interventions Intervention Not  Indicated  Transportation Interventions Intervention Not Indicated  Depression Interventions/Treatment  Medication        CCM Care Plan  Allergies  Allergen Reactions   Peanut-Containing Drug Products Swelling    Outpatient Encounter Medications as of 03/15/2021  Medication Sig   ALPRAZolam (XANAX) 0.5 MG tablet Take 1 tablet (0.5 mg total) by mouth 2 (two) times daily.   amLODipine (NORVASC) 5 MG tablet TAKE 1 TABLET BY MOUTH ONCE A DAY.   Blood Glucose Monitoring Suppl (ACCU-CHEK GUIDE) w/Device KIT USE TO TEST BLOOD GLUCOSE TWICE DAILY AS DIRECTED.   Cholecalciferol (VITAMIN D-3) 125 MCG (5000 UT) TABS Take 5,000 Units by mouth in the morning.   colesevelam (WELCHOL) 625 MG tablet Take 1,875 mg by mouth 2 (two) times daily as needed (irritable bowel syndrome).   dicyclomine (BENTYL) 10 MG capsule Take 10 mg by mouth daily as needed (IBS).   docusate sodium (COLACE) 100 MG capsule Take 100 mg by mouth 2 (two) times daily as needed (constipation).   escitalopram (LEXAPRO) 20 MG tablet TAKE ONE TABLET BY MOUTH ONCE DAILY.   glipiZIDE (GLUCOTROL XL) 10 MG 24 hr tablet TAKE 1 TABLET BY MOUTH ONCE A DAY.   glucose blood (ACCU-CHEK GUIDE) test strip USE TO CHECK BLOOD GLUCOSE TWICE DAILY AS DIRECTED.   HYDROcodone-acetaminophen (NORCO) 10-325 MG tablet Take 1 tablet by mouth 4 (four) times daily as needed.   hydrOXYzine (ATARAX/VISTARIL) 10 MG tablet Take 1 tablet (10 mg total) by mouth 3 (three) times daily as needed.   lisinopril (ZESTRIL) 10 MG tablet Take 1 tablet (10 mg total) by mouth daily. (Patient taking differently: Take 10 mg by mouth in the  morning.)   metFORMIN (GLUCOPHAGE) 1000 MG tablet TAKE ONE TABLET BY MOUTH TWICE A DAY WITH A MEAL   Multiple Vitamin (MULTIVITAMIN WITH MINERALS) TABS tablet Take 1 tablet by mouth daily. One A Day for Women   Polyethyl Glycol-Propyl Glycol (LUBRICANT EYE DROPS) 0.4-0.3 % SOLN Place 1-2 drops into both eyes 3 (three) times daily as needed  (dry/irritated/allergy eyes).   simvastatin (ZOCOR) 40 MG tablet TAKE 1 TABLET BY MOUTH ONCE DAILY.   TRULICITY 3 NI/7.7OE SOPN INJECT 3 MG INTO THE SKIN AS DIRECTED ONLY WEEKLY.   ALPRAZolam (XANAX) 0.5 MG tablet TAKE 1 TABLET BY MOUTH TWICE DAILY AS NEEDED FOR ANXIETY (Patient not taking: Reported on 03/15/2021)   clobetasol cream (TEMOVATE) 0.05 % Use bid x 2 weeks in affected areas then use 2-3 x a week (Patient not taking: Reported on 02/25/2021)   No facility-administered encounter medications on file as of 03/15/2021.    Patient Active Problem List   Diagnosis Date Noted   Hot flashes 01/14/2021   Vaginal discomfort 42/35/3614   Lichen sclerosus et atrophicus 01/14/2021   Long-term use of high-risk medication 08/11/2020   Chronic back pain 08/11/2020   Closed displaced trimalleolar fracture of right ankle 05/18/2019   Closed fracture of right distal femur ([place]) 05/15/2019   Personal history of noncompliance with medical treatment, presenting hazards to health 11/13/2017   Uncontrolled type 2 diabetes mellitus with hyperglycemia ([place]) 11/06/2017   Essential hypertension, benign 11/06/2017   Class 1 obesity due to excess calories with serious comorbidity and body mass index (BMI) of 31.0 to 31.9 in adult 11/06/2017   ABNORMAL CV (STRESS) TEST 12/03/2009   Mixed hyperlipidemia 10/05/2009   Anxiety state 10/05/2009   Depression, recurrent ([place]) 10/05/2009   IRRITABLE BOWEL SYNDROME 10/05/2009   DEGENERATIVE JOINT DISEASE 10/05/2009   CHEST DISCOMFORT 10/05/2009    Conditions to be addressed/monitored:HTN and DMII  Care Plan : [place] of Care  Updates made by Kassie Mends, RN since 03/15/2021 12:00 AM     Problem: No plan of care established for management of chronic disease states  (DM2, HTN)   Priority: High     Long-Range Goal: Development of plan of care for chronic disease management  (DM2, HTN)   Start Date: 03/15/2021  Expected End Date: 09/11/2021   Priority: High  Note:   Current Barriers:  Knowledge Deficits related to plan of care for management of HTN and DMII - patient reports she and adult daughter live together, pt is independent with all aspects of her care, continues to drive, checks CBG once daily with range of 150-200 in the morning, checks CBG on occasion.  Requests advanced directives be mailed.  Agreeable with referral to CCM LCSW for anxiety, depression management (although depression screening score is PHQ2=2 today, pt reports still bothersome and anxiety can be hard to manage at times).    RNCM Clinical Goal(s):  Patient will verbalize understanding of plan for management of HTN and DMII as evidenced by patient report, review EHR and  through collaboration with RN Care manager, provider, and care team.   Interventions: 1:1 collaboration with primary care provider regarding development and update of comprehensive plan of care as evidenced by provider attestation and co-signature Inter-disciplinary care team collaboration (see longitudinal plan of care) Evaluation of current treatment plan related to  self management and patient's adherence to plan as established by provider  Diabetes Interventions:  (Status:  New goal.) Long Term Goal Assessed patient's understanding of  A1c goal: <7% Provided education to patient about basic DM disease process Reviewed medications with patient and discussed importance of medication adherence Counseled on importance of regular laboratory monitoring as prescribed Discussed plans with patient for ongoing care management follow up and provided patient with direct contact information for care management team Provided patient with written educational materials related to hypo and hyperglycemia and importance of correct treatment Referral made to social work team for assistance with anxiety, depression management Review of patient status, including review of consultants reports, relevant  laboratory and other test results, and medications completed Screening for signs and symptoms of depression related to chronic disease state  Assessed social determinant of health barriers Pain assessment completed Education sent via My Chart- hypoglycemia Lab Results  Component Value Date   HGBA1C 7.6 (H) 12/17/2020   Hypertension Interventions:  (Status:  New goal.) Long Term Goal Last practice recorded BP readings:  BP Readings from Last 3 Encounters:  02/25/21 139/71  01/28/21 (!) 170/74  01/14/21 132/73  Most recent eGFR/CrCl:  Lab Results  Component Value Date   EGFR 99 12/17/2020    No components found for: CRCL  Evaluation of current treatment plan related to hypertension self management and patient's adherence to plan as established by provider Provided education to patient re: stroke prevention, s/s of heart attack and stroke Reviewed medications with patient and discussed importance of compliance Counseled on the importance of exercise goals with target of 150 minutes per week Discussed plans with patient for ongoing care management follow up and provided patient with direct contact information for care management team Discussed complications of poorly controlled blood pressure such as heart disease, stroke, circulatory complications, vision complications, kidney impairment, sexual dysfunction Mailed advanced directives packet Education sent via My Chart- low sodium diet  Reviewed upcoming scheduled appointments - primary care provider 03/29/21,  endocrinologist Dr. Dorris Fetch 07/06/2021  Patient Goals/Self-Care Activities: Take medications as prescribed   Attend all scheduled provider appointments Attend church or other social activities Perform all self care activities independently  Call provider office for new concerns or questions  check blood sugar at prescribed times: once daily enter blood sugar readings and medication or insulin into daily log take the blood sugar  log to all doctor visits take the blood sugar meter to all doctor visits limit fast food meals to no more than 1 per week manage portion size wash and dry feet carefully every day check blood pressure 3 times per week choose a place to take my blood pressure (home, clinic or office, retail store) write blood pressure results in a log or diary take blood pressure log to all doctor appointments eat more whole grains, fruits and vegetables, lean meats and healthy fats Look over and complete advanced directives packet mailed to you Call RN care manager for any questions at [phone] Social worker will call you for anxiety, depression management   Follow Up Plan:  Telephone follow up appointment with care management team member scheduled for:  05/10/2021      Plan:Telephone follow up appointment with care management team member scheduled for:  05/10/2021  Jacqlyn Larsen [employer], BSN RN Case Manager [facility] [phone]

## 2021-03-17 NOTE — Progress Notes (Signed)
Scheduled 04/07/21

## 2021-03-18 ENCOUNTER — Other Ambulatory Visit: Payer: Self-pay | Admitting: Internal Medicine

## 2021-03-18 DIAGNOSIS — M545 Low back pain, unspecified: Secondary | ICD-10-CM

## 2021-03-18 DIAGNOSIS — G8929 Other chronic pain: Secondary | ICD-10-CM

## 2021-03-21 ENCOUNTER — Other Ambulatory Visit: Payer: Self-pay | Admitting: Internal Medicine

## 2021-03-21 DIAGNOSIS — M545 Low back pain, unspecified: Secondary | ICD-10-CM

## 2021-03-24 ENCOUNTER — Ambulatory Visit: Payer: Medicare Other | Admitting: Nurse Practitioner

## 2021-03-25 ENCOUNTER — Other Ambulatory Visit: Payer: Self-pay | Admitting: Nurse Practitioner

## 2021-03-25 ENCOUNTER — Telehealth: Payer: Self-pay

## 2021-03-25 ENCOUNTER — Other Ambulatory Visit: Payer: Self-pay | Admitting: Adult Health

## 2021-03-25 DIAGNOSIS — F411 Generalized anxiety disorder: Secondary | ICD-10-CM

## 2021-03-25 MED ORDER — ALPRAZOLAM 0.5 MG PO TABS: 0.5000 mg | ORAL_TABLET | Freq: Two times a day (BID) | ORAL | 0 refills | Status: DC

## 2021-03-25 NOTE — Telephone Encounter (Signed)
Sent. I am leaving at the end of the month. She takes controlled substances so the other providers her will not likely accept her as a patient. She may want to find a different primary care office.

## 2021-03-25 NOTE — Telephone Encounter (Signed)
Pt called and asked that you call in her medication.  She said you will know what it is.

## 2021-03-29 ENCOUNTER — Other Ambulatory Visit: Payer: Self-pay | Admitting: Nurse Practitioner

## 2021-03-29 ENCOUNTER — Encounter: Payer: Self-pay | Admitting: Nurse Practitioner

## 2021-03-29 ENCOUNTER — Other Ambulatory Visit: Payer: Self-pay

## 2021-03-29 ENCOUNTER — Telehealth: Payer: Self-pay

## 2021-03-29 ENCOUNTER — Ambulatory Visit (INDEPENDENT_AMBULATORY_CARE_PROVIDER_SITE_OTHER): Payer: Medicare Other | Admitting: Nurse Practitioner

## 2021-03-29 VITALS — BP 164/66 | HR 96 | Ht 69.0 in | Wt 198.0 lb

## 2021-03-29 DIAGNOSIS — E782 Mixed hyperlipidemia: Secondary | ICD-10-CM

## 2021-03-29 DIAGNOSIS — M545 Low back pain, unspecified: Secondary | ICD-10-CM

## 2021-03-29 DIAGNOSIS — G8929 Other chronic pain: Secondary | ICD-10-CM

## 2021-03-29 DIAGNOSIS — E1165 Type 2 diabetes mellitus with hyperglycemia: Secondary | ICD-10-CM | POA: Diagnosis not present

## 2021-03-29 DIAGNOSIS — Z79899 Other long term (current) drug therapy: Secondary | ICD-10-CM | POA: Diagnosis not present

## 2021-03-29 DIAGNOSIS — I10 Essential (primary) hypertension: Secondary | ICD-10-CM

## 2021-03-29 DIAGNOSIS — F411 Generalized anxiety disorder: Secondary | ICD-10-CM

## 2021-03-29 MED ORDER — PROPRANOLOL HCL 10 MG PO TABS: 10.0000 mg | ORAL_TABLET | Freq: Three times a day (TID) | ORAL | 3 refills | Status: DC

## 2021-03-29 MED ORDER — AZITHROMYCIN 250 MG PO TABS: ORAL_TABLET | ORAL | 0 refills | Status: DC

## 2021-03-29 NOTE — Telephone Encounter (Signed)
Patient called said he spoke to Laury Axon about a Z-pak.  Pharmacy not received yet. Please contact patient about blood pressure pill Propranolol make sure she suppose to take this medication. Please have nurse give patient a call at [phone].  [place]

## 2021-03-29 NOTE — Assessment & Plan Note (Signed)
-  followed by Dr. Nida 

## 2021-03-29 NOTE — Assessment & Plan Note (Signed)
-  has been taking xanax and hydrocodone-APAP -was taking the norco with Dr. Juanetta Gosling and has been taking it for years -UDS today

## 2021-03-29 NOTE — Assessment & Plan Note (Addendum)
-  checking lipid panel -goal LDL < 70

## 2021-03-29 NOTE — Progress Notes (Signed)
Acute Office Visit  Subjective:    Patient ID: Melissa Tucker, female    DOB: 11-12-1950, 70 y.o.   MRN: [id number]  Chief Complaint  Patient presents with   Follow-up    Follow up sinus issues x 2 weeks.    HPI Patient is in today for lab follow-up.  She has yellow sputum that started 2-3 weeks ago.  She states that she has been getting nervous around 2 in the afternoon, and she is requesting more xanax. She has upcoming therapy appointment.     Past Medical History:  Diagnosis Date   Anxiety and depression    Chest tightness    DEEP VENOUS THROMBOPHLEBITIS 10/05/2009   Qualifier: History of  By: Lattie Haw, MD, Claud Kelp    Diabetes mellitus 1994   No insulin   DJD (degenerative joint disease)    History of DVT (deep vein thrombosis) 1989   Following childbirth   Hyperlipidemia    Hypertension    IBS (irritable bowel syndrome)    Rotator cuff syndrome of right shoulder 08/22/2011    Past Surgical History:  Procedure Laterality Date   ABDOMINAL HYSTERECTOMY     CESAREAN SECTION  1988   CHOLECYSTECTOMY     COLONOSCOPY  2005   Normal findings   COLONOSCOPY WITH PROPOFOL N/A 11/03/2020   Procedure: COLONOSCOPY WITH PROPOFOL;  Surgeon: Harvel Quale, MD;  [facility];  Service: Gastroenterology;  Laterality: N/A;  8:30   JOINT REPLACEMENT N/A    [employer] 02/28/2020   ORIF ANKLE FRACTURE Right 05/16/2019   Procedure: OPEN REDUCTION INTERNAL FIXATION (ORIF) ANKLE FRACTURE;  Surgeon: Shona Needles, MD;  Location: [facility];  Service: Orthopedics;  Laterality: Right;   ORIF FEMUR FRACTURE Right 05/16/2019   Procedure: OPEN REDUCTION INTERNAL FIXATION (ORIF) DISTAL FEMUR FRACTURE;  Surgeon: Shona Needles, MD;  Location: [facility];  Service: Orthopedics;  Laterality: Right;   SHOULDER SURGERY     Left shoulder manipulation with subcrominal decompression, capsulitis   TOTAL KNEE ARTHROPLASTY  2003   Left   TOTAL KNEE ARTHROPLASTY  2007   Right     Family History  Problem Relation Age of Onset   Heart disease Mother    Hypertension Mother    Cervical cancer Mother    Esophageal cancer Father    Hypertension Sister     Social History   Socioeconomic History   Marital status: Divorced    Spouse name: Not on file   Number of children: Not on file   Years of education: Not on file   Highest education level: Not on file  Occupational History   Occupation: disabled    Employer: UNEMPLOYED  Tobacco Use   Smoking status: Former    Types: Cigarettes    Quit date: 04/10/1988    Years since quitting: 32.9   Smokeless tobacco: Former   Tobacco comments:    Minimal prior use  Vaping Use   Vaping Use: Never used  Substance and Sexual Activity   Alcohol use: No   Drug use: No   Sexual activity: Not Currently    Birth control/protection: Surgical    Comment: hyst  Other Topics Concern   Not on file  Social History Narrative   Divorced   Resides with daughter, also has 2 sons   No regular exercise   Social Determinants of Health   Financial Resource Strain: Low Risk    Difficulty of Paying Living Expenses: Not hard at all  Food Insecurity: No Food Insecurity   Worried About Charity fundraiser in the Last Year: Never true   Ran Out of Food in the Last Year: Never true  Transportation Needs: No Transportation Needs   Lack of Transportation (Medical): No   Lack of Transportation (Non-Medical): No  Physical Activity: Inactive   Days of Exercise per Week: 0 days   Minutes of Exercise per Session: 0 min  Stress: No Stress Concern Present   Feeling of Stress : Not at all  Social Connections: Moderately Isolated   Frequency of Communication with Friends and Family: More than three times a week   Frequency of Social Gatherings with Friends and Family: More than three times a week   Attends Religious Services: 1 to 4 times per year   Active Member of [employer] or Organizations: No   Attends Archivist Meetings:  Never   Marital Status: Divorced  Human resources officer Violence: Not At Risk   Fear of Current or Ex-Partner: No   Emotionally Abused: No   Physically Abused: No   Sexually Abused: No    Outpatient Medications Prior to Visit  Medication Sig Dispense Refill   ALPRAZolam (XANAX) 0.5 MG tablet Take 1 tablet (0.5 mg total) by mouth 2 (two) times daily. 60 tablet 0   amLODipine (NORVASC) 5 MG tablet TAKE 1 TABLET BY MOUTH ONCE A DAY. 90 tablet 0   Blood Glucose Monitoring Suppl (ACCU-CHEK GUIDE) w/Device KIT USE TO TEST BLOOD GLUCOSE TWICE DAILY AS DIRECTED. 1 kit 0   Cholecalciferol (VITAMIN D-3) 125 MCG (5000 UT) TABS Take 5,000 Units by mouth in the morning.     colesevelam (WELCHOL) 625 MG tablet Take 1,875 mg by mouth 2 (two) times daily as needed (irritable bowel syndrome).     dicyclomine (BENTYL) 10 MG capsule Take 10 mg by mouth daily as needed (IBS).     docusate sodium (COLACE) 100 MG capsule Take 100 mg by mouth 2 (two) times daily as needed (constipation).     escitalopram (LEXAPRO) 20 MG tablet TAKE ONE TABLET BY MOUTH ONCE DAILY. 90 tablet 3   glipiZIDE (GLUCOTROL XL) 10 MG 24 hr tablet TAKE 1 TABLET BY MOUTH ONCE A DAY. 90 tablet 0   glucose blood (ACCU-CHEK GUIDE) test strip USE TO CHECK BLOOD GLUCOSE TWICE DAILY AS DIRECTED. 100 strip 5   HYDROcodone-acetaminophen (NORCO) 10-325 MG tablet TAKE 1 TABLET BY MOUTH FOUR TIMES DAILY AS NEEDED. 120 tablet 0   hydrOXYzine (ATARAX) 10 MG tablet TAKE (1) TABLET BY MOUTH (3) TIMES DAILY. 30 tablet 3   lisinopril (ZESTRIL) 10 MG tablet Take 1 tablet (10 mg total) by mouth daily. (Patient taking differently: Take 10 mg by mouth in the morning.) 90 tablet 3   metFORMIN (GLUCOPHAGE) 1000 MG tablet TAKE ONE TABLET BY MOUTH TWICE A DAY WITH A MEAL 180 tablet 1   Multiple Vitamin (MULTIVITAMIN WITH MINERALS) TABS tablet Take 1 tablet by mouth daily. One A Day for Women     Polyethyl Glycol-Propyl Glycol (LUBRICANT EYE DROPS) 0.4-0.3 % SOLN Place  1-2 drops into both eyes 3 (three) times daily as needed (dry/irritated/allergy eyes).     simvastatin (ZOCOR) 40 MG tablet TAKE 1 TABLET BY MOUTH ONCE DAILY. 90 tablet 3   TRULICITY 3 PY/0.9XI SOPN INJECT 3 MG INTO THE SKIN AS DIRECTED ONLY WEEKLY. 2 mL 3   clobetasol cream (TEMOVATE) 0.05 % Use bid x 2 weeks in affected areas then use 2-3 x a week (  Patient not taking: Reported on 02/25/2021) 45 g 3   No facility-administered medications prior to visit.    Allergies  Allergen Reactions   Peanut-Containing Drug Products Swelling    Review of Systems  Constitutional: Negative.   Respiratory: Negative.    Cardiovascular: Negative.   Psychiatric/Behavioral:  Negative for self-injury and suicidal ideas. The patient is nervous/anxious.       Objective:    Physical Exam Constitutional:      Appearance: Normal appearance.  Cardiovascular:     Rate and Rhythm: Normal rate and regular rhythm.     Pulses: Normal pulses.     Heart sounds: Normal heart sounds.  Pulmonary:     Effort: Pulmonary effort is normal.     Breath sounds: Normal breath sounds.  Neurological:     Mental Status: She is alert.  Psychiatric:     Comments: -Asking to increase her xanax; no anxious affect today, but states she has been getting nervious around 1400 -refused GDR on hydrocodone-APAP; states she would like to wait until after she meets with therapy    BP (!) 164/66    Pulse 96    Ht '5\' 9"'  (1.753 m)    Wt 198 lb 0.6 oz (89.8 kg)    SpO2 (!) 86%    BMI 29.25 kg/m  Wt Readings from Last 3 Encounters:  03/29/21 198 lb 0.6 oz (89.8 kg)  02/25/21 204 lb (92.5 kg)  01/28/21 201 lb 1.9 oz (91.2 kg)    Health Maintenance Due  Topic Date Due   OPHTHALMOLOGY EXAM  Never done   Zoster Vaccines- Shingrix (1 of 2) Never done   DEXA SCAN  Never done   COVID-19 Vaccine (3 - Pfizer risk series) 08/06/2019   INFLUENZA VACCINE  11/08/2020   Pneumonia Vaccine 16+ Years old (2 - PPSV23 if available, else PCV20)  03/09/2021    There are no preventive care reminders to display for this patient.   Lab Results  Component Value Date   TSH 0.391 (L) 09/10/2020   Lab Results  Component Value Date   WBC 6.9 12/17/2020   HGB 11.9 12/17/2020   HCT 36.6 12/17/2020   MCV 90 12/17/2020   PLT 353 12/17/2020   Lab Results  Component Value Date   NA 141 12/17/2020   K 4.2 12/17/2020   CO2 27 12/17/2020   GLUCOSE 201 (H) 12/17/2020   BUN 11 12/17/2020   CREATININE 0.55 (L) 12/17/2020   BILITOT 0.3 12/17/2020   ALKPHOS 116 12/17/2020   AST 20 12/17/2020   ALT 22 12/17/2020   PROT 6.8 12/17/2020   ALBUMIN 4.7 12/17/2020   CALCIUM 10.5 (H) 12/17/2020   ANIONGAP 10 05/15/2019   EGFR 99 12/17/2020   Lab Results  Component Value Date   CHOL 147 12/17/2020   Lab Results  Component Value Date   HDL 55 12/17/2020   Lab Results  Component Value Date   LDLCALC 75 12/17/2020   Lab Results  Component Value Date   TRIG 93 12/17/2020   No results found for: CHOLHDL Lab Results  Component Value Date   HGBA1C 7.6 (H) 12/17/2020       Assessment & Plan:   Problem List Items Addressed This Visit       Cardiovascular and Mediastinum   Essential hypertension, benign    BP Readings from Last 3 Encounters:  03/29/21 (!) 164/66  02/25/21 139/71  01/28/21 (!) 170/74  -Rx. Propranolol; attempting to hit HTN and anxiety  Relevant Medications   propranolol (INDERAL) 10 MG tablet   Other Relevant Orders   CBC with Differential/Platelet   Lipid Panel With LDL/HDL Ratio   CMP14+EGFR     Endocrine   Uncontrolled type 2 diabetes mellitus with hyperglycemia ([place])    -followed by Dr. Dorris Fetch        Other   Mixed hyperlipidemia    -checking lipid panel -goal LDL < 70      Relevant Medications   propranolol (INDERAL) 10 MG tablet   Other Relevant Orders   Lipid Panel With LDL/HDL Ratio   CMP14+EGFR   Anxiety state   Relevant Medications   propranolol (INDERAL) 10 MG tablet    Long-term use of high-risk medication - Primary    -has been taking xanax and hydrocodone-APAP -was taking the norco with Dr. Luan Pulling and has been taking it for years -UDS today      Relevant Orders   Drug Screen 12+Alcohol+CRT, Ur   Chronic back pain    -takes hydrocodone-APAP QID; we discussed gradual dose reduction, but she isn't interested today; states she may be after talking with therapy -UDS today      Relevant Orders   Drug Screen 12+Alcohol+CRT, Ur     Meds ordered this encounter  Medications   propranolol (INDERAL) 10 MG tablet    Sig: Take 1 tablet (10 mg total) by mouth 3 (three) times daily.    Dispense:  90 tablet    Refill:  [place], NP

## 2021-03-29 NOTE — Assessment & Plan Note (Signed)
-  takes hydrocodone-APAP QID; we discussed gradual dose reduction, but she isn't interested today; states she may be after talking with therapy -UDS today

## 2021-03-29 NOTE — Patient Instructions (Addendum)
Please have labs drawn today.  I will be moving to [place] at the end of the month and will no longer be your primary care provider.  [facility] and/or Dr. Allena Katz will review your medical records and determine if they will take you on as a patient. There is a chance that you will have to find a new primary care provider.

## 2021-03-29 NOTE — Assessment & Plan Note (Signed)
BP Readings from Last 3 Encounters:  03/29/21 (!) 164/66  02/25/21 139/71  01/28/21 (!) 170/74   -Rx. Propranolol; attempting to hit HTN and anxiety

## 2021-03-29 NOTE — Progress Notes (Signed)
-  Rx. Z-pack for sinus issues that have been ongoing

## 2021-03-30 LAB — CMP14+EGFR
ALT: 29 IU/L (ref 0–32)
AST: 27 IU/L (ref 0–40)
Albumin/Globulin Ratio: 2.2 (ref 1.2–2.2)
Albumin: 4.9 g/dL — ABNORMAL HIGH (ref 3.8–4.8)
Alkaline Phosphatase: 146 IU/L — ABNORMAL HIGH (ref 44–121)
BUN/Creatinine Ratio: 25 (ref 12–28)
BUN: 14 mg/dL (ref 8–27)
Bilirubin Total: 0.3 mg/dL (ref 0.0–1.2)
CO2: 27 mmol/L (ref 20–29)
Calcium: 10.1 mg/dL (ref 8.7–10.3)
Chloride: 97 mmol/L (ref 96–106)
Creatinine, Ser: 0.55 mg/dL — ABNORMAL LOW (ref 0.57–1.00)
Globulin, Total: 2.2 g/dL (ref 1.5–4.5)
Glucose: 184 mg/dL — ABNORMAL HIGH (ref 70–99)
Potassium: 5.1 mmol/L (ref 3.5–5.2)
Sodium: 135 mmol/L (ref 134–144)
Total Protein: 7.1 g/dL (ref 6.0–8.5)
eGFR: 99 mL/min/{1.73_m2} (ref >59)

## 2021-03-30 LAB — CBC WITH DIFFERENTIAL/PLATELET
Basophils Absolute: 0 10*3/uL (ref 0.0–0.2)
Basos: 1 %
EOS (ABSOLUTE): 0.1 10*3/uL (ref 0.0–0.4)
Eos: 1 %
Hematocrit: 35.5 % (ref 34.0–46.6)
Hemoglobin: 11.6 g/dL (ref 11.1–15.9)
Immature Grans (Abs): 0 10*3/uL (ref 0.0–0.1)
Immature Granulocytes: 0 %
Lymphocytes Absolute: 2.5 10*3/uL (ref 0.7–3.1)
Lymphs: 30 %
MCH: 29.2 pg (ref 26.6–33.0)
MCHC: 32.7 g/dL (ref 31.5–35.7)
MCV: 89 fL (ref 79–97)
Monocytes Absolute: 0.4 10*3/uL (ref 0.1–0.9)
Monocytes: 4 %
Neutrophils Absolute: 5.2 10*3/uL (ref 1.4–7.0)
Neutrophils: 64 %
Platelets: 381 10*3/uL (ref 150–450)
RBC: 3.97 x10E6/uL (ref 3.77–5.28)
RDW: 11 % — ABNORMAL LOW (ref 11.7–15.4)
WBC: 8.2 10*3/uL (ref 3.4–10.8)

## 2021-03-30 LAB — LIPID PANEL WITH LDL/HDL RATIO
Cholesterol, Total: 163 mg/dL (ref 100–199)
HDL: 69 mg/dL (ref >39)
LDL Chol Calc (NIH): 77 mg/dL (ref 0–99)
LDL/HDL Ratio: 1.1 ratio (ref 0.0–3.2)
Triglycerides: 93 mg/dL (ref 0–149)
VLDL Cholesterol Cal: 17 mg/dL (ref 5–40)

## 2021-03-30 NOTE — Progress Notes (Signed)
Alkaline phosphatase, s non-specific enzyme is slightly elevated. Please set her up for appt in a month with either Mitzi Davenport or Patel and recheck CMP.

## 2021-04-06 ENCOUNTER — Other Ambulatory Visit: Payer: Self-pay | Admitting: "Endocrinology

## 2021-04-07 ENCOUNTER — Ambulatory Visit: Payer: Medicare Other | Admitting: *Deleted

## 2021-04-07 DIAGNOSIS — F339 Major depressive disorder, recurrent, unspecified: Secondary | ICD-10-CM

## 2021-04-07 DIAGNOSIS — E1165 Type 2 diabetes mellitus with hyperglycemia: Secondary | ICD-10-CM

## 2021-04-07 DIAGNOSIS — F411 Generalized anxiety disorder: Secondary | ICD-10-CM

## 2021-04-07 DIAGNOSIS — I10 Essential (primary) hypertension: Secondary | ICD-10-CM

## 2021-04-08 NOTE — Patient Instructions (Signed)
Visit Information  Thank you for taking time to visit with me today. Please don't hesitate to contact me if I can be of assistance to you before our next scheduled telephone appointment.  Following are the goals we discussed today:  (- check out counseling - keep 90 percent of counseling appointments - expect call from [facility] to schedule counseling appointment   Our next appointment is by telephone on 05/05/21 at 10am  Please call the care guide team at [phone] if you need to cancel or reschedule your appointment.   If you are experiencing a Mental Health or Behavioral Health Crisis or need someone to talk to, please call the Suicide and Crisis Lifeline: 988 call the [place] National Suicide Prevention Lifeline: (902)628-4155 or TTY: 260-075-0865 TTY 3182561166) to talk to a trained counselor call 1-800-273-TALK (toll free, 24 hour hotline) go to [facility] [address], [place] [phone]) call the [facility] Line: [phone] call 911   The patient verbalized understanding of instructions, educational materials, and care plan provided today and declined offer to receive copy of patient instructions, educational materials, and care plan.   Reece Levy MSW, LCSW Licensed Occupational psychologist Primary Care [phone]
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
## 2021-04-13 LAB — DRUG SCREEN 12+ALCOHOL+CRT, UR
Amphetamines, Urine: NEGATIVE ng/mL
BENZODIAZ UR QL: POSITIVE ng/mL — AB
Barbiturate: NEGATIVE ng/mL
Cannabinoids: POSITIVE — AB
Cocaine (Metabolite): NEGATIVE ng/mL
Creatinine, Urine: 139.3 mg/dL (ref 20.0–300.0)
Ethanol, Urine: NEGATIVE %
Meperidine: NEGATIVE ng/mL
Methadone: NEGATIVE ng/mL
OPIATE SCREEN URINE: POSITIVE ng/mL — AB
Oxycodone/Oxymorphone, Urine: NEGATIVE ng/mL
Phencyclidine: NEGATIVE ng/mL
Propoxyphene: NEGATIVE ng/mL
Tramadol: NEGATIVE ng/mL

## 2021-04-16 ENCOUNTER — Other Ambulatory Visit: Payer: Self-pay | Admitting: Nurse Practitioner

## 2021-04-16 DIAGNOSIS — F411 Generalized anxiety disorder: Secondary | ICD-10-CM

## 2021-04-18 ENCOUNTER — Other Ambulatory Visit: Payer: Self-pay | Admitting: Internal Medicine

## 2021-04-18 ENCOUNTER — Other Ambulatory Visit: Payer: Self-pay

## 2021-04-18 ENCOUNTER — Telehealth: Payer: Self-pay | Admitting: Nurse Practitioner

## 2021-04-18 DIAGNOSIS — M545 Low back pain, unspecified: Secondary | ICD-10-CM

## 2021-04-18 MED ORDER — SIMVASTATIN 40 MG PO TABS: 40.0000 mg | ORAL_TABLET | Freq: Every day | ORAL | 3 refills | Status: AC

## 2021-04-18 NOTE — Telephone Encounter (Signed)
Pt called in for refills on   ALPRAZolam (XANAX) 0.5 MG tablet    HYDROcodone-acetaminophen (NORCO) 10-325 MG tablet   simvastatin (ZOCOR) 40 MG tablet

## 2021-04-25 ENCOUNTER — Other Ambulatory Visit: Payer: Self-pay

## 2021-04-25 ENCOUNTER — Encounter: Payer: Self-pay | Admitting: Nurse Practitioner

## 2021-04-25 ENCOUNTER — Ambulatory Visit (INDEPENDENT_AMBULATORY_CARE_PROVIDER_SITE_OTHER): Payer: Medicare Other | Admitting: Nurse Practitioner

## 2021-04-25 VITALS — BP 148/68 | HR 99 | Ht 69.0 in | Wt 200.0 lb

## 2021-04-25 DIAGNOSIS — F411 Generalized anxiety disorder: Secondary | ICD-10-CM

## 2021-04-25 DIAGNOSIS — Z23 Encounter for immunization: Secondary | ICD-10-CM | POA: Diagnosis not present

## 2021-04-25 DIAGNOSIS — R748 Abnormal levels of other serum enzymes: Secondary | ICD-10-CM | POA: Diagnosis not present

## 2021-04-25 DIAGNOSIS — I10 Essential (primary) hypertension: Secondary | ICD-10-CM | POA: Diagnosis not present

## 2021-04-25 NOTE — Assessment & Plan Note (Addendum)
EGFR GGT labs today.  Denies use of alcholol, abdominal pain.

## 2021-04-25 NOTE — Assessment & Plan Note (Addendum)
DASH diet and commitment to daily physical activity for a minimum of 30 minutes discussed and encouraged, as a part of hypertension management. The importance of attaining a healthy weight is also discussed.  BP/Weight 04/25/2021 03/29/2021 02/25/2021 01/28/2021 01/14/2021 01/04/2021 12/02/2020  Systolic BP 148 164 139 170 132 130 127  Diastolic BP 68 66 71 74 73 62 64  Wt. (Lbs) 200 198.04 204 201.12 204 204.2 198  BMI 29.53 29.25 30.13 29.7 30.13 30.16 29.24   Restart amlodipine 5mg  daily, propanolol 10mg  ,continue  lisinopril 10mg  daily. Recheck bp in 4 week s

## 2021-04-25 NOTE — Assessment & Plan Note (Addendum)
Propanolol 10mg  doily.  Patient takes Xanax 0.5 mg twice daily.  I discussed the need to come off of Xanax and start an SSRI with the patient. Patient agreed that we should discuss this during her next visit in 4 weeks., not willing ti start SSRI today.   GAD score 17. Denies SI, HI,

## 2021-04-25 NOTE — Progress Notes (Signed)
Melissa Tucker     MRN: [id number]      DOB: 07-11-1950   HPI Melissa Tucker is here for follow up for blood pressure .   She stopped taking amlodipine and propanolol because they made her nauseous, she had bronchitis around the time she developed nausea so she thought BP meds could be repressible for her nausea/  BP Willing to go back on propanolol 10mg  and amlodipine 5mg      ROS Denies recent fever or chills. Denies sinus pressure, nasal congestion, ear pain or sore throat. Denies chest congestion, productive cough or wheezing. Denies chest pains, palpitations and leg swelling Denies abdominal pain, nausea, vomiting,diarrhea or constipation.   Denies headaches, seizures, numbness, or tingling. Denies depression, anxiety or insomnia. Denies skin break down or rash.   PE  BP (!) 154/62 (Cuff Size: Normal)    Pulse 99    Ht 5\' 9"  (1.753 m)    Wt 200 lb (90.7 kg)    SpO2 92%    BMI 29.53 kg/m   Patient alert and oriented and in no cardiopulmonary distress.   Chest: Clear to auscultation bilaterally.  CVS: S1, S2 no murmurs, no S3.Regular rate.  ABD: Soft non tender.   Ext: No edema  Psych: Good eye contact, normal affect. Does not appear depressed, but she appears to be anxious GAD 7 score is 17. Denies SI , HI.  Assessment & Plan

## 2021-04-25 NOTE — Patient Instructions (Addendum)
Please start taking your amlodipine 5 mg daily propanolol 10 mg daily in addition to your lisinopril 10 mg daily for your blood pressure.  Please check your blood pressure at home 3 times weekly right-handed number and bring it with you to your next appointment.  It is important that you exercise regularly at least 30 minutes 5 times a week.  Think about what you will eat, plan ahead. Choose " clean, green, fresh or frozen" over canned, processed or packaged foods which are more sugary, salty and fatty. 70 to 75% of food eaten should be vegetables and fruit. Three meals at set times with snacks allowed between meals, but they must be fruit or vegetables. Aim to eat over a 12 hour period , example 7 am to 7 pm, and STOP after  your last meal of the day. Drink water,generally about 64 ounces per day, no other drink is as healthy. Fruit juice is best enjoyed in a healthy way, by EATING the fruit.  Thanks for choosing [facility], we consider it a privelige to serve you.

## 2021-04-26 ENCOUNTER — Other Ambulatory Visit: Payer: Self-pay | Admitting: Nurse Practitioner

## 2021-04-26 ENCOUNTER — Telehealth: Payer: Self-pay

## 2021-04-26 DIAGNOSIS — R748 Abnormal levels of other serum enzymes: Secondary | ICD-10-CM

## 2021-04-26 LAB — CMP14+EGFR
ALT: 20 IU/L (ref 0–32)
AST: 14 IU/L (ref 0–40)
Albumin/Globulin Ratio: 2.4 — ABNORMAL HIGH (ref 1.2–2.2)
Albumin: 5 g/dL — ABNORMAL HIGH (ref 3.8–4.8)
Alkaline Phosphatase: 137 IU/L — ABNORMAL HIGH (ref 44–121)
BUN/Creatinine Ratio: 36 — ABNORMAL HIGH (ref 12–28)
BUN: 19 mg/dL (ref 8–27)
Bilirubin Total: 0.2 mg/dL (ref 0.0–1.2)
CO2: 24 mmol/L (ref 20–29)
Calcium: 10.2 mg/dL (ref 8.7–10.3)
Chloride: 98 mmol/L (ref 96–106)
Creatinine, Ser: 0.53 mg/dL — ABNORMAL LOW (ref 0.57–1.00)
Globulin, Total: 2.1 g/dL (ref 1.5–4.5)
Glucose: 245 mg/dL — ABNORMAL HIGH (ref 70–99)
Potassium: 4.6 mmol/L (ref 3.5–5.2)
Sodium: 140 mmol/L (ref 134–144)
Total Protein: 7.1 g/dL (ref 6.0–8.5)
eGFR: 100 mL/min/{1.73_m2} (ref >59)

## 2021-04-26 LAB — GAMMA GT: GGT: 31 IU/L (ref 0–60)

## 2021-04-26 MED ORDER — FLUCONAZOLE 150 MG PO TABS: ORAL_TABLET | ORAL | 1 refills | Status: DC

## 2021-04-26 NOTE — Telephone Encounter (Signed)
Pt called stating that the medicine you gave her gave her a yeast infection wanted to know if you can call her something in. Pt states that she burns urinate.  Pt [place] [facility]

## 2021-04-26 NOTE — Telephone Encounter (Signed)
She had gotten antibiotic from PCP and has yeast, will rx diflucan

## 2021-04-26 NOTE — Addendum Note (Signed)
Addended by: Cyril Mourning A on: 04/26/2021 09:39 AM   Modules accepted: Orders

## 2021-04-27 ENCOUNTER — Encounter (INDEPENDENT_AMBULATORY_CARE_PROVIDER_SITE_OTHER): Payer: Self-pay | Admitting: *Deleted

## 2021-05-04 ENCOUNTER — Ambulatory Visit: Payer: Medicare Other | Admitting: Internal Medicine

## 2021-05-05 ENCOUNTER — Telehealth: Payer: Medicare Other

## 2021-05-09 DIAGNOSIS — I10 Essential (primary) hypertension: Secondary | ICD-10-CM | POA: Diagnosis not present

## 2021-05-09 DIAGNOSIS — G4700 Insomnia, unspecified: Secondary | ICD-10-CM | POA: Diagnosis not present

## 2021-05-09 DIAGNOSIS — Z0189 Encounter for other specified special examinations: Secondary | ICD-10-CM | POA: Diagnosis not present

## 2021-05-09 DIAGNOSIS — E119 Type 2 diabetes mellitus without complications: Secondary | ICD-10-CM | POA: Diagnosis not present

## 2021-05-09 DIAGNOSIS — R112 Nausea with vomiting, unspecified: Secondary | ICD-10-CM | POA: Diagnosis not present

## 2021-05-09 DIAGNOSIS — E785 Hyperlipidemia, unspecified: Secondary | ICD-10-CM | POA: Diagnosis not present

## 2021-05-09 DIAGNOSIS — G894 Chronic pain syndrome: Secondary | ICD-10-CM | POA: Diagnosis not present

## 2021-05-10 ENCOUNTER — Other Ambulatory Visit: Payer: Self-pay | Admitting: "Endocrinology

## 2021-05-10 ENCOUNTER — Ambulatory Visit: Payer: Medicare Other | Admitting: *Deleted

## 2021-05-10 NOTE — Patient Instructions (Signed)
Visit Information  Thank you for taking time to visit with me today. Please don't hesitate to contact me if I can be of assistance to you before our next scheduled telephone appointment.  Following are the goals we discussed today:  Take medications as prescribed   Attend all scheduled provider appointments Attend church or other social activities Perform all self care activities independently  Call provider office for new concerns or questions  check blood sugar at prescribed times: once daily enter blood sugar readings and medication or insulin into daily log take the blood sugar log to all doctor visits take the blood sugar meter to all doctor visits limit fast food meals to no more than 1 per week manage portion size wash and dry feet carefully every day check blood pressure 3 times per week choose a place to take my blood pressure (home, clinic or office, retail store) write blood pressure results in a log or diary take blood pressure log to all doctor appointments eat more whole grains, fruits and vegetables, lean meats and healthy fats Case closed due to new primary care provider does not participate in embedded chronic care management services  Our next appointment is - no further follow up , case closed.  Patient has new primary care provider that does not participate in CCM services.  Please call the care guide team at [phone] if you need to cancel or reschedule your appointment.   If you are experiencing a Mental Health or Behavioral Health Crisis or need someone to talk to, please call the Suicide and Crisis Lifeline: 988 call the [place] National Suicide Prevention Lifeline: (712) 401-1831 or TTY: (857)664-8248 TTY (629)883-9752) to talk to a trained counselor call 1-800-273-TALK (toll free, 24 hour hotline) go to [facility] [address] [place] [phone]) call the [facility] Crisis Line: [phone] call 911    Patient verbalizes understanding of instructions and care plan provided today and agrees to view in MyChart. Active MyChart status confirmed with patient.    No further follow up required: case closed  Irving Shows [facility], BSN RN Case Manager [facility] Primary Care [phone]

## 2021-05-10 NOTE — Chronic Care Management (AMB) (Signed)
° °  05/10/2021  Melissa Tucker Aug 31, 1950 [id number]   Telephone call with patient for assessment, patient reports she now has new primary care provider Melissa Tucker. [facility] in [place] and saw for visit on 05/09/21.  RN care manager completed care plan, closed case.  Sent in basket to [employer] reporting the above information.  Irving Shows [facility], BSN RN Case Manager [facility] [phone]

## 2021-05-12 ENCOUNTER — Other Ambulatory Visit: Payer: Self-pay | Admitting: "Endocrinology

## 2021-05-13 ENCOUNTER — Telehealth: Payer: Medicare Other

## 2021-05-16 ENCOUNTER — Other Ambulatory Visit (HOSPITAL_COMMUNITY): Payer: Self-pay | Admitting: Internal Medicine

## 2021-05-16 ENCOUNTER — Other Ambulatory Visit: Payer: Self-pay | Admitting: Internal Medicine

## 2021-05-16 DIAGNOSIS — M545 Low back pain, unspecified: Secondary | ICD-10-CM

## 2021-05-16 DIAGNOSIS — Z1231 Encounter for screening mammogram for malignant neoplasm of breast: Secondary | ICD-10-CM

## 2021-05-16 NOTE — Telephone Encounter (Signed)
Spoke with Melissa Tucker she states that she is at Dr Juel Burrow office and the pharmacy was suppose to send request to him and not [place]. Advised to reach out to pharmacy

## 2021-05-23 ENCOUNTER — Ambulatory Visit (HOSPITAL_COMMUNITY): Payer: Medicare Other

## 2021-05-23 ENCOUNTER — Ambulatory Visit: Payer: Medicare Other | Admitting: Nurse Practitioner

## 2021-05-30 DIAGNOSIS — I10 Essential (primary) hypertension: Secondary | ICD-10-CM | POA: Diagnosis not present

## 2021-05-30 DIAGNOSIS — E119 Type 2 diabetes mellitus without complications: Secondary | ICD-10-CM | POA: Diagnosis not present

## 2021-06-01 ENCOUNTER — Telehealth: Payer: Self-pay

## 2021-06-01 NOTE — Telephone Encounter (Signed)
Spoke with patient advised it was because of elevated liver enzymes on blood work patient declined visits with them and is now seeing Dr Nevada Crane

## 2021-06-01 NOTE — Telephone Encounter (Signed)
Patient called a asking why did provider refer her to for her to Dr Karilyn Cota. Patient is not aware of this, please contact patient back at [phone]

## 2021-06-06 ENCOUNTER — Ambulatory Visit (INDEPENDENT_AMBULATORY_CARE_PROVIDER_SITE_OTHER): Payer: Medicare Other | Admitting: Gastroenterology

## 2021-06-06 DIAGNOSIS — R112 Nausea with vomiting, unspecified: Secondary | ICD-10-CM | POA: Diagnosis not present

## 2021-06-06 DIAGNOSIS — E1165 Type 2 diabetes mellitus with hyperglycemia: Secondary | ICD-10-CM | POA: Diagnosis not present

## 2021-06-06 DIAGNOSIS — R809 Proteinuria, unspecified: Secondary | ICD-10-CM | POA: Diagnosis not present

## 2021-06-06 DIAGNOSIS — I10 Essential (primary) hypertension: Secondary | ICD-10-CM | POA: Diagnosis not present

## 2021-06-06 DIAGNOSIS — G894 Chronic pain syndrome: Secondary | ICD-10-CM | POA: Diagnosis not present

## 2021-06-06 DIAGNOSIS — E785 Hyperlipidemia, unspecified: Secondary | ICD-10-CM | POA: Diagnosis not present

## 2021-06-06 DIAGNOSIS — G4700 Insomnia, unspecified: Secondary | ICD-10-CM | POA: Diagnosis not present

## 2021-06-07 ENCOUNTER — Other Ambulatory Visit: Payer: Self-pay | Admitting: Adult Health

## 2021-06-13 ENCOUNTER — Other Ambulatory Visit (HOSPITAL_COMMUNITY): Payer: Medicare Other

## 2021-06-13 ENCOUNTER — Ambulatory Visit (HOSPITAL_COMMUNITY): Payer: Medicare Other

## 2021-06-22 ENCOUNTER — Other Ambulatory Visit (HOSPITAL_COMMUNITY): Payer: Medicare Other

## 2021-06-22 ENCOUNTER — Ambulatory Visit (HOSPITAL_COMMUNITY): Payer: Medicare Other

## 2021-06-23 ENCOUNTER — Ambulatory Visit (HOSPITAL_COMMUNITY): Payer: Self-pay | Admitting: Psychiatry

## 2021-06-23 ENCOUNTER — Ambulatory Visit (HOSPITAL_COMMUNITY): Payer: Medicare Other

## 2021-06-23 ENCOUNTER — Other Ambulatory Visit: Payer: Self-pay

## 2021-06-29 ENCOUNTER — Other Ambulatory Visit (HOSPITAL_COMMUNITY): Payer: Medicare Other

## 2021-06-29 ENCOUNTER — Ambulatory Visit (HOSPITAL_COMMUNITY): Payer: Medicare Other

## 2021-06-29 ENCOUNTER — Ambulatory Visit: Payer: Medicare Other | Admitting: Internal Medicine

## 2021-07-05 ENCOUNTER — Other Ambulatory Visit: Payer: Self-pay | Admitting: "Endocrinology

## 2021-07-06 ENCOUNTER — Encounter: Payer: Self-pay | Admitting: "Endocrinology

## 2021-07-06 ENCOUNTER — Ambulatory Visit (HOSPITAL_COMMUNITY)
Admission: RE | Admit: 2021-07-06 | Discharge: 2021-07-06 | Disposition: A | Discharge status: Home / Self Care | Payer: Medicare Other | Source: Ambulatory Visit | Attending: Internal Medicine | Admitting: Internal Medicine

## 2021-07-06 ENCOUNTER — Ambulatory Visit (INDEPENDENT_AMBULATORY_CARE_PROVIDER_SITE_OTHER): Payer: Medicare Other | Admitting: "Endocrinology

## 2021-07-06 VITALS — BP 132/70 | HR 104 | Ht 69.0 in | Wt 197.6 lb

## 2021-07-06 DIAGNOSIS — Z1231 Encounter for screening mammogram for malignant neoplasm of breast: Secondary | ICD-10-CM | POA: Insufficient documentation

## 2021-07-06 DIAGNOSIS — E782 Mixed hyperlipidemia: Secondary | ICD-10-CM

## 2021-07-06 DIAGNOSIS — I10 Essential (primary) hypertension: Secondary | ICD-10-CM

## 2021-07-06 DIAGNOSIS — E1165 Type 2 diabetes mellitus with hyperglycemia: Secondary | ICD-10-CM

## 2021-07-06 LAB — POCT GLYCOSYLATED HEMOGLOBIN (HGB A1C): HbA1c, POC (controlled diabetic range): 8.1 % — AB (ref 0.0–7.0)

## 2021-07-06 MED ORDER — TRULICITY 4.5 MG/0.5ML ~~LOC~~ SOAJ: 4.5000 mg | SUBCUTANEOUS | 2 refills | Status: DC

## 2021-07-06 NOTE — Patient Instructions (Signed)

## 2021-07-06 NOTE — Progress Notes (Signed)
? ?   ?     07/06/2021, 3:31 PM ? ? ?     ?Endocrinology follow-up note ? ? ?Subjective:  ? ? Patient ID: Melissa Tucker, female    DOB: 05/21/1950.  ?Melissa Tucker is seen in follow-up for management of currently uncontrolled symptomatic diabetes, hyperlipidemia, hypertension requested by  Celene Squibb, MD. ? ? ?Past Medical History:  ?Diagnosis Date  ? Anxiety and depression   ? Chest tightness   ? DEEP VENOUS THROMBOPHLEBITIS 10/05/2009  ? Qualifier: History of  By: Lattie Haw, MD, Claud Kelp   ? Diabetes mellitus 1994  ? No insulin  ? DJD (degenerative joint disease)   ? History of DVT (deep vein thrombosis) 1989  ? Following childbirth  ? Hyperlipidemia   ? Hypertension   ? IBS (irritable bowel syndrome)   ? Rotator cuff syndrome of right shoulder 08/22/2011  ? ?Past Surgical History:  ?Procedure Laterality Date  ? ABDOMINAL HYSTERECTOMY    ? [place]  ? CHOLECYSTECTOMY    ? COLONOSCOPY  2005  ? Normal findings  ? COLONOSCOPY WITH PROPOFOL N/A 11/03/2020  ? Procedure: COLONOSCOPY WITH PROPOFOL;  Surgeon: Harvel Quale, MD;  Location: [facility];  Service: Gastroenterology;  Laterality: N/A;  8:30  ? JOINT REPLACEMENT N/A   ? [employer] 02/28/2020  ? ORIF ANKLE FRACTURE Right 05/16/2019  ? Procedure: OPEN REDUCTION INTERNAL FIXATION (ORIF) ANKLE FRACTURE;  Surgeon: Shona Needles, MD;  Location: [facility];  Service: Orthopedics;  Laterality: Right;  ? ORIF FEMUR FRACTURE Right 05/16/2019  ? Procedure: OPEN REDUCTION INTERNAL FIXATION (ORIF) DISTAL FEMUR FRACTURE;  Surgeon: Shona Needles, MD;  Location: [facility];  Service: Orthopedics;  Laterality: Right;  ? SHOULDER SURGERY    ? Left shoulder manipulation with subcrominal decompression, capsulitis  ? TOTAL KNEE ARTHROPLASTY  2003  ? Left  ? TOTAL KNEE ARTHROPLASTY  2007  ? Right  ? ?Social History  ? ?Socioeconomic History  ? Marital status: Divorced  ?  Spouse name: Not on file  ? Number of children: Not on file  ? Years of  education: Not on file  ? Highest education level: Not on file  ?Occupational History  ? Occupation: disabled  ?  Employer: UNEMPLOYED  ?Tobacco Use  ? Smoking status: Former  ?  Types: Cigarettes  ?  Quit date: 04/10/1988  ?  Years since quitting: 33.2  ? Smokeless tobacco: Former  ? Tobacco comments:  ?  Minimal prior use  ?Vaping Use  ? Vaping Use: Never used  ?Substance and Sexual Activity  ? Alcohol use: No  ? Drug use: No  ? Sexual activity: Not Currently  ?  Birth control/protection: Surgical  ?  Comment: hyst  ?Other Topics Concern  ? Not on file  ?Social History Narrative  ? Divorced  ? Resides with daughter, also has 2 sons  ? No regular exercise  ? ?Social Determinants of Health  ? ?Financial Resource Strain: Low Risk   ? Difficulty of Paying Living Expenses: Not hard at all  ?Food Insecurity: No Food Insecurity  ? Worried About Charity fundraiser in the Last Year: Never true  ? Ran Out of Food in the Last Year: Never true  ?Transportation Needs: No Transportation Needs  ? Lack of Transportation (Medical): No  ? Lack of Transportation (Non-Medical): No  ?Physical Activity: Inactive  ? Days of Exercise per Week: 0 days  ? Minutes of Exercise per Session:  0 min  ?Stress: No Stress Concern Present  ? Feeling of Stress : Not at all  ?Social Connections: Moderately Isolated  ? Frequency of Communication with Friends and Family: More than three times a week  ? Frequency of Social Gatherings with Friends and Family: More than three times a week  ? Attends Religious Services: 1 to 4 times per year  ? Active Member of Clubs or Organizations: No  ? Attends Archivist Meetings: Never  ? Marital Status: Divorced  ? ?Outpatient Encounter Medications as of 07/06/2021  ?Medication Sig  ? Dulaglutide (TRULICITY) 4.5 EL/3.8BO SOPN Inject 4.5 mg as directed once a week.  ? ALPRAZolam (XANAX) 0.5 MG tablet TAKE 1 TABLET BY MOUTH TWICE DAILY AS NEEDED FOR ANXIETY  ? amLODipine (NORVASC) 5 MG tablet TAKE 1 TABLET BY  MOUTH ONCE A DAY. (Patient not taking: Reported on 04/25/2021)  ? Blood Glucose Monitoring Suppl (ACCU-CHEK GUIDE) w/Device KIT USE TO TEST BLOOD GLUCOSE TWICE DAILY AS DIRECTED.  ? Cholecalciferol (VITAMIN D-3) 125 MCG (5000 UT) TABS Take 5,000 Units by mouth in the morning.  ? clobetasol cream (TEMOVATE) 0.05 % Use bid x 2 weeks in affected areas then use 2-3 x a week  ? colesevelam (WELCHOL) 625 MG tablet Take 1,875 mg by mouth 2 (two) times daily as needed (irritable bowel syndrome).  ? dicyclomine (BENTYL) 10 MG capsule Take 10 mg by mouth daily as needed (IBS).  ? escitalopram (LEXAPRO) 20 MG tablet TAKE ONE TABLET BY MOUTH ONCE DAILY.  ? fluconazole (DIFLUCAN) 150 MG tablet TAKE 1 TABLET BY MOUTH NOW, THEN REPEAT DOSE IN THREE DAYS IF NEEDED. Do not take statin drug when taking diflucan  ? glipiZIDE (GLUCOTROL XL) 10 MG 24 hr tablet TAKE 1 TABLET BY MOUTH ONCE A DAY.  ? glucose blood (ACCU-CHEK GUIDE) test strip USE TO CHECK BLOOD GLUCOSE TWICE DAILY AS DIRECTED.  ? HYDROcodone-acetaminophen (NORCO) 10-325 MG tablet TAKE 1 TABLET BY MOUTH FOUR TIMES DAILY AS NEEDED.  ? hydrOXYzine (ATARAX) 10 MG tablet TAKE (1) TABLET BY MOUTH (3) TIMES DAILY.  ? lisinopril (ZESTRIL) 10 MG tablet Take 1 tablet (10 mg total) by mouth daily. (Patient taking differently: Take 20 mg by mouth in the morning.)  ? metFORMIN (GLUCOPHAGE) 1000 MG tablet TAKE ONE TABLET BY MOUTH TWICE A DAY WITH A MEAL  ? Multiple Vitamin (MULTIVITAMIN WITH MINERALS) TABS tablet Take 1 tablet by mouth daily. One A Day for Women  ? Polyethyl Glycol-Propyl Glycol (LUBRICANT EYE DROPS) 0.4-0.3 % SOLN Place 1-2 drops into both eyes 3 (three) times daily as needed (dry/irritated/allergy eyes).  ? propranolol (INDERAL) 10 MG tablet Take 1 tablet (10 mg total) by mouth 3 (three) times daily. (Patient not taking: Reported on 04/25/2021)  ? simvastatin (ZOCOR) 40 MG tablet Take 1 tablet (40 mg total) by mouth daily.  ? [DISCONTINUED] TRULICITY 3 FB/5.1WC SOPN  INJECT 3 MG INTO THE SKIN AS DIRECTED ONLY WEEKLY.  ? ?No facility-administered encounter medications on file as of 07/06/2021.  ? ? ?ALLERGIES: ?Allergies  ?Allergen Reactions  ? Peanut-Containing Drug Products Swelling  ? ? ?VACCINATION STATUS: ?Immunization History  ?Administered Date(s) Administered  ? Fluad Quad(high Dose 65+) 03/09/2020, 04/25/2021  ? PFIZER(Purple Top)SARS-COV-2 Vaccination 06/18/2019, 07/09/2019  ? PPD Test 12/17/2020  ? Pneumococcal Conjugate-13 03/09/2020  ? ? ?Diabetes ?She presents for her follow-up diabetic visit. She has type 2 diabetes mellitus. Onset time: She was diagnosed at approximate age of 4 years. Her disease course has been  worsening. There are no hypoglycemic associated symptoms. Pertinent negatives for hypoglycemia include no confusion, headaches, pallor or seizures. Pertinent negatives for diabetes include no blurred vision, no chest pain, no fatigue, no polydipsia, no polyphagia and no polyuria. There are no hypoglycemic complications. Symptoms are worsening. Diabetic complications include retinopathy. Risk factors for coronary artery disease include diabetes mellitus, dyslipidemia, family history, hypertension, obesity, sedentary lifestyle, post-menopausal and tobacco exposure. Current diabetic treatments: She is currently on Invokana 300 mg p.o. daily metformin/glipizide,_0 She is compliant with treatment some of the time. Her weight is decreasing steadily (She lost approximately 10 pounds since last visit.). She is following a generally unhealthy diet. When asked about meal planning, she reported none. She has not had a previous visit with a dietitian. She never participates in exercise. Her home blood glucose trend is decreasing steadily. Her breakfast blood glucose range is generally 180-200 mg/dl. Her dinner blood glucose range is generally 180-200 mg/dl. Her bedtime blood glucose range is generally 180-200 mg/dl. Her overall blood glucose range is 180-200 mg/dl.  (She presents with worsening glycemic profile averaging between 199-230 for the last 30 days.  Her point-of-care A1c is 8.1%, worsening from 7.6% during her last visit.   ?She did not document any hypoglycemia.

## 2021-08-04 ENCOUNTER — Other Ambulatory Visit: Payer: Self-pay | Admitting: "Endocrinology

## 2021-08-08 ENCOUNTER — Other Ambulatory Visit: Payer: Self-pay | Admitting: "Endocrinology

## 2021-08-29 ENCOUNTER — Other Ambulatory Visit: Payer: Self-pay | Admitting: Adult Health

## 2021-09-26 ENCOUNTER — Other Ambulatory Visit: Payer: Self-pay | Admitting: "Endocrinology

## 2021-09-26 ENCOUNTER — Other Ambulatory Visit: Payer: Self-pay | Admitting: Adult Health

## 2021-10-06 ENCOUNTER — Ambulatory Visit: Payer: Medicare Other | Admitting: "Endocrinology

## 2021-10-18 DIAGNOSIS — R7301 Impaired fasting glucose: Secondary | ICD-10-CM | POA: Diagnosis not present

## 2021-10-18 DIAGNOSIS — I10 Essential (primary) hypertension: Secondary | ICD-10-CM | POA: Diagnosis not present

## 2021-10-18 LAB — BASIC METABOLIC PANEL
BUN: 12 (ref 4–21)
CO2: 23 — AB (ref 13–22)
Chloride: 100 (ref 99–108)
Creatinine: 0.6 (ref 0.5–1.1)
Glucose: 186
Potassium: 4.7 mEq/L (ref 3.5–5.1)
Sodium: 139 (ref 137–147)

## 2021-10-18 LAB — HEPATIC FUNCTION PANEL
ALT: 21 U/L (ref 7–35)
AST: 15 (ref 13–35)
Alkaline Phosphatase: 109 (ref 25–125)
Bilirubin, Total: 0.3

## 2021-10-18 LAB — COMPREHENSIVE METABOLIC PANEL
Albumin: 4.5 (ref 3.5–5.0)
Calcium: 10.3 (ref 8.7–10.7)
Globulin: 2

## 2021-10-18 LAB — HEMOGLOBIN A1C: Hemoglobin A1C: 6.8

## 2021-10-18 LAB — LIPID PANEL
Cholesterol: 143 (ref 0–200)
HDL: 68 (ref 35–70)
LDL Cholesterol: 59
Triglycerides: 81 (ref 40–160)

## 2021-10-19 ENCOUNTER — Other Ambulatory Visit: Payer: Self-pay | Admitting: Adult Health

## 2021-10-21 ENCOUNTER — Other Ambulatory Visit: Payer: Self-pay | Admitting: "Endocrinology

## 2021-10-28 ENCOUNTER — Other Ambulatory Visit: Payer: Self-pay | Admitting: Adult Health

## 2021-11-01 ENCOUNTER — Ambulatory Visit: Payer: Medicare Other | Admitting: "Endocrinology

## 2021-11-07 ENCOUNTER — Other Ambulatory Visit: Payer: Self-pay | Admitting: "Endocrinology

## 2021-11-08 ENCOUNTER — Telehealth: Payer: Self-pay | Admitting: "Endocrinology

## 2021-11-08 DIAGNOSIS — E1165 Type 2 diabetes mellitus with hyperglycemia: Secondary | ICD-10-CM

## 2021-11-08 MED ORDER — GLIPIZIDE ER 10 MG PO TB24: 10.0000 mg | ORAL_TABLET | Freq: Every day | ORAL | 0 refills | Status: DC

## 2021-11-08 NOTE — Telephone Encounter (Signed)
Rx sent 

## 2021-11-08 NOTE — Telephone Encounter (Signed)
Pt called and said she missed her appt last week because her son passed way. She said she is sorry and she has r/sed for next week. Can you send in her Glipizide, please

## 2021-11-10 ENCOUNTER — Other Ambulatory Visit: Payer: Self-pay | Admitting: "Endocrinology

## 2021-11-10 ENCOUNTER — Other Ambulatory Visit: Payer: Self-pay | Admitting: Adult Health

## 2021-11-14 ENCOUNTER — Ambulatory Visit (INDEPENDENT_AMBULATORY_CARE_PROVIDER_SITE_OTHER): Payer: Medicare Other | Admitting: "Endocrinology

## 2021-11-14 ENCOUNTER — Encounter: Payer: Self-pay | Admitting: "Endocrinology

## 2021-11-14 VITALS — BP 136/74 | HR 72 | Ht 69.0 in | Wt 183.6 lb

## 2021-11-14 DIAGNOSIS — I10 Essential (primary) hypertension: Secondary | ICD-10-CM | POA: Diagnosis not present

## 2021-11-14 DIAGNOSIS — E782 Mixed hyperlipidemia: Secondary | ICD-10-CM

## 2021-11-14 DIAGNOSIS — E1165 Type 2 diabetes mellitus with hyperglycemia: Secondary | ICD-10-CM | POA: Diagnosis not present

## 2021-11-14 MED ORDER — TRULICITY 3 MG/0.5ML ~~LOC~~ SOAJ: 3.0000 mg | SUBCUTANEOUS | 3 refills | Status: DC

## 2021-11-14 NOTE — Progress Notes (Signed)
11/14/2021, 4:29 PM        Endocrinology follow-up note   Subjective:    Patient ID: Melissa Tucker, female    DOB: Nov 03, 1950.  Melissa Tucker is seen in follow-up for management of currently uncontrolled symptomatic diabetes, hyperlipidemia, hypertension requested by  Celene Squibb, MD.   Past Medical History:  Diagnosis Date   Anxiety and depression    Chest tightness    DEEP VENOUS THROMBOPHLEBITIS 10/05/2009   Qualifier: History of  By: Lattie Haw, MD, Claud Kelp    Diabetes mellitus 1994   No insulin   DJD (degenerative joint disease)    History of DVT (deep vein thrombosis) 1989   Following childbirth   Hyperlipidemia    Hypertension    IBS (irritable bowel syndrome)    Rotator cuff syndrome of right shoulder 08/22/2011   Past Surgical History:  Procedure Laterality Date   [place]     COLONOSCOPY  2005   Normal findings   COLONOSCOPY WITH PROPOFOL N/A 11/03/2020   Procedure: COLONOSCOPY WITH PROPOFOL;  Surgeon: Harvel Quale, MD;  [facility];  Service: Gastroenterology;  Laterality: N/A;  8:30   JOINT REPLACEMENT N/A    Phreesia 02/28/2020   ORIF ANKLE FRACTURE Right 05/16/2019   Procedure: OPEN REDUCTION INTERNAL FIXATION (ORIF) ANKLE FRACTURE;  Surgeon: Shona Needles, MD;  Location: [facility];  Service: Orthopedics;  Laterality: Right;   ORIF FEMUR FRACTURE Right 05/16/2019   Procedure: OPEN REDUCTION INTERNAL FIXATION (ORIF) DISTAL FEMUR FRACTURE;  Surgeon: Shona Needles, MD;  Location: [facility];  Service: Orthopedics;  Laterality: Right;   SHOULDER SURGERY     Left shoulder manipulation with subcrominal decompression, capsulitis   TOTAL KNEE ARTHROPLASTY  2003   Left   TOTAL KNEE ARTHROPLASTY  2007   Right   Social History   Socioeconomic History   Marital status: Divorced    Spouse name: Not on file   Number of children: Not on file   Years of  education: Not on file   Highest education level: Not on file  Occupational History   Occupation: disabled    Employer: UNEMPLOYED  Tobacco Use   Smoking status: Former    Types: Cigarettes    Quit date: 04/10/1988    Years since quitting: 33.6   Smokeless tobacco: Former   Tobacco comments:    Minimal prior use  Vaping Use   Vaping Use: Never used  Substance and Sexual Activity   Alcohol use: No   Drug use: No   Sexual activity: Not Currently    Birth control/protection: Surgical    Comment: hyst  Other Topics Concern   Not on file  Social History Narrative   Divorced   Resides with daughter, also has 2 sons   No regular exercise   Social Determinants of Health   Financial Resource Strain: Low Risk  (10/04/2020)   Overall Financial Resource Strain (CARDIA)    Difficulty of Paying Living Expenses: Not hard at all  Food Insecurity: No Food Insecurity (03/15/2021)   Hunger Vital Sign    Worried About Running Out of Food in the Last Year: Never true    [place] in the Last Year: Never true  Transportation Needs: No Transportation Needs (03/15/2021)   PRAPARE - Hydrologist (Medical): No  Lack of Transportation (Non-Medical): No  Physical Activity: Inactive (10/04/2020)   Exercise Vital Sign    Days of Exercise per Week: 0 days    Minutes of Exercise per Session: 0 min  Stress: No Stress Concern Present (10/04/2020)   [place]    Feeling of Stress : Not at all  Social Connections: Moderately Isolated (10/04/2020)   Social Connection and Isolation Panel [NHANES]    Frequency of Communication with Friends and Family: More than three times a week    Frequency of Social Gatherings with Friends and Family: More than three times a week    Attends Religious Services: 1 to 4 times per year    Active Member of [employer] or Organizations: No    Attends Archivist Meetings:  Never    Marital Status: Divorced   Outpatient Encounter Medications as of 11/14/2021  Medication Sig   amLODipine (NORVASC) 5 MG tablet TAKE 1 TABLET BY MOUTH ONCE A DAY. (Patient taking differently: Take 2 tablets by mouth daily.)   Dulaglutide (TRULICITY) 3 PJ/0.3PR SOPN Inject 3 mg as directed once a week.   ALPRAZolam (XANAX) 0.5 MG tablet TAKE 1 TABLET BY MOUTH TWICE DAILY AS NEEDED FOR ANXIETY   Blood Glucose Monitoring Suppl (ACCU-CHEK GUIDE) w/Device KIT USE TO TEST BLOOD GLUCOSE TWICE DAILY AS DIRECTED.   Cholecalciferol (VITAMIN D-3) 125 MCG (5000 UT) TABS Take 5,000 Units by mouth in the morning.   clobetasol cream (TEMOVATE) 0.05 % Use bid x 2 weeks in affected areas then use 2-3 x a week   colesevelam (WELCHOL) 625 MG tablet Take 1,875 mg by mouth 2 (two) times daily as needed (irritable bowel syndrome).   dicyclomine (BENTYL) 10 MG capsule Take 10 mg by mouth daily as needed (IBS).   escitalopram (LEXAPRO) 20 MG tablet TAKE ONE TABLET BY MOUTH ONCE DAILY.   fluconazole (DIFLUCAN) 150 MG tablet TAKE 1 TABLET BY MOUTH NOW, THEN REPEAT DOSE IN THREE DAYS IF NEEDED. HOLD STATIN.   glipiZIDE (GLUCOTROL XL) 10 MG 24 hr tablet Take 1 tablet (10 mg total) by mouth daily.   glucose blood (ACCU-CHEK GUIDE) test strip USE TO CHECK BLOOD GLUCOSE TWICE DAILY AS DIRECTED.   HYDROcodone-acetaminophen (NORCO) 10-325 MG tablet TAKE 1 TABLET BY MOUTH FOUR TIMES DAILY AS NEEDED.   hydrOXYzine (ATARAX) 10 MG tablet TAKE (1) TABLET BY MOUTH (3) TIMES DAILY.   lisinopril (ZESTRIL) 10 MG tablet Take 1 tablet (10 mg total) by mouth daily. (Patient taking differently: Take 20 mg by mouth in the morning.)   metFORMIN (GLUCOPHAGE) 1000 MG tablet TAKE ONE TABLET BY MOUTH TWICE A DAY WITH A MEAL   Multiple Vitamin (MULTIVITAMIN WITH MINERALS) TABS tablet Take 1 tablet by mouth daily. One A Day for Women   Polyethyl Glycol-Propyl Glycol (LUBRICANT EYE DROPS) 0.4-0.3 % SOLN Place 1-2 drops into both eyes 3  (three) times daily as needed (dry/irritated/allergy eyes).   propranolol (INDERAL) 10 MG tablet Take 1 tablet (10 mg total) by mouth 3 (three) times daily. (Patient not taking: Reported on 04/25/2021)   simvastatin (ZOCOR) 40 MG tablet Take 1 tablet (40 mg total) by mouth daily.   [DISCONTINUED] TRULICITY 4.5 XY/5.8PF SOPN INJECT 4.5 MG INTO THE SKIN ONCE WEEKLY   No facility-administered encounter medications on file as of 11/14/2021.    ALLERGIES: Allergies  Allergen Reactions   Peanut-Containing Drug Products Swelling    VACCINATION STATUS: Immunization History  Administered Date(s) Administered  Fluad Quad(high Dose 65+) 03/09/2020, 04/25/2021   PFIZER(Purple Top)SARS-COV-2 Vaccination 06/18/2019, 07/09/2019   PPD Test 12/17/2020   Pneumococcal Conjugate-13 03/09/2020    Diabetes She presents for her follow-up diabetic visit. She has type 2 diabetes mellitus. Onset time: She was diagnosed at approximate age of 89 years. Her disease course has been improving. There are no hypoglycemic associated symptoms. Pertinent negatives for hypoglycemia include no confusion, headaches, pallor or seizures. Pertinent negatives for diabetes include no blurred vision, no chest pain, no fatigue, no polydipsia, no polyphagia and no polyuria. There are no hypoglycemic complications. Symptoms are improving. Diabetic complications include retinopathy. Risk factors for coronary artery disease include diabetes mellitus, dyslipidemia, family history, hypertension, obesity, sedentary lifestyle, post-menopausal and tobacco exposure. Current diabetic treatments: She is currently on Invokana 300 mg p.o. daily metformin/glipizide,\ She is compliant with treatment some of the time. Her weight is decreasing steadily (She lost approximately 10 pounds since last visit.). She is following a generally unhealthy diet. When asked about meal planning, she reported none. She has not had a previous visit with a dietitian. She  never participates in exercise. Her home blood glucose trend is fluctuating minimally. Her breakfast blood glucose range is generally 180-200 mg/dl. Her overall blood glucose range is >200 mg/dl. (She presents with improved A1c of 6.8%, up from 8.1% during her last visit.  However, her most recent average blood glucose is 225 for the last 7 days.  She has lost 15 pounds since last visit.   She is not tolerating the larger dose of Trulicity.  She did not document or report hypoglycemia.      ) An ACE inhibitor/angiotensin II receptor blocker is being taken. Eye exam is current.  Hyperlipidemia This is a chronic problem. The current episode started more than 1 year ago. The problem is controlled. Exacerbating diseases include diabetes and obesity. Pertinent negatives include no chest pain, myalgias or shortness of breath. Current antihyperlipidemic treatment includes statins and ezetimibe. Risk factors for coronary artery disease include dyslipidemia, diabetes mellitus, family history, obesity, hypertension, a sedentary lifestyle and post-menopausal.  Hypertension This is a chronic problem. The current episode started more than 1 year ago. The problem is uncontrolled. Pertinent negatives include no blurred vision, chest pain, headaches, palpitations or shortness of breath. Risk factors for coronary artery disease include diabetes mellitus, dyslipidemia, family history, obesity, post-menopausal state, sedentary lifestyle and smoking/tobacco exposure. Past treatments include ACE inhibitors. Hypertensive end-organ damage includes retinopathy.    Review of systems Limited as above.  Objective:    BP 136/74   Pulse 72   Ht _0  (1.753 m)   Wt 183 lb 9.6 oz (83.3 kg)   BMI 27.11 kg/m   Wt Readings from Last 3 Encounters:  11/14/21 183 lb 9.6 oz (83.3 kg)  07/06/21 197 lb 9.6 oz (89.6 kg)  04/25/21 200 lb (90.7 kg)      Physical Exam- Limited  CMP     Component Value Date/Time   NA 139  10/18/2021 0000   K 4.7 10/18/2021 0000   CL 100 10/18/2021 0000   CO2 23 (A) 10/18/2021 0000   GLUCOSE 245 (H) 04/25/2021 1609   GLUCOSE 281 (H) 05/15/2019 1505   BUN 12 10/18/2021 0000   CREATININE 0.6 10/18/2021 0000   CREATININE 0.53 (L) 04/25/2021 1609   CREATININE 0.64 08/14/2018 1142   CALCIUM 10.3 10/18/2021 0000   PROT 7.1 04/25/2021 1609   ALBUMIN 4.5 10/18/2021 0000   ALBUMIN 5.0 (H) 04/25/2021 1609  AST 15 10/18/2021 0000   ALT 21 10/18/2021 0000   ALKPHOS 109 10/18/2021 0000   BILITOT 0.2 04/25/2021 1609   GFRNONAA 99 03/09/2020 1400   GFRNONAA 92 08/14/2018 1142   GFRAA 114 03/09/2020 1400   GFRAA 107 08/14/2018 1142     Diabetic Labs (most recent): Lab Results  Component Value Date   HGBA1C 6.8 10/18/2021   HGBA1C 8.1 (A) 07/06/2021   HGBA1C 7.6 (H) 12/17/2020     Lipid Panel ( most recent) Lipid Panel     Component Value Date/Time   CHOL 143 10/18/2021 0000   CHOL 163 03/29/2021 1402   TRIG 81 10/18/2021 0000   HDL 68 10/18/2021 0000   HDL 69 03/29/2021 1402   LDLCALC 59 10/18/2021 0000   LDLCALC 77 03/29/2021 1402     Assessment & Plan:   1. Uncontrolled type 2 diabetes mellitus with hyperglycemia ([place])  - Melissa Tucker has currently uncontrolled symptomatic type 2 DM since 71 years of age.  She presents with improved A1c of 6.8%, up from 8.1% during her last visit.  However, her most recent average blood glucose is 225 for the last 7 days.  She has lost 15 pounds since last visit.   She is not tolerating the larger dose of Trulicity.  She did not document or report hypoglycemia.    -her diabetes is complicated by retinopathy, obesity/sedentary life, history of smoking and Melissa Tucker remains at a high risk for more acute and chronic complications which include CAD, CVA, CKD, retinopathy, and neuropathy. These are all discussed in detail with the patient.  - I have counseled her on diet management and weight loss, by adopting a carbohydrate  restricted/protein rich diet.  - she acknowledges that there is a room for improvement in her food and drink choices. - Suggestion is made for her to avoid simple carbohydrates  from her diet including Cakes, Sweet Desserts, Ice Cream, Soda (diet and regular), Sweet Tea, Candies, Chips, Cookies, Store Bought Juices, Alcohol , Artificial Sweeteners,  Coffee Creamer, and "Sugar-free" Products, Lemonade. This will help patient to have more stable blood glucose profile and potentially avoid unintended weight gain.  The following Lifestyle Medicine recommendations according to [place]  [facility]) were discussed and and offered to patient and she  agrees to start the journey:  A. Whole Foods, Plant-Based Nutrition comprising of fruits and vegetables, plant-based proteins, whole-grain carbohydrates was discussed in detail with the patient.   A list for source of those nutrients were also provided to the patient.  Patient will use only water or unsweetened tea for hydration. B.  The need to stay away from risky substances including alcohol, smoking; obtaining 7 to 9 hours of restorative sleep, at least 150 minutes of moderate intensity exercise weekly, the importance of healthy social connections,  and stress management techniques were discussed. C.  A full color page of  Calorie density of various food groups per pound showing examples of each food groups was provided to the patient.    - I encouraged her to switch to  unprocessed or minimally processed complex starch and increased protein intake (animal or plant source), fruits, and vegetables.  - she is advised to stick to a routine mealtimes to eat 3 meals  a day and avoid unnecessary snacks ( to snack only to correct hypoglycemia).   - I have approached her with the following individualized plan to manage diabetes and patient agrees:   -In  light of the fact that she is presenting with significant improvement in her glycemic  profile with A1c of 6.8%, a loss of 15 pounds since last visit, and and the fact that she would like to avoid insulin treatment, she remains to be a good candidate for lifestyle medicine , see above. -In the meantime, she is advised to lower her Trulicity to 3 mg subcutaneously weekly, continue metformin 1000 mg p.o. twice dail-daily after breakfast and after supper.    -She is advised to continue glipizide 10 mg p.o. daily at breakfast. -She is advised and willing to continue monitoring blood glucose twice a day-daily before breakfast and at bedtime.    -She is encouraged to call clinic for blood glucose readings less than 70 or greater than 200x3.     2) BP/HTN:  -Her blood pressure is controlled to target.  She is advised to continue her current medications including lisinopril 40 mg p.o. daily, Lasix 40 mg p.o. daily . She will be considered for additional treatment with hydrochlorothiazide on her next visit.   3) Lipids/HPL: Her recent lipid panel showed controlled LDL at 65.  She is advised to continue simvastatin 40 mg p.o. nightly.  Side effects and precautions discussed with her.      4)  Weight/Diet: Her BMI 27.1--  She is a candidate for  some more  weight loss.  CDE Consult will be initiated , exercise, and detailed carbohydrates information provided.  5) Chronic Care/Health Maintenance:  -she  is on ACEI and Statin medications and  is encouraged to initiate and continue to follow up with Ophthalmology, Dentist,  Podiatrist at least yearly or according to recommendations, and advised to  stay away from smoking. I have recommended yearly flu vaccine and pneumonia vaccine at least every 5 years; moderate intensity exercise for up to 150 minutes weekly; and  sleep for at least 7 hours a day.  She did have normal screening ABI on May 11, 2020.  This study will be repeated in February 2027, or sooner if needed.  - I advised patient to maintain close follow up with Benita Stabile,  MD for primary care needs.    I spent 40 minutes in the care of the patient today including review of labs from CMP, Lipids, Thyroid Function, Hematology (current and previous including abstractions from other facilities); face-to-face time discussing  her blood glucose readings/logs, discussing hypoglycemia and hyperglycemia episodes and symptoms, medications doses, her options of short and long term treatment based on the latest standards of care / guidelines;  discussion about incorporating lifestyle medicine;  and documenting the encounter. Risk reduction counseling performed per USPSTF guidelines to reduce obesity and cardiovascular risk factors.     Please refer to Patient Instructions for Blood Glucose Monitoring and Insulin/Medications Dosing Guide"  in media tab for additional information. Please  also refer to " Patient Self Inventory" in the Media  tab for reviewed elements of pertinent patient history.  Melissa Tucker participated in the discussions, expressed understanding, and voiced agreement with the above plans.  All questions were answered to her satisfaction. she is encouraged to contact clinic should she have any questions or concerns prior to her return visit.   Follow up plan: - Return in about 3 months (around 02/14/2022) for Bring Meter/CGM Device/Logs- A1c in Office.  Marquis Lunch, MD [facility] [address] [place], [place] [address] Phone: [phone]  Fax: [phone]    11/14/2021, 4:29 PM  This note was partially dictated with voice recognition software. Similar sounding words can be transcribed inadequately or may not  be corrected upon review.

## 2021-11-14 NOTE — Patient Instructions (Signed)

## 2021-11-22 ENCOUNTER — Other Ambulatory Visit: Payer: Self-pay | Admitting: Adult Health

## 2021-11-22 ENCOUNTER — Other Ambulatory Visit (HOSPITAL_COMMUNITY): Payer: Self-pay | Admitting: Family Medicine

## 2021-11-22 DIAGNOSIS — Z2839 Other underimmunization status: Secondary | ICD-10-CM | POA: Diagnosis not present

## 2021-11-22 DIAGNOSIS — Z13820 Encounter for screening for osteoporosis: Secondary | ICD-10-CM | POA: Diagnosis not present

## 2021-11-22 DIAGNOSIS — G894 Chronic pain syndrome: Secondary | ICD-10-CM | POA: Diagnosis not present

## 2021-11-22 DIAGNOSIS — R809 Proteinuria, unspecified: Secondary | ICD-10-CM | POA: Diagnosis not present

## 2021-11-22 DIAGNOSIS — R112 Nausea with vomiting, unspecified: Secondary | ICD-10-CM | POA: Diagnosis not present

## 2021-11-22 DIAGNOSIS — G4700 Insomnia, unspecified: Secondary | ICD-10-CM | POA: Diagnosis not present

## 2021-11-22 DIAGNOSIS — E785 Hyperlipidemia, unspecified: Secondary | ICD-10-CM | POA: Diagnosis not present

## 2021-11-22 DIAGNOSIS — D649 Anemia, unspecified: Secondary | ICD-10-CM | POA: Diagnosis not present

## 2021-11-22 DIAGNOSIS — E1165 Type 2 diabetes mellitus with hyperglycemia: Secondary | ICD-10-CM | POA: Diagnosis not present

## 2021-11-22 DIAGNOSIS — I10 Essential (primary) hypertension: Secondary | ICD-10-CM | POA: Diagnosis not present

## 2021-11-23 ENCOUNTER — Telehealth: Payer: Self-pay

## 2021-11-23 MED ORDER — FLUCONAZOLE 150 MG PO TABS
ORAL_TABLET | ORAL | 1 refills | Status: DC
Start: 2021-11-23 — End: 2021-12-05

## 2021-11-23 NOTE — Telephone Encounter (Signed)
Refilled diflucan 

## 2021-11-23 NOTE — Telephone Encounter (Signed)
Pt is requesting Fluconazole for a yeast infection. Thanks!! JSY

## 2021-11-23 NOTE — Telephone Encounter (Signed)
Patient called and stated that she would like for [facility] to call her in something for a yeast infection.

## 2021-11-23 NOTE — Telephone Encounter (Signed)
Left message @ 10:00 am. JSY 

## 2021-12-05 ENCOUNTER — Other Ambulatory Visit: Payer: Self-pay | Admitting: Adult Health

## 2021-12-08 ENCOUNTER — Telehealth: Payer: Self-pay | Admitting: *Deleted

## 2021-12-08 NOTE — Patient Outreach (Signed)
  Care Coordination   Initial Visit Note   12/08/2021 Name: SHAMIRACLE GORDEN MRN: [id number] DOB: 1951-01-31  ERAN WINDISH is a 71 y.o. year old female who sees Margo Aye, Kathleene Hazel, MD for primary care. I spoke with  Ricardo Jericho by phone today.  What matters to the patients health and wellness today?  Right now I am grieving the loss of my son.     Goals Addressed   None     SDOH assessments and interventions completed:  Yes     Care Coordination Interventions Activated:  Yes  Care Coordination Interventions:  Yes, provided   Follow up plan: Follow up call scheduled for [place] [id number] 10:15    Encounter Outcome:  Pt. Visit Completed

## 2021-12-16 ENCOUNTER — Other Ambulatory Visit: Payer: Medicare Other

## 2021-12-22 DIAGNOSIS — E119 Type 2 diabetes mellitus without complications: Secondary | ICD-10-CM | POA: Diagnosis not present

## 2021-12-22 DIAGNOSIS — H25813 Combined forms of age-related cataract, bilateral: Secondary | ICD-10-CM | POA: Diagnosis not present

## 2021-12-22 DIAGNOSIS — H40003 Preglaucoma, unspecified, bilateral: Secondary | ICD-10-CM | POA: Diagnosis not present

## 2021-12-23 ENCOUNTER — Other Ambulatory Visit: Payer: Self-pay | Admitting: Adult Health

## 2021-12-23 ENCOUNTER — Ambulatory Visit: Payer: Self-pay | Admitting: *Deleted

## 2021-12-26 ENCOUNTER — Encounter: Payer: Self-pay | Admitting: *Deleted

## 2021-12-26 NOTE — Patient Outreach (Signed)
  Care Coordination   Initial Visit Note   12/26/2021  Name: Melissa Tucker MRN: [id number] DOB: [birth year]  Melissa Tucker is a 71 y.o. year old female who sees [place] Crane, Edwinna Areola, MD for primary care. I spoke with Ulyses Jarred by phone today.  What matters to the patients health and wellness today?   Reduce and Manage Symptoms of Depression and Grief.   Goals Addressed               This Visit's Progress     COMPLETED: Begin and Stick with Counseling-Depression (pt-stated)        Timeframe:  Long-Range Goal Priority:  High Start Date:  04/08/21                           Expected End Date:           06/07/21            Follow Up Date 05/05/21    - check out counseling - keep 90 percent of counseling appointments - ex[ect call from [place] to schedule counseling appointment    Why is this important?   Beating depression may take some time.  If you don't feel better right away, don't give up on your treatment plan.    Notes:       Reduce and Manage Symptoms of Depression and Grief. (pt-stated)   On track     Care Coordination Interventions:  PHQ2/PHQ9 Depression Screen Completed & Results Reviewed.   Suicidal Ideation/Homicidal Ideation Assessed - None Present. Solution-Focused Strategies Employed. Deep Breathing Exercises, Relaxation Techniques & Mindfulness Meditation Strategies Taught & Encouraged Daily.   Active Listening/Reflection Utilized.  Emotional Support Provided. Verbalization of Feelings Encouraged.  Problem Solving/Task Centered Solutions Developed.   Provided Psychoeducation for Mental Health Concerns. Reviewed Mental Health Medications & Discussed Importance of Compliance. Quality of Sleep Assessed & Sleep Hygiene Techniques Promoted. Participation in Counseling Encouraged. Discussed Referral to Psychiatrist for Psychotropic Medication Management. Discussed Referral to Therapist for Psychotherapeutic Counseling Services. Review List of Counseling Agencies  and Resources, and Be Prepared to Discuss During Our Next Scheduled Telephone [employer].         SDOH assessments and interventions completed:  Yes.  SDOH Interventions Today    Flowsheet Row Most Recent Value  SDOH Interventions   Food Insecurity Interventions Intervention Not Indicated  Housing Interventions Intervention Not Indicated  Transportation Interventions Intervention Not Indicated  Utilities Interventions Intervention Not Indicated  Alcohol Usage Interventions Intervention Not Indicated (Score <7)  Depression Interventions/Treatment  Referral to Psychiatry, Medication, Counseling  Financial Strain Interventions Intervention Not Indicated  Physical Activity Interventions Patient Refused  Stress Interventions Intervention Not Indicated  Social Connections Interventions Patient Refused        Care Coordination Interventions Activated:  Yes.   Care Coordination Interventions:  Yes, provided.   Follow up plan: Follow up call scheduled for 01/02/2022 at 11:00 am.  Encounter Outcome:  Pt. Visit Completed.   Nat Christen, BSW, MSW, LCSW  Licensed Education officer, [facility]  [address] [place] [address], [place], [place] [address] Physical Address-[address], [place], [place] [address] Toll Free Main # [phone] Fax # [phone] Cell # [phone] [email or web address]

## 2021-12-26 NOTE — Patient Instructions (Addendum)
Visit Information  Thank you for taking time to visit with me today. Please don't hesitate to contact me if I can be of assistance to you.   Following are the goals we discussed today:   Goals Addressed               This Visit's Progress     COMPLETED: Begin and Stick with Counseling-Depression (pt-stated)        Timeframe:  Long-Range Goal Priority:  High Start Date:  04/08/21                           Expected End Date:           06/07/21            Follow Up Date 05/05/21    - check out counseling - keep 90 percent of counseling appointments - ex[ect call from [place] to schedule counseling appointment    Why is this important?   Beating depression may take some time.  If you don't feel better right away, don't give up on your treatment plan.    Notes:       Reduce and Manage Symptoms of Depression and Grief. (pt-stated)   On track     Care Coordination Interventions:  PHQ2/PHQ9 Depression Screen Completed & Results Reviewed.   Suicidal Ideation/Homicidal Ideation Assessed - None Present. Solution-Focused Strategies Employed. Deep Breathing Exercises, Relaxation Techniques & Mindfulness Meditation Strategies Taught & Encouraged Daily.   Active Listening/Reflection Utilized.  Emotional Support Provided. Verbalization of Feelings Encouraged.  Problem Solving/Task Centered Solutions Developed.   Provided Psychoeducation for Mental Health Concerns. Reviewed Mental Health Medications & Discussed Importance of Compliance. Quality of Sleep Assessed & Sleep Hygiene Techniques Promoted. Participation in Counseling Encouraged. Discussed Referral to Psychiatrist for Psychotropic Medication Management. Discussed Referral to Therapist for Psychotherapeutic Counseling Services. Review List of Counseling Agencies and Resources, and Be Prepared to Discuss During Our Next Scheduled Telephone [employer].         Our next appointment is by telephone on 01/02/2022 at 11:00  am.  Please call the care guide team at [phone] if you need to cancel or reschedule your appointment.   If you are experiencing a Mental Health or [place] or need someone to talk to, please call the Suicide and Crisis Lifeline: 988 call the [place] National Suicide Prevention Lifeline: 215-579-4972 or TTY: (801)751-3427 TTY 782-449-1603) to talk to a trained counselor call 1-800-273-TALK (toll free, 24 hour hotline) go to [facility] Urgent Care [address], [place] [phone]) call the [place]: [phone] call 911  Patient verbalizes understanding of instructions and care plan provided today and agrees to view in [place]. Active MyChart status and patient understanding of how to access instructions and care plan via MyChart confirmed with patient.     Follow up with provider on 01/02/2022 at 01/02/2022 at 11:00 am.  Nat Christen, BSW, MSW, [place]  Licensed Clinical Social Worker  [place]  Mailing [place]. [address], [place], [place] [address] Physical Address-[address] [place], [place] [address] Toll Free Main # [phone] Fax # [phone] Cell # [phone] [email or web address]

## 2022-01-02 ENCOUNTER — Encounter: Payer: Self-pay | Admitting: *Deleted

## 2022-01-02 ENCOUNTER — Ambulatory Visit: Payer: Self-pay | Admitting: *Deleted

## 2022-01-02 NOTE — Patient Instructions (Signed)
Visit Information  Thank you for taking time to visit with me today. Please don't hesitate to contact me if I can be of assistance to you.   Following are the goals we discussed today:   Goals Addressed               This Visit's Progress     Reduce and Manage Symptoms of Depression and Grief. (pt-stated)   On track     Care Coordination Interventions:  Deep Breathing Exercises, Relaxation Techniques & Mindfulness Meditation Strategies Reviewed & Encouraged Daily.   Active Listening/Reflection Utilized.  Emotional Support Provided. Verbalization of Feelings Employed. Continued Discussion of Referral to Psychiatrist for Psychotropic Medication Management. Continued Discussion of Referral to Therapist for Psychotherapeutic Counseling Services. Reviewed List of Counseling Agencies and Resources, to Ensure Understanding, as well as to Assist with the Referral Process.          Our next appointment is by telephone on 01/12/2022 at 11:30 am.  Please call the care guide team at [phone] if you need to cancel or reschedule your appointment.   If you are experiencing a Mental Health or [place] or need someone to talk to, please call the Suicide and Crisis Lifeline: 988 call the [place] National Suicide Prevention Lifeline: 828-366-1363 or TTY: (226)488-7993 TTY 352-314-6601) to talk to a trained counselor call 1-800-273-TALK (toll free, 24 hour hotline) go to [facility] [address], [place] [phone]) call the [place]: (737) 854-2006 call 911  Patient verbalizes understanding of instructions and care plan provided today and agrees to view in [place]. Active MyChart status and patient understanding of how to access instructions and care plan via MyChart confirmed with patient.     Telephone follow up appointment with care management team member scheduled for:  01/12/2022 at 11:30 am.  Nat Christen, BSW, MSW, [place]  Licensed Clinical Social Worker  [place]  Mailing [place]. [address], [place], [place] [address] Physical Address-[address], [place], [place] [address] Toll Free Main # [phone] Fax # [phone] Cell # [phone] [email or web address]

## 2022-01-02 NOTE — Patient Outreach (Signed)
  Care Coordination   Follow Up Visit Note   01/02/2022  Name: RILYA LONGO MRN: [id number] DOB: 02-Feb-1951  ALIZ MERITT is a 71 y.o. year old female who sees Nevada Crane, Edwinna Areola, MD for primary care. I spoke with Ulyses Jarred by phone today.  What matters to the patients health and wellness today?  Reduce and Manage Symptoms of Depression and Grief.   Goals Addressed               This Visit's Progress     Reduce and Manage Symptoms of Depression and Grief. (pt-stated)   On track     Care Coordination Interventions:  Deep Breathing Exercises, Relaxation Techniques & Mindfulness Meditation Strategies Reviewed & Encouraged Daily.   Active Listening/Reflection Utilized.  Emotional Support Provided. Verbalization of Feelings Employed. Continued Discussion of Referral to Psychiatrist for Psychotropic Medication Management. Continued Discussion of Referral to Therapist for Psychotherapeutic Counseling Services. Reviewed List of Counseling Agencies and Resources, to Ensure Understanding, as well as to Assist with the Referral Process.          SDOH assessments and interventions completed:  Yes.   Care Coordination Interventions Activated:  Yes.    Care Coordination Interventions:  Yes, provided.    Follow up plan: Follow up Appointment scheduled on 01/12/2022 at 11:30 am.   Encounter Outcome:  Pt. Visit Completed.    Nat Christen, BSW, MSW, LCSW  Licensed Education officer, environmental Health System  Mailing [place] [address]. [address], [place], [place] [address] Physical Address-[address], [place], [place] [address] Toll Free Main # [phone] Fax # [phone] Cell # [phone] [email or web address]

## 2022-01-03 ENCOUNTER — Other Ambulatory Visit: Payer: Self-pay | Admitting: "Endocrinology

## 2022-01-11 ENCOUNTER — Other Ambulatory Visit: Payer: Self-pay | Admitting: Adult Health

## 2022-01-12 ENCOUNTER — Encounter: Payer: Self-pay | Admitting: *Deleted

## 2022-01-12 ENCOUNTER — Ambulatory Visit: Payer: Self-pay | Admitting: *Deleted

## 2022-01-12 NOTE — Patient Outreach (Signed)
  Care Coordination   Follow Up Visit Note   01/12/2022  Name: Melissa Tucker MRN: [id number] DOB: February 24, 1951  Melissa Tucker is a 71 y.o. year old female who sees Nevada Crane, Edwinna Areola, MD for primary care. I spoke with Ulyses Jarred by phone today.  What matters to the patients health and wellness today?  Reduce and Manage Symptoms of Depression and Grief.    Goals Addressed               This Visit's Progress     Reduce and Manage Symptoms of Depression and Grief. (pt-stated)   On track     Care Coordination Interventions:  Deep Breathing Exercises, Relaxation Techniques & Mindfulness Meditation Strategies Encouraged Daily.   Active Listening/Reflection Utilized.  Emotional Support Provided. Verbalization of Feelings Encouraged. Mailed the Following List of Resources on 01/12/2022: ~ Are you Grieving - Support Through Loss and Transition ~ Counseling Agencies in [place] ~ Pharmacologist ~ [place]  ~ [place] ~ Loneliness and Isolation ~ Relaxation Techniques for Depression ~ Stress Management - Breathing Exercises for Relaxation  Please Be Prepared to Discuss the Following List of Resources During Our Next Scheduled Telephone [employer].       SDOH assessments and interventions completed:  Yes.   Care Coordination Interventions Activated:  Yes.    Care Coordination Interventions:  Yes, provided.    Follow up plan: Follow up call scheduled for 01/23/2022 at 10:45 am.   Encounter Outcome:  Pt. Visit Completed.    Nat Christen, BSW, MSW, LCSW  Licensed Education officer, environmental Health System  Mailing [place] [address]. [address] [place], [place] [address] Physical Address-[address] [place], [place] [address] Toll Free Main # [phone] Fax # [phone] Cell # [phone] [name]

## 2022-01-12 NOTE — Patient Instructions (Signed)
Visit Information  Thank you for taking time to visit with me today. Please don't hesitate to contact me if I can be of assistance to you.   Following are the goals we discussed today:   Goals Addressed               This Visit's Progress     Reduce and Manage Symptoms of Depression and Grief. (pt-stated)   On track     Care Coordination Interventions:  Deep Breathing Exercises, Relaxation Techniques & Mindfulness Meditation Strategies Encouraged Daily.   Active Listening/Reflection Utilized.  Emotional Support Provided. Verbalization of Feelings Encouraged. Mailed the Following List of Resources on 01/12/2022: ~ Are you Grieving - Support Through Loss and Transition ~ Counseling Agencies in [place] ~ Pharmacologist ~ [place]  ~ [place] ~ Loneliness and Isolation ~ Relaxation Techniques for Depression ~ Stress Management - Breathing Exercises for Relaxation  Please Be Prepared to Discuss the Following List of Resources During Our Next Scheduled Telephone [employer].         Our next appointment is by telephone on 01/23/2022 at 10:45 am.  Please call the care guide team at [phone] if you need to cancel or reschedule your appointment.   If you are experiencing a Mental Health or [place] or need someone to talk to, please call the Suicide and Crisis Lifeline: 988 call the [place] National Suicide Prevention Lifeline: 502-158-7880 or TTY: (713)403-8064 TTY 5308376240) to talk to a trained counselor call 1-800-273-TALK (toll free, 24 hour hotline) go to [facility] Urgent Care [address] [place] [phone]) call the [place]: [phone] call 911  Patient verbalizes understanding of instructions and care plan provided today and agrees to view in [place]. Active MyChart status and patient understanding of how to access  instructions and care plan via MyChart confirmed with patient.     Telephone follow up appointment with care management team member scheduled for:  01/23/2022 at 10:45 am.  Nat Christen, BSW, MSW, [place]  Licensed Clinical Social Worker  [place]  Mailing [place]. [address], [place], [place] [address] Physical Address-[address] [place], [place] [address] Toll Free Main # [phone] Fax # [phone] Cell # [phone] [email or web address]

## 2022-01-20 ENCOUNTER — Other Ambulatory Visit: Payer: Self-pay | Admitting: "Endocrinology

## 2022-01-23 ENCOUNTER — Encounter: Payer: Self-pay | Admitting: *Deleted

## 2022-01-23 ENCOUNTER — Ambulatory Visit: Payer: Self-pay | Admitting: *Deleted

## 2022-01-23 NOTE — Patient Instructions (Signed)
Visit Information  Thank you for taking time to visit with me today. Please don't hesitate to contact me if I can be of assistance to you.   Following are the goals we discussed today:   Goals Addressed               This Visit's Progress     Reduce and Manage Symptoms of Depression and Grief. (pt-stated)   On track     Care Coordination Interventions:  Active Listening/Reflection Utilized.  Emotional Support Provided. Verbalization of Feelings Encouraged. Cognitive Behavioral Therapy Initiated. Mailed the Following List of Resources on 01/12/2022, and Again on 01/23/2022, as Patient Denied Receipt: ~ Are you Grieving - Support Through Loss and Transition ~ Counseling Agencies in [place] ~ Pharmacologist ~ [place]  ~ [place] ~ Loneliness and Isolation ~ Relaxation Techniques for Depression ~ Stress Management - Breathing Exercises for Relaxation  Please Be Prepared to Discuss the Following List of Resources During Our Next Scheduled Telephone [employer].         Our next appointment is by telephone on 02/06/2022 at 10:30 am.  Please call the care guide team at [phone] if you need to cancel or reschedule your appointment.   If you are experiencing a Mental Health or [place] or need someone to talk to, please call the Suicide and Crisis Lifeline: 988 call the [place] National Suicide Prevention Lifeline: 620-849-7254 or TTY: (510) 790-0077 TTY (253) 138-0433) to talk to a trained counselor call 1-800-273-TALK (toll free, 24 hour hotline) go to [facility] Urgent Care [address], [place] [phone]) call the [place]: [phone] call 911  Patient verbalizes understanding of instructions and care plan provided today and agrees to view in [place]. Active MyChart status and patient understanding of how to access instructions and care  plan via MyChart confirmed with patient.     Telephone follow up appointment with care management team member scheduled for: 02/06/2022 at 10:30 am.  Nat Christen, BSW, MSW, [place]  Licensed Clinical Social Worker  [place]  Mailing [place]. [address], [place], [place] [address] Physical Address-[address] [place], [place] [address] Toll Free Main # [phone] Fax # [phone] Cell # [phone] [email or web address]

## 2022-01-23 NOTE — Patient Outreach (Signed)
  Care Coordination   Follow Up Visit Note   01/23/2022  Name: Melissa Tucker MRN: [id number] DOB: [birth year]  Melissa Tucker is a 71 y.o. year old female who sees Nevada Crane, Edwinna Areola, MD for primary care. I spoke with Ulyses Jarred by phone today.  What matters to the patients health and wellness today?  Reduce and Manage Symptoms of Depression and Grief.    Goals Addressed               This Visit's Progress     Reduce and Manage Symptoms of Depression and Grief. (pt-stated)   On track     Care Coordination Interventions:  Active Listening/Reflection Utilized.  Emotional Support Provided. Verbalization of Feelings Encouraged. Cognitive Behavioral Therapy Initiated. Mailed the Following List of Resources on 01/12/2022, and Again on 01/23/2022, as Patient Denied Receipt: ~ Are you Grieving - Support Through Loss and Transition ~ Counseling Agencies in [place] ~ Pharmacologist ~ [place]  ~ [place] ~ Loneliness and Isolation ~ Relaxation Techniques for Depression ~ Stress Management - Breathing Exercises for Relaxation  Please Be Prepared to Discuss the Following List of Resources During Our Next Scheduled Telephone [employer].         SDOH assessments and interventions completed:  Yes.  Care Coordination Interventions Activated:  Yes.   Care Coordination Interventions:  Yes, provided.   Follow up plan: Follow up call scheduled for 02/06/2022 at 10:30 am.  Encounter Outcome:  Pt. Visit Completed.   Nat Christen, BSW, MSW, LCSW  Licensed Education officer, environmental Health System  Mailing [place] [address]. [address] [place], [place] [address] Physical Address-[address], [place],  [address] Toll Free Main # [phone] Fax # [phone] Cell # [phone] [email or web address]

## 2022-01-24 ENCOUNTER — Other Ambulatory Visit: Payer: Self-pay | Admitting: Adult Health

## 2022-01-25 ENCOUNTER — Encounter: Payer: Self-pay | Admitting: Adult Health

## 2022-01-25 ENCOUNTER — Ambulatory Visit (INDEPENDENT_AMBULATORY_CARE_PROVIDER_SITE_OTHER): Payer: Medicare Other | Admitting: Adult Health

## 2022-01-25 VITALS — BP 125/70 | HR 101 | Ht 69.0 in | Wt 200.0 lb

## 2022-01-25 DIAGNOSIS — N949 Unspecified condition associated with female genital organs and menstrual cycle: Secondary | ICD-10-CM

## 2022-01-25 DIAGNOSIS — L900 Lichen sclerosus et atrophicus: Secondary | ICD-10-CM | POA: Diagnosis not present

## 2022-01-25 DIAGNOSIS — R350 Frequency of micturition: Secondary | ICD-10-CM | POA: Diagnosis not present

## 2022-01-25 DIAGNOSIS — Z90710 Acquired absence of both cervix and uterus: Secondary | ICD-10-CM | POA: Diagnosis not present

## 2022-01-25 LAB — POCT URINALYSIS DIPSTICK
Blood, UA: NEGATIVE
Glucose, UA: POSITIVE — AB
Ketones, UA: NEGATIVE
Leukocytes, UA: NEGATIVE
Nitrite, UA: NEGATIVE
Protein, UA: POSITIVE — AB

## 2022-01-25 MED ORDER — FLUCONAZOLE 150 MG PO TABS: ORAL_TABLET | ORAL | 1 refills | Status: DC

## 2022-01-25 NOTE — Progress Notes (Signed)
  Subjective:     Patient ID: Melissa Tucker, female   DOB: May 16, 1950, 71 y.o.   MRN: [id number]  HPI Varvara is a 71 year old black female, divorced, sp hysterectomy in complaining of urinary frequency and pressure, esp in am. Has vaginal irritation with burning at introitus, has LSA. She says her sugar was up this morning.  Her son died about 3 months ago, he was 66.   PCP is Dr Nevada Crane.   Review of Systems Urinary frequency and pressure Vaginal irritation with burning at introitus Not having sex  Reviewed past medical,surgical, social and family history. Reviewed medications and allergies.     Objective:   Physical Exam BP 125/70 (BP Location: Left Arm, Patient Position: Sitting, Cuff Size: Normal)   Pulse (!) 101   Ht 5\' 9"  (1.753 m)   Wt 200 lb (90.7 kg)   BMI 29.53 kg/m  Urine dipstick was +glucose,250 and +protein,30. Skin warm and dry.Pelvic: external genitalia is normal in appearance, has redness at introitus.vagina:pale.  Fall is low  Upstream - 01/25/22 1416       Pregnancy Intention Screening   Does the patient want to become pregnant in the next year? N/A    Does the patient's partner want to become pregnant in the next year? N/A    Would the patient like to discuss contraceptive options today? N/A      Contraception Wrap Up   Current Method Female Sterilization   hyst   End Method Female Sterilization   hyst   Contraception Counseling Provided No               Examination chaperoned by Levy Pupa LPN  Assessment:     1. Urinary frequency Urine +glucose and protein UA C&S sent to rule out UTI   2. Lichen sclerosus et atrophicus Red at introitus  Use temovate 2-3 x weekly, has refills   3. Vaginal discomfort Will rx diflucan Meds ordered this encounter  Medications   fluconazole (DIFLUCAN) 150 MG tablet    Sig: Take 1 now and 1 in 3 days if needed DO NOT take ZOCOR when takes Diflucan    Dispense:  2 tablet    Refill:  1    Order Specific  Question:   Supervising Provider    Answer:   Florian Buff [[id number]]   Sleep without panties She says feels better when dry, will try pose pad   4. S/P hysterectomy    Plan:     Follow up in 6 months for recheck on LSA Will talk when urine back

## 2022-01-25 NOTE — Addendum Note (Signed)
Addended by: Linton Rump on: 01/25/2022 03:13 PM   Modules accepted: Orders

## 2022-01-26 LAB — URINALYSIS, ROUTINE W REFLEX MICROSCOPIC
Bilirubin, UA: NEGATIVE
Leukocytes,UA: NEGATIVE
Nitrite, UA: NEGATIVE
RBC, UA: NEGATIVE
Specific Gravity, UA: >=1.03 — AB (ref 1.005–1.030)
Urobilinogen, Ur: 1 mg/dL (ref 0.2–1.0)
pH, UA: 5.5 (ref 5.0–7.5)

## 2022-01-28 LAB — URINE CULTURE

## 2022-02-06 ENCOUNTER — Ambulatory Visit: Payer: Self-pay | Admitting: *Deleted

## 2022-02-06 ENCOUNTER — Other Ambulatory Visit: Payer: Self-pay | Admitting: "Endocrinology

## 2022-02-06 ENCOUNTER — Encounter: Payer: Self-pay | Admitting: *Deleted

## 2022-02-06 DIAGNOSIS — E1165 Type 2 diabetes mellitus with hyperglycemia: Secondary | ICD-10-CM

## 2022-02-06 NOTE — Patient Instructions (Signed)
Visit Information  Thank you for taking time to visit with me today. Please don't hesitate to contact me if I can be of assistance to you.   Following are the goals we discussed today:   Goals Addressed               This Visit's Progress     Reduce and Manage Symptoms of Depression and Grief. (pt-stated)   On track     Care Coordination Interventions:  Active Listening Utilized.  Emotional Support Provided. Cognitive Behavioral Therapy Initiated. Consider Attending a Grief and Loss Support Group. Confirmed Receipt, and Thorough Review of the Following List of Resources: ~ Are you Grieving - Support Through Loss and Transition ~ Counseling Agencies in [place] ~ Pharmacologist ~ [place]  ~ [place] ~ Loneliness and Isolation ~ Relaxation Techniques for Depression ~ Stress Management - Breathing Exercises for Relaxation  Encouraged to Continue to Review List of [employer] and Resources in [place], and Begin [employer] of Interest, in An Effort to [employer].         Our next appointment is by telephone on 02/20/2022 at 9:00 am.  Please call the care guide team at [phone] if you need to cancel or reschedule your appointment.   If you are experiencing a Mental Health or [place] or need someone to talk to, please call the Suicide and Crisis Lifeline: 988 call the [place] National Suicide Prevention Lifeline: (805)652-9632 or TTY: (539)047-6733 TTY 458-376-3504) to talk to a trained counselor call 1-800-273-TALK (toll free, 24 hour hotline) go to [facility] Urgent Care [address] [place] [phone]) call the [place]: [phone] call 911  Patient verbalizes understanding of instructions and care plan provided today and agrees to view in [place]. Active MyChart status and patient understanding of how  to access instructions and care plan via MyChart confirmed with patient.     Telephone follow up appointment with care management team member scheduled for:  02/20/2022 at 9:00 am.  Nat Christen, BSW, MSW, [place]  Licensed Clinical Social Worker  [place]  Mailing [place]. [address], [place], [place] [address] Physical Address-[address], [place], [place] [address] Toll Free Main # [phone] Fax # [phone] Cell # [phone] [email or web address]

## 2022-02-06 NOTE — Patient Outreach (Signed)
  Care Coordination   Follow Up Visit Note   02/06/2022  Name: Melissa Tucker MRN: [id number] DOB: 1950/08/09  Melissa Tucker is a 71 y.o. year old female who sees Nevada Crane, Edwinna Areola, MD for primary care. I spoke with Ulyses Jarred by phone today.  What matters to the patients health and wellness today?   Reduce and Manage Symptoms of Depression and Grief.   Goals Addressed               This Visit's Progress     Reduce and Manage Symptoms of Depression and Grief. (pt-stated)   On track     Care Coordination Interventions:  Active Listening Utilized.  Emotional Support Provided. Cognitive Behavioral Therapy Initiated. Consider Attending a Grief and Loss Support Group. Confirmed Receipt, and Thorough Review of the Following List of Resources: ~ Are you Grieving - Support Through Loss and Transition ~ Counseling Agencies in [place] ~ Pharmacologist ~ [place]  ~ [place] ~ Loneliness and Isolation ~ Relaxation Techniques for Depression ~ Stress Management - Breathing Exercises for Relaxation  Encouraged to Continue to Review List of [employer] and Resources in [place], and Begin [employer] of Interest, in An Effort to [employer].       SDOH assessments and interventions completed:  Yes.  Care Coordination Interventions Activated:  Yes.   Care Coordination Interventions:  Yes, provided.   Follow up plan: Follow up call scheduled for 02/20/2022 at 9:00 am.  Encounter Outcome:  Pt. Visit Completed.   Nat Christen, BSW, MSW, LCSW  Licensed Education officer, environmental Health System  Mailing [place] N. [address] [place], [place] [address] Physical Address-[address] [place], [place] [address] Toll Free Main # [phone] Fax # [phone] Cell # [phone] [email or web address]

## 2022-02-14 ENCOUNTER — Ambulatory Visit: Payer: Medicare Other | Admitting: "Endocrinology

## 2022-02-20 ENCOUNTER — Encounter: Payer: Self-pay | Admitting: *Deleted

## 2022-02-20 ENCOUNTER — Ambulatory Visit: Payer: Self-pay | Admitting: *Deleted

## 2022-02-20 NOTE — Patient Instructions (Signed)
Visit Information  Thank you for taking time to visit with me today. Please don't hesitate to contact me if I can be of assistance to you.   Following are the goals we discussed today:   Goals Addressed               This Visit's Progress     Reduce and Manage Symptoms of Depression and Grief. (pt-stated)   On track     Care Coordination Interventions:  Active Listening Utilized.  Verbalization of Feelings Encouraged. Emotional Support Provided. Cognitive Behavioral Therapy Initiated. Solution-Focused Strategies Employed. Please Review GriefShare Support Group Meeting Calendar, Emailed on 02/20/2022, and Consider Self-Enrollment in a Support Group of Interest:  [facility] - In Person [address] [place], [place]   Tuesdays at 6:30 pm Jan 17, 2022 - May 16, 2022  [employer] - In Person [address] [place], [place]   Thursdays at 6:30 pm Feb 09, 2022 - May 11, 2022  [facility] - Online/Telephonic [address] [place], [place]  [phone]  Please Review Instructions for How to Attend GriefShare Support Group Meetings Online, Emailed on 02/20/2022:  Attending GriefShare Online: Here's What to Expect. Now You Can Be a Part of Your GriefShare Group Online! After You Sign Up for a Group, Your Leader Will Send You More Details About How Your Group Will Meet for Group Discussion (Whether Phone or Video Conference).  You'll Also Receive An Email From Your Leader About How to Use Your Email and Password to Log In to Watch the [employer].   Once You Log In, You'll Have Access to All the Sessions. With This Online Option, You Can Continue to Receive Support From Your Group -- From Anywhere.  Continue to Review Educational Material to [employer], Emailed on 01/12/2022:  ~ Are you Grieving - Support Through Loss and Transition ~ Loneliness and Isolation ~ Relaxation Techniques for Depression ~ Stress  Management - Breathing Exercises for Relaxation   Encouraged to Continue to Review List of [employer] and Resources in [place], and [employer] of Interest, in An Effort to [employer].       Our next appointment is by telephone on 03/06/2022 at 9:00 am.  Please call the care guide team at [phone] if you need to cancel or reschedule your appointment.   If you are experiencing a Mental Health or Behavioral Health Crisis or need someone to talk to, please call the Suicide and Crisis Lifeline: 988 call the [place] National Suicide Prevention Lifeline: 337-484-6379 or TTY: 442-282-1216 TTY 873-259-1837) to talk to a trained counselor call 1-800-273-TALK (toll free, 24 hour hotline) go to [facility]gent [facility] [address] [place] [phone]) call the [facility] Crisis Line: [phone] call 911  Patient verbalizes understanding of instructions and care plan provided today and agrees to view in MyChart. Active MyChart status and patient understanding of how to access instructions and care plan via MyChart confirmed with patient.     Telephone follow up appointment with care management team member scheduled for:  03/06/2022 at 9:00 am.  Danford Bad, BSW, MSW, LCSW  Licensed Clinical Social Worker  [facility]  Mailing [place]. [address], [place], [place] [address] Physical Address-[address] [place], [place] [address] Toll Free Main # [phone] Fax # [phone] Cell # [phone] [email or web address]

## 2022-02-20 NOTE — Patient Outreach (Signed)
  Care Coordination   Follow Up Visit Note   02/20/2022  Name: OLUWADARA GORMAN MRN: [id number] DOB: 02/23/1951  BRIANNE MAINA is a 71 y.o. year old female who sees Margo Aye, Kathleene Hazel, MD for primary care. I spoke with Ricardo Jericho by phone today.  What matters to the patients health and wellness today?  Reduce and Manage Symptoms of Depression and Grief.    Goals Addressed               This Visit's Progress     Reduce and Manage Symptoms of Depression and Grief. (pt-stated)   On track     Care Coordination Interventions:  Active Listening Utilized.  Verbalization of Feelings Encouraged. Emotional Support Provided. Cognitive Behavioral Therapy Initiated. Solution-Focused Strategies Employed. Please Review GriefShare Support Group Meeting Calendar, Emailed on 02/20/2022, and Consider Self-Enrollment in a Support Group of Interest:  [facility] - In Person [address] [place], [place]   Tuesdays at 6:30 pm Jan 17, 2022 - May 16, 2022  [employer] - In Person [address] [place], [place]   Thursdays at 6:30 pm Feb 09, 2022 - May 11, 2022  [facility] - Online/Telephonic [address] [place], [place]  [phone]  Please Review Instructions for How to Attend GriefShare Support Group Meetings Online, Emailed on 02/20/2022:  Attending GriefShare Online: Here's What to Expect. Now You Can Be a Part of Your GriefShare Group Online! After You Sign Up for a Group, Your Leader Will Send You More Details About How Your Group Will Meet for Group Discussion (Whether Phone or Video Conference).  You'll Also Receive An Email From Your Leader About How to Use Your Email and Password to Log In to Watch the [employer].   Once You Log In, You'll Have Access to All the Sessions. With This Online Option, You Can Continue to Receive Support From Your Group -- From Anywhere.  Continue to Review Educational Material to [employer], Emailed on 01/12/2022:  ~ Are you Grieving - Support Through Loss and Transition ~ Loneliness and Isolation ~ Relaxation Techniques for Depression ~ Stress Management - Breathing Exercises for Relaxation   Encouraged to Continue to Review List of [employer] and Resources in [place], and [employer] of Interest, in An Effort to [employer].       SDOH assessments and interventions completed:  Yes.  Care Coordination Interventions Activated:  Yes.   Care Coordination Interventions:  Yes, provided.   Follow up plan: Follow up call scheduled for 03/06/2022 at 9:00 am.  Encounter Outcome:  Pt. Visit Completed.   Danford Bad, BSW, MSW, LCSW  Licensed Restaurant manager, fast food [facility]  Mailing [place] [address]. [address], [place], [place] [address] Physical Address-[address] [place], [place] [address] Toll Free Main # [phone] Fax # [phone] Cell # [phone] [email or web address]

## 2022-02-23 ENCOUNTER — Ambulatory Visit: Payer: Medicare Other | Admitting: "Endocrinology

## 2022-02-27 DIAGNOSIS — E1165 Type 2 diabetes mellitus with hyperglycemia: Secondary | ICD-10-CM | POA: Diagnosis not present

## 2022-02-27 DIAGNOSIS — E785 Hyperlipidemia, unspecified: Secondary | ICD-10-CM | POA: Diagnosis not present

## 2022-02-27 DIAGNOSIS — D649 Anemia, unspecified: Secondary | ICD-10-CM | POA: Diagnosis not present

## 2022-02-28 ENCOUNTER — Other Ambulatory Visit: Payer: Self-pay | Admitting: "Endocrinology

## 2022-03-03 IMAGING — DX DG CHEST 2V
2 series · 2 of 2 positions shown · non-contrast
Comparison: 12/03/2009

CLINICAL DATA: Coughing congestion for several days, intermittent
shortness of breath, former smoker, hypertension, diabetes mellitus

EXAM:
CHEST - 2 VIEW

[chest pa]
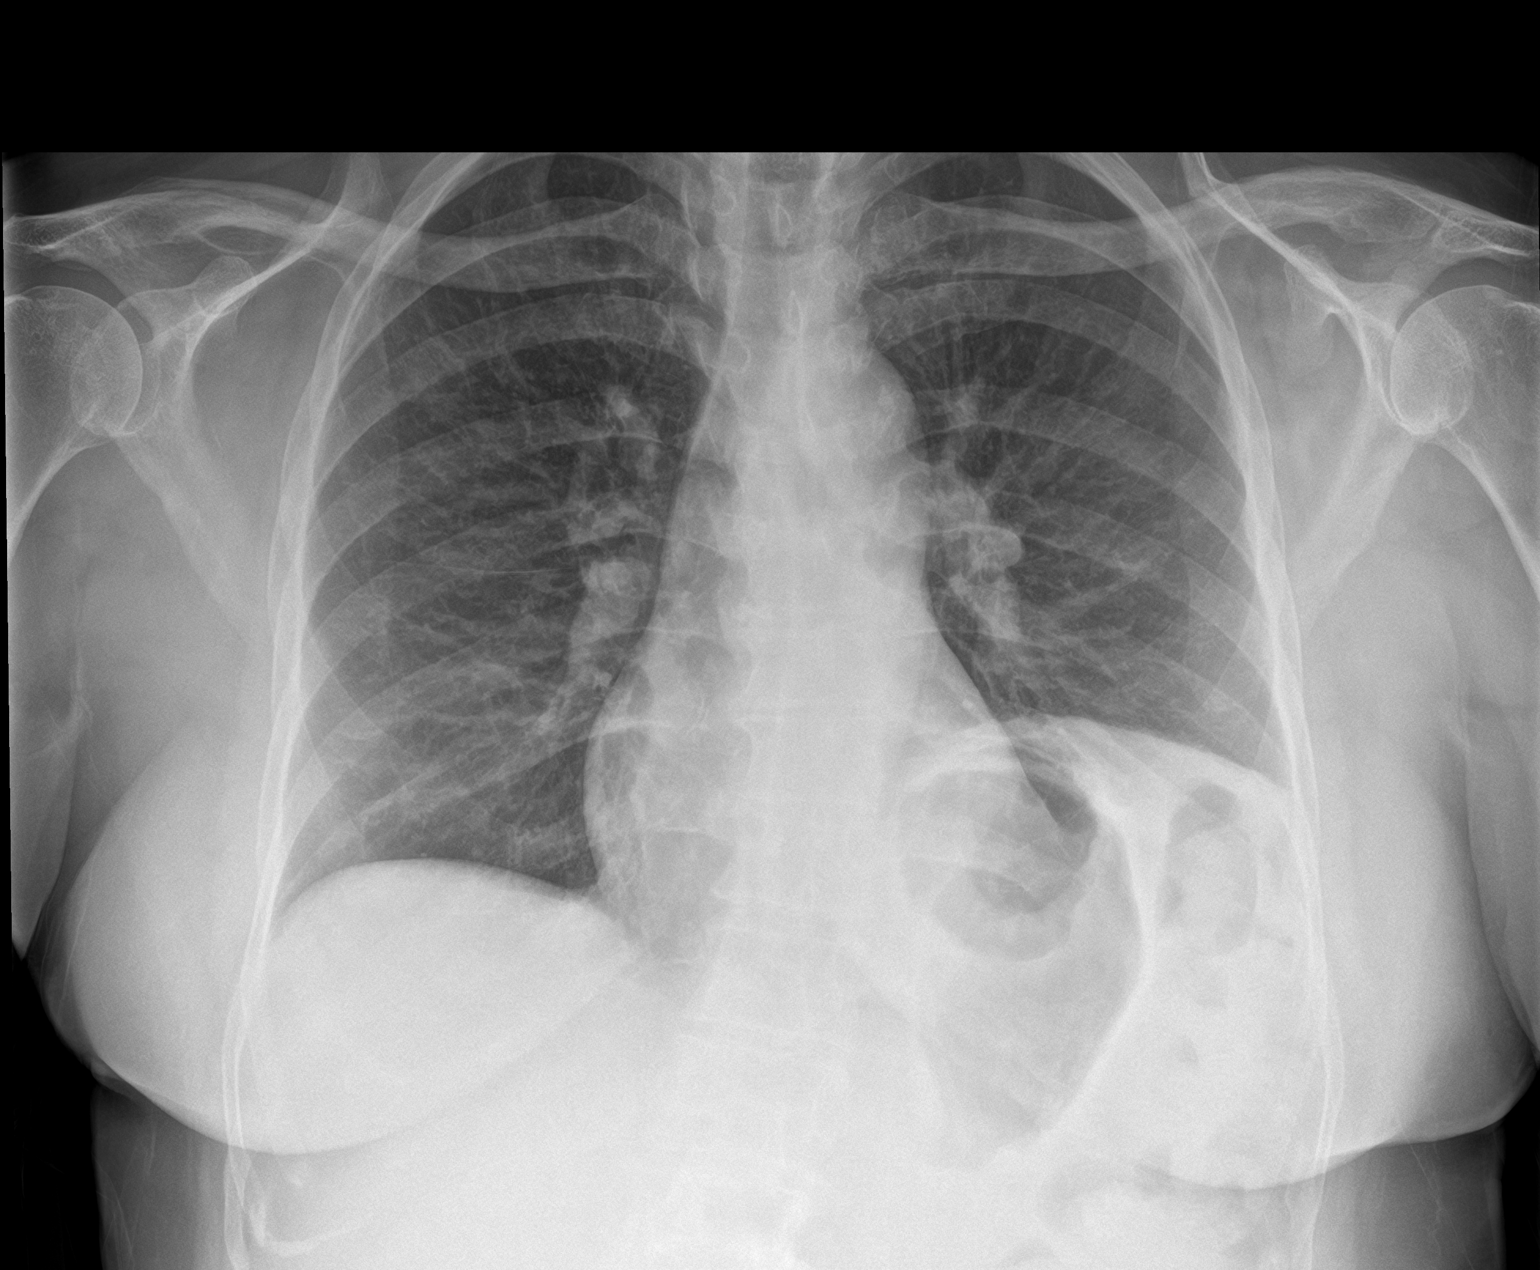

[chest lat]
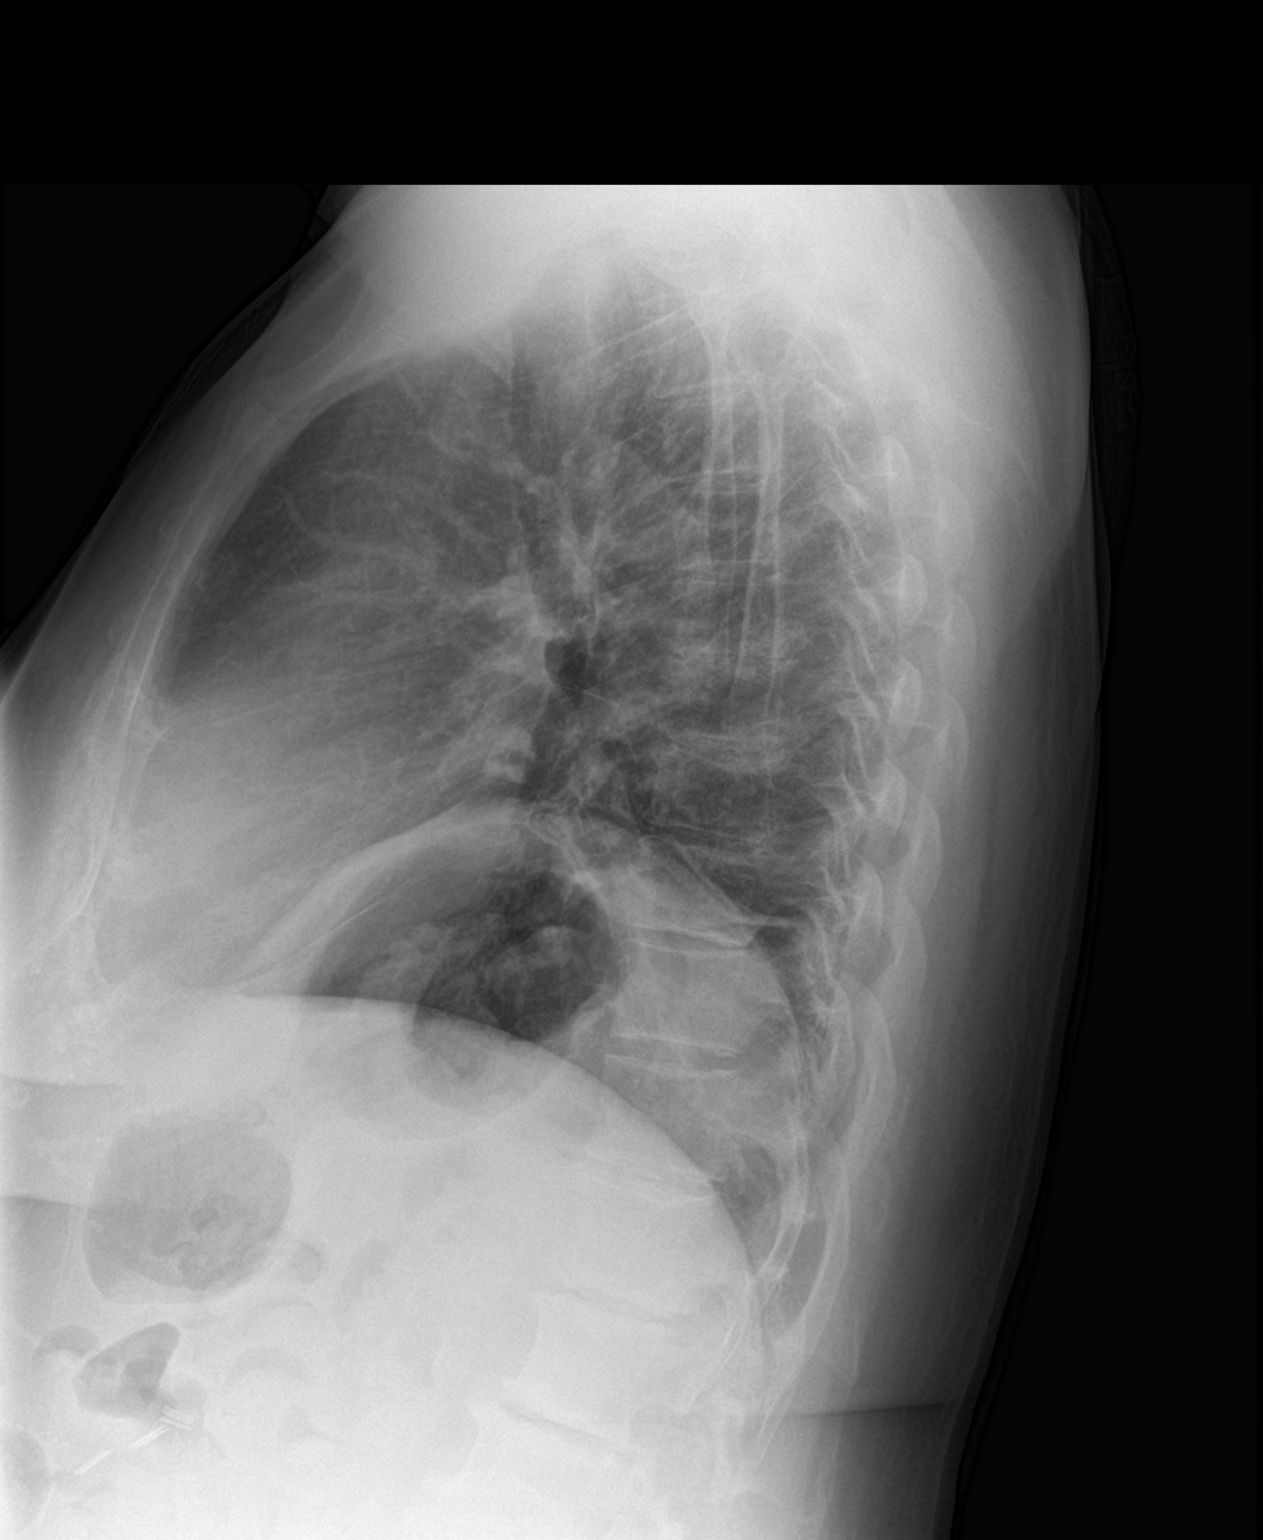

[2 of 2 positions shown; findings below may reference images not displayed]

FINDINGS: Normal heart size, mediastinal contours, and pulmonary vascularity.

Atherosclerotic calcification at aortic arch.

Elevation of LEFT diaphragm with mild LEFT basilar atelectasis.

No pulmonary infiltrate, pleural effusion, or pneumothorax.

Osseous structures unremarkable.
IMPRESSION: Elevation of LEFT diaphragm with mild LEFT basilar atelectasis.

Aortic Atherosclerosis (ARGXG-O74.4).

## 2022-03-06 ENCOUNTER — Other Ambulatory Visit: Payer: Self-pay | Admitting: Adult Health

## 2022-03-06 ENCOUNTER — Ambulatory Visit: Payer: Self-pay | Admitting: *Deleted

## 2022-03-06 ENCOUNTER — Encounter: Payer: Self-pay | Admitting: *Deleted

## 2022-03-06 NOTE — Patient Outreach (Signed)
  Care Coordination   Follow Up Visit Note   03/06/2022  Name: Melissa Tucker MRN: [id number] DOB: Aug 19, 1950  Melissa Tucker is a 71 y.o. year old female who sees Melissa Tucker, Melissa Hazel, MD for primary care. I spoke with Melissa Tucker by phone today.  What matters to the patients health and wellness today?  Reduce and Manage Symptoms of Depression and Grief.    Goals Addressed               This Visit's Progress     Reduce and Manage Symptoms of Depression and Grief. (pt-stated)   On track     Care Coordination Interventions:  Active Listening/Reflection Utilized.  Verbalization of Feelings Encouraged. Emotional Support Provided. Grief Support Initiated. Client-Centered Therapy Performed. Reviewed GriefShare Support Group Meeting Calendar, and Encouraged Self-Enrollment in a Support Group of Interest:  [employer] - In Person [address] [place], [place]   KGYJEHUD at 6:30 pm Jan 17, 2022 - May 16, 2022  [employer] - In Person [address] [place], [place]   Thursdays at 6:30 pm Feb 09, 2022 - May 11, 2022  [facility] - Online/Telephonic [address] [place], [place]  [phone]  Reviewed Instructions for How to Attend GriefShare Support Group Meetings Online, to Ensure Understanding:    Attending GriefShare Online: Here's What to Expect. Now You Can Be a Part of Your GriefShare Group Online! After You Sign Up for a Group, Your Leader Will Send You More Details About How Your Group Will Meet for Group Discussion (Whether Phone or Video Conference).  You'll Also Receive An Email From Your Leader About How to Use Your Email and Password to Log In to Watch the [employer].   Once You Log In, You'll Have Access to All the Sessions. With This Online Option, You Can Continue to Receive Support From Your Group -- From Anywhere.        SDOH assessments and interventions completed:  Yes.   Care Coordination  Interventions Activated:  Yes.    Care Coordination Interventions:  Yes, Provided.    Follow up plan: Follow Up Call Scheduled on 03/20/2022 at 9:00 am.   Encounter Outcome:  Pt. Visit Completed.    Melissa Tucker, BSW, MSW, LCSW  Licensed Restaurant manager, fast food [facility] System  Mailing [place] [address]. [address] [place], [place] [address] Physical Address-[address] [place], [place] [address] Toll Free Main # [phone] Fax # [phone] Cell # [phone] [email or web address]

## 2022-03-06 NOTE — Patient Instructions (Signed)
Visit Information  Thank you for taking time to visit with me today. Please don't hesitate to contact me if I can be of assistance to you.   Following are the goals we discussed today:   Goals Addressed               This Visit's Progress     Reduce and Manage Symptoms of Depression and Grief. (pt-stated)   On track     Care Coordination Interventions:  Active Listening/Reflection Utilized.  Verbalization of Feelings Encouraged. Emotional Support Provided. Grief Support Initiated. Client-Centered Therapy Performed. Reviewed GriefShare Support Group Meeting Calendar, and Encouraged Self-Enrollment in a Support Group of Interest:  [employer] - In Person [address] [place], [place]   RUEAVWUJ at 6:30 pm Jan 17, 2022 - May 16, 2022  [employer] - In Person [address] [place], [place]   Thursdays at 6:30 pm Feb 09, 2022 - May 11, 2022  [facility] - Online/Telephonic [address] [place], [place]  [phone]  Reviewed Instructions for How to Attend GriefShare Support Group Meetings Online, to Ensure Understanding:    Attending GriefShare Online: Here's What to Expect. Now You Can Be a Part of Your GriefShare Group Online! After You Sign Up for a Group, Your Leader Will Send You More Details About How Your Group Will Meet for Group Discussion (Whether Phone or Video Conference).  You'll Also Receive An Email From Your Leader About How to Use Your Email and Password to Log In to Watch the [employer].   Once You Log In, You'll Have Access to All the Sessions. With This Online Option, You Can Continue to Receive Support From Your Group -- From Anywhere.        Our next appointment is by telephone on 03/20/2022 at 9:00 am.  Please call the care guide team at [phone] if you need to cancel or reschedule your appointment.   If you are experiencing a Mental Health or Behavioral Health Crisis or need  someone to talk to, please call the Suicide and Crisis Lifeline: 988 call the [place] National Suicide Prevention Lifeline: 2230470168 or TTY: (680) 353-5663 TTY 701 063 4828) to talk to a trained counselor call 1-800-273-TALK (toll free, 24 hour hotline) go to [facility] Urgent Care [address] [place] [phone]) call the [facility] Crisis Line: [phone] call 911  Patient verbalizes understanding of instructions and care plan provided today and agrees to view in MyChart. Active MyChart status and patient understanding of how to access instructions and care plan via MyChart confirmed with patient.     Telephone follow up appointment with care management team member scheduled for:  03/20/2022 at 9:00 am.  Danford Bad, BSW, MSW, LCSW  Licensed Clinical Social Worker  [facility]  Mailing [place]. [address], [place], [place] [address] Physical Address-[address], [place], [place] [address] Toll Free Main # [phone] Fax # [phone] Cell # [phone] [email or web address]

## 2022-03-07 DIAGNOSIS — R112 Nausea with vomiting, unspecified: Secondary | ICD-10-CM | POA: Diagnosis not present

## 2022-03-07 DIAGNOSIS — I700 Atherosclerosis of aorta: Secondary | ICD-10-CM | POA: Diagnosis not present

## 2022-03-07 DIAGNOSIS — G4700 Insomnia, unspecified: Secondary | ICD-10-CM | POA: Diagnosis not present

## 2022-03-07 DIAGNOSIS — D649 Anemia, unspecified: Secondary | ICD-10-CM | POA: Diagnosis not present

## 2022-03-07 DIAGNOSIS — G894 Chronic pain syndrome: Secondary | ICD-10-CM | POA: Diagnosis not present

## 2022-03-07 DIAGNOSIS — E1165 Type 2 diabetes mellitus with hyperglycemia: Secondary | ICD-10-CM | POA: Diagnosis not present

## 2022-03-07 DIAGNOSIS — R809 Proteinuria, unspecified: Secondary | ICD-10-CM | POA: Diagnosis not present

## 2022-03-07 DIAGNOSIS — E785 Hyperlipidemia, unspecified: Secondary | ICD-10-CM | POA: Diagnosis not present

## 2022-03-07 DIAGNOSIS — I10 Essential (primary) hypertension: Secondary | ICD-10-CM | POA: Diagnosis not present

## 2022-03-07 DIAGNOSIS — Z13820 Encounter for screening for osteoporosis: Secondary | ICD-10-CM | POA: Diagnosis not present

## 2022-03-20 ENCOUNTER — Ambulatory Visit: Payer: Self-pay | Admitting: *Deleted

## 2022-03-20 ENCOUNTER — Encounter: Payer: Self-pay | Admitting: *Deleted

## 2022-03-20 NOTE — Patient Instructions (Signed)
Visit Information  Thank you for taking time to visit with me today. Please don't hesitate to contact me if I can be of assistance to you.   Following are the goals we discussed today:   Goals Addressed               This Visit's Progress     Reduce and Manage Symptoms of Depression and Grief. (pt-stated)   On track     Care Coordination Interventions:  Active Listening/Reflection Utilized.  Verbalization of Feelings Encouraged. Grief and Emotional Support Provided. Cognitive Behavioral Therapy Initiated. Client-Centered Therapy Performed. Encouraged Self-Enrollment in a [place] of Interest, From the List Provided:  [place], [place] at 6:30 pm Jan 17, 2022 - May 16, 2022  [employer] - In Person [address] [place], [place]   Thursdays at 6:30 pm Feb 09, 2022 - May 11, 2022  [place], [place]  [place] with Primary Care Provider, Dr. Allyn Kenner to Inquire About New Psychotropic Medication Regimen.   ~ Provided with 1 Month (30 Day) Supply of Vraylar (Cariprazine) 20 MG Samples on 02/27/2022. ~ Instructed to Begin Taking Vraylar (Cariprazine) 20 MG, PO, Daily on 02/27/2022, for 30 Days. ~ Started on Last Week (7 Day) Supply of Vraylar (Cariprazine) 20 MG Samples on 03/20/2022.  ~ Received Letter from [employer], [employer], Denying Coverage for Prescription Vraylar (Cariprazine). ~ Will Primary Care Provider, Dr. Allyn Kenner Need to [employer] Provider, [employer], to Override Coverage Denial for Prescription Vraylar (Cariprazine)? ~ Will Additional Samples of Vraylar (Cariprazine) Be Offered by Primary Care Provider, Dr. Allyn Kenner If [employer], [employer], Continues to [employer], or Will a New Psychotropic Medication for  Depression Be Prescribed? ~ Abruptly Stopped Taking Lexapro (Escitalopram) 20 MG, PO, Daily and Prozac (Fluoxetine) 20 MG, PO, Daily, on 02/27/2022, But Unable to Recall If Instructed To Do So? ~ Reported Lexapro (Escitalopram) 20 MG, PO, Daily and Prozac (Fluoxetine) 20 MG, PO, Daily to Be Ineffective at Reducing Symptoms of Depression. ~ Responding Well to Vraylar (Cariprazine) 20 MG, PO, Daily, and Would Like to [employer], If [employer], [employer], Approved.         Our next appointment is by telephone on 03/30/2022 at 11:00 am.  Please call the care guide team at [phone] if you need to cancel or reschedule your appointment.   If you are experiencing a Mental Health or [place] or need someone to talk to, please call the Suicide and Crisis Lifeline: 988 call the [place] National Suicide Prevention Lifeline: 571-885-6602 or TTY: 604-180-1663 TTY 701-873-5038) to talk to a trained counselor call 1-800-273-TALK (toll free, 24 hour hotline) go to [facility] Urgent Care [address] [place] [phone]) call the [place]: [phone] call 911  Patient verbalizes understanding of instructions and care plan provided today and agrees to view in [place]. Active MyChart status and patient understanding of how to access instructions and care plan via MyChart confirmed with patient.     Telephone follow up appointment with care management team member scheduled for:  03/30/2022 at 11:00 am.  Nat Christen, BSW, MSW, [place]  Licensed Clinical Social Worker  [place]  Mailing [place]. [address] [place], [place] [address] Physical Address-[address] [place],  [place], [place] [address] Toll Free Main # [phone] Fax # [phone] Cell # [phone] [email or web address]

## 2022-03-20 NOTE — Patient Outreach (Signed)
  Care Coordination   Follow Up Visit Note   03/20/2022  Name: Melissa Tucker MRN: [id number] DOB: 05-23-1950  Melissa Tucker is a 71 y.o. year old female who sees Margo Aye, Kathleene Hazel, MD for primary care. I spoke with Ricardo Jericho by phone today.  What matters to the patients health and wellness today?   Reduce and Manage Symptoms of Depression and Grief.   Goals Addressed               This Visit's Progress     Reduce and Manage Symptoms of Depression and Grief. (pt-stated)   On track     Care Coordination Interventions:  Active Listening/Reflection Utilized.  Verbalization of Feelings Encouraged. Grief and Emotional Support Provided. Cognitive Behavioral Therapy Initiated. Client-Centered Therapy Performed. Encouraged Self-Enrollment in a GriefShare Support Group of Interest, From the List Provided:  [employer] - In Person [address] [place], [place]   WNIOEVOJ at 6:30 pm Jan 17, 2022 - May 16, 2022  [employer] - In Person [address] [place], [place]   Thursdays at 6:30 pm Feb 09, 2022 - May 11, 2022  [facility] - Online/Telephonic [address] [place], [place]  [phone]  CSW Collaboration with Primary Care Provider, Dr. Nita Sells to Inquire About New Psychotropic Medication Regimen.   ~ Provided with 1 Month (30 Day) Supply of Vraylar (Cariprazine) 20 MG Samples on 02/27/2022. ~ Instructed to Begin Taking Vraylar (Cariprazine) 20 MG, PO, Daily on 02/27/2022, for 30 Days. ~ Started on Last Week (7 Day) Supply of Vraylar (Cariprazine) 20 MG Samples on 03/20/2022.  ~ Received Letter from [employer], [employer], Denying Coverage for Prescription Vraylar (Cariprazine). ~ Will Primary Care Provider, Dr. Nita Sells Need to [employer] Provider, [employer], to Override Coverage Denial for Prescription Vraylar (Cariprazine)? ~ Will Additional Samples of Vraylar (Cariprazine) Be  Offered by Primary Care Provider, Dr. Nita Sells If [employer], [employer], Continues to [employer], or Will a New Psychotropic Medication for Depression Be Prescribed? ~ Abruptly Stopped Taking Lexapro (Escitalopram) 20 MG, PO, Daily and Prozac (Fluoxetine) 20 MG, PO, Daily, on 02/27/2022, But Unable to Recall If Instructed To Do So? ~ Reported Lexapro (Escitalopram) 20 MG, PO, Daily and Prozac (Fluoxetine) 20 MG, PO, Daily to Be Ineffective at Reducing Symptoms of Depression. ~ Responding Well to Vraylar (Cariprazine) 20 MG, PO, Daily, and Would Like to [employer], If [employer], [employer], Approved.         SDOH assessments and interventions completed:  Yes.  Care Coordination Interventions:  Yes, provided.   Follow up plan: Follow up call scheduled for 03/30/2022 at 11:00 am.  Encounter Outcome:  Pt. Visit Completed.   Danford Bad, BSW, MSW, LCSW  Licensed Restaurant manager, fast food [facility] System  Mailing [place] [address]. [address], [place], [place] [address] Physical Address-[address], [place], [place] [address] Toll Free Main # [phone] Fax # [phone] Cell # [phone] [email or web address]

## 2022-03-22 ENCOUNTER — Ambulatory Visit: Payer: Medicare Other | Admitting: "Endocrinology

## 2022-03-30 ENCOUNTER — Ambulatory Visit: Payer: Self-pay | Admitting: *Deleted

## 2022-03-30 ENCOUNTER — Encounter: Payer: Self-pay | Admitting: *Deleted

## 2022-03-30 NOTE — Patient Instructions (Signed)
Visit Information  Thank you for taking time to visit with me today. Please don't hesitate to contact me if I can be of assistance to you.   Following are the goals we discussed today:   Goals Addressed               This Visit's Progress     Reduce and Manage Symptoms of Depression and Grief. (pt-stated)   On track     Care Coordination Interventions:  Active Listening/Reflection Utilized.  Task-Centered Strategies Implemented. Problem-Solving Interventions Employed. Verbalization of Feelings Emphasized. Emotional Support Provided. Cognitive Behavioral Therapy Initiated. Client-Centered Therapy Performed. Grief & Loss Support Groups Reviewed & Self-Enrollment Encouraged. CSW Collaboration with Primary Care Provider, Dr. Nita Sells to Report New Psychotropic Medication, Vraylar (Cariprazine) 20 MG, PO, Daily, Working Effectively, Per Patient's Request.  Please Review "How to Cope With Depression and Grief During the Holiday's": ~ Set Boundaries with Holiday Events - You Can Participate & Not Participate In Whatever Feels Right For You. ~ Tune Into Your Grief Emotions - It Is Important To Acknowledge Your Feelings & Not Avoid Them. ~ Plan Ahead to [facility] Empty Holiday Roles - Planning Ahead Can Avoid Unnecessary Moments Of Grief & Can Help Make The Experiences More Fluid & Enjoyable. ~ Honor Old Traditions & Honor Memories - Continue With Old Traditions That Existed In Order To [employer] & Celebrate Your Son, To Help Keep His Memory Present. ~ Create New Traditions - Making New Memories Does Not Erase Old Memories.  ~ Identify Grief Coping Skills - Consider Creating A List of Go-To Coping Skills To Use Whether You Are At Home Or At A Social Function.  ~ Volunteer/Do Something Charitable - Helping Others Helps Alleviate Your Sadness, While Bringing Joy Into Someone Else's Life Who Needs It.  ~ Ask For Help When Struggling With Grief - It Is Important to Seek Support From Friends, Family,  [employer], & CSW, If Needed.         Our next appointment is by telephone on 04/13/2022 at 11:45 am.  Please call the care guide team at [phone] if you need to cancel or reschedule your appointment.   If you are experiencing a Mental Health or Behavioral Health Crisis or need someone to talk to, please call the Suicide and Crisis Lifeline: 988 call the [place] National Suicide Prevention Lifeline: (606)849-6433 or TTY: (276)785-8431 TTY 435 689 6114) to talk to a trained counselor call 1-800-273-TALK (toll free, 24 hour hotline) go to [facility] Urgent Care [address] [place] [phone]) call the [facility] Crisis Line: [phone] call 911  Patient verbalizes understanding of instructions and care plan provided today and agrees to view in MyChart. Active MyChart status and patient understanding of how to access instructions and care plan via MyChart confirmed with patient.     Telephone follow up appointment with care management team member scheduled for:  04/13/2022 at 11:45 am.  Danford Bad, BSW, MSW, LCSW  Licensed Clinical Social Worker  [facility]  Mailing [place]. [address] [place], [place] [address] Physical Address-[address], [place], [place] [address] Toll Free Main # [phone] Fax # [phone] Cell # [phone] [email or web address]

## 2022-03-30 NOTE — Patient Outreach (Signed)
  Care Coordination   Follow Up Visit Note   03/30/2022  Name: Melissa Tucker MRN: [id number] DOB: [birth year]  Melissa Tucker is a 71 y.o. year old female who sees Margo Aye, Kathleene Hazel, MD for primary care. I spoke with Ricardo Jericho by phone today.  What matters to the patients health and wellness today?  Reduce and Manage Symptoms of Depression and Grief.    Goals Addressed               This Visit's Progress     Reduce and Manage Symptoms of Depression and Grief. (pt-stated)   On track     Care Coordination Interventions:  Active Listening/Reflection Utilized.  Task-Centered Strategies Implemented. Problem-Solving Interventions Employed. Verbalization of Feelings Emphasized. Emotional Support Provided. Cognitive Behavioral Therapy Initiated. Client-Centered Therapy Performed. Grief & Loss Support Groups Reviewed & Self-Enrollment Encouraged. CSW Collaboration with Primary Care Provider, Dr. Nita Sells to Report New Psychotropic Medication, Vraylar (Cariprazine) 20 MG, PO, Daily, Working Effectively, Per Patient's Request.  Please Review "How to Cope With Depression and Grief During the Holiday's": ~ Set Boundaries with Holiday Events - You Can Participate & Not Participate In Whatever Feels Right For You. ~ Tune Into Your Grief Emotions - It Is Important To Acknowledge Your Feelings & Not Avoid Them. ~ Plan Ahead to [facility] Empty Holiday Roles - Planning Ahead Can Avoid Unnecessary Moments Of Grief & Can Help Make The Experiences More Fluid & Enjoyable. ~ Honor Old Traditions & Honor Memories - Continue With Old Traditions That Existed In Order To [employer] & Celebrate Your Son, To Help Keep His Memory Present. ~ Create New Traditions - Making New Memories Does Not Erase Old Memories.  ~ Identify Grief Coping Skills - Consider Creating A List of Go-To Coping Skills To Use Whether You Are At Home Or At A Social Function.  ~ Volunteer/Do Something Charitable - Helping Others Helps Alleviate Your  Sadness, While Bringing Joy Into Someone Else's Life Who Needs It.  ~ Ask For Help When Struggling With Grief - It Is Important to Seek Support From Friends, Family, [employer], & CSW, If Needed.         SDOH assessments and interventions completed:  Yes.  Care Coordination Interventions:  Yes, provided.   Follow up plan: Follow up call scheduled for 04/13/2022 at 11:45 am.  Encounter Outcome:  Pt. Visit Completed.   Danford Bad, BSW, MSW, LCSW  Licensed Restaurant manager, fast food Health System  Mailing [place] [address] [place], [place] [address] Physical Address-[address], [place], [place] [address] Toll Free Main # [phone] Fax # [phone] Cell # [phone] [email or web address]

## 2022-03-31 ENCOUNTER — Other Ambulatory Visit: Payer: Self-pay | Admitting: Adult Health

## 2022-04-01 ENCOUNTER — Other Ambulatory Visit: Payer: Self-pay | Admitting: Adult Health

## 2022-04-13 ENCOUNTER — Ambulatory Visit: Payer: Self-pay | Admitting: *Deleted

## 2022-04-13 NOTE — Patient Outreach (Signed)
  Care Coordination   04/13/2022  Name: Melissa Tucker MRN: [id number] DOB: 06-Mar-1951   Care Coordination Outreach Attempts:  An unsuccessful telephone outreach was attempted today to offer the patient information about available care coordination services as a benefit of their health plan. HIPAA compliant messages left on voicemail, providing contact information for CSW, encouraging patient to return CSW's call at her earliest convenience.   Follow Up Plan:  Additional outreach attempts will be made to offer the patient care coordination information and services.   Encounter Outcome:  No Answer.   Care Coordination Interventions:  No, not indicated.    Nat Christen, BSW, MSW, LCSW  Licensed Education officer, environmental Health System  Mailing [place] [address] [place], [place] [address] Physical Address-[address] [place],  [address] Toll Free Main # [phone] Fax # [phone] Cell # [phone] [name]

## 2022-04-14 ENCOUNTER — Telehealth: Payer: Self-pay | Admitting: *Deleted

## 2022-04-14 NOTE — Progress Notes (Unsigned)
  Care Coordination Note  04/14/2022 Name: LORRIN BODNER MRN: [id number] DOB: 1950-08-06  MARIANE BURPEE is a 72 y.o. year old female who is a primary care patient of [place] Crane, Edwinna Areola, MD and is actively engaged with the care management team. I reached out to Ulyses Jarred by phone today to assist with re-scheduling a follow up visit with the Licensed Clinical Social Worker [place].  Follow up plan: Unsuccessful telephone outreach attempt made. A HIPAA compliant phone message was left for the patient providing contact information and requesting a return call.   [place]  Direct Dial: [phone]

## 2022-04-18 NOTE — Progress Notes (Signed)
  Care Coordination Note  04/18/2022 Name: SHERILYNN DIEU MRN: [id number] DOB: [birth year]  ROBBY PIRANI is a 72 y.o. year old female who is a primary care patient of [place] Crane, Edwinna Areola, MD and is actively engaged with the care management team. I reached out to Ulyses Jarred by phone today to assist with scheduling a follow up visit with the Licensed Clinical Social Worker  Follow up plan: Telephone appointment with care management team member scheduled for:05/02/22  [place]: [phone]

## 2022-04-27 ENCOUNTER — Other Ambulatory Visit: Payer: Self-pay | Admitting: "Endocrinology

## 2022-04-28 ENCOUNTER — Other Ambulatory Visit: Payer: Self-pay | Admitting: "Endocrinology

## 2022-04-29 ENCOUNTER — Other Ambulatory Visit: Payer: Self-pay | Admitting: "Endocrinology

## 2022-05-02 ENCOUNTER — Telehealth: Payer: Self-pay | Admitting: "Endocrinology

## 2022-05-02 ENCOUNTER — Encounter: Payer: Self-pay | Admitting: *Deleted

## 2022-05-02 ENCOUNTER — Ambulatory Visit: Payer: Self-pay | Admitting: *Deleted

## 2022-05-02 DIAGNOSIS — E1165 Type 2 diabetes mellitus with hyperglycemia: Secondary | ICD-10-CM

## 2022-05-02 MED ORDER — METFORMIN HCL 1000 MG PO TABS: 1000.0000 mg | ORAL_TABLET | Freq: Two times a day (BID) | ORAL | 0 refills | Status: DC

## 2022-05-02 MED ORDER — TRULICITY 3 MG/0.5ML ~~LOC~~ SOAJ: SUBCUTANEOUS | 0 refills | Status: DC

## 2022-05-02 NOTE — Telephone Encounter (Signed)
Rx refill sent for metformin and trulicity with quantity enough to get pt to her upcoming appointment.

## 2022-05-02 NOTE — Patient Outreach (Signed)
  Care Coordination   Follow Up Visit Note   05/02/2022  Name: Melissa Tucker MRN: [id number] DOB: 1950/11/12  Melissa Tucker is a 72 y.o. year old female who sees Nevada Crane, Edwinna Areola, MD for primary care. I spoke with Ulyses Jarred by phone today.  What matters to the patients health and wellness today?  Reduce and Manage Symptoms of Depression and Grief.   Goals Addressed               This Visit's Progress     Reduce and Manage Symptoms of Depression and Grief. (pt-stated)   On track     Care Coordination Interventions:  Active Listening & Reflection Utilized.  Verbalization of Feelings Encouraged. Emotional Support Provided. Task-Centered Strategies Activated. Problem-Solving Interventions Implemented. Solution-Focused Activities Employed. Cognitive Behavioral Therapy Initiated. Acceptance & Commitment Therapy Introduced. Grief & Loss Support Groups Reviewed & Self-Enrollment Emphasized. Please Continue to Administer Psychotropic Medication, Vraylar (Cariprazine) 20 MG, PO, Daily, Exactly As Prescribed.  Confirmed Receipt & Thoroughly Reviewed, "How to Cope With Depression & Grief During the Holiday's", to Ensure Understanding: ~ Set Boundaries with Holiday Events - You Can Participate & Not Participate In Whatever Feels Right For You. ~ Tune Into Your Grief Emotions - It Is Important To Acknowledge Your Feelings & Not Avoid Them. ~ Plan Ahead to [facility] Empty Holiday Roles - Planning Ahead Can Avoid Unnecessary Moments Of Grief & Can Help Make The Experiences More Fluid & Enjoyable. ~ [place] Memories - Continue With Old Traditions That Existed In Order To [place] Your Son, To Help Keep His Memory Present. ~ [place] Memories Does Not Erase Old Memories.  ~ Identify Grief Coping Skills - Consider Creating A List of Go-To Coping Skills To Use Whether You Are At Home Or At A Social Function.  ~ Volunteer/Do Something Charitable -  Helping Others Helps Alleviate Your Sadness, While Bringing Joy Into Someone Else's Life Who Needs It.  ~ Ask For Help When Struggling With Grief - It Is Important to Seek Support From Friends, Family, [employer], & CSW, If Needed.         SDOH assessments and interventions completed:  Yes.  Care Coordination Interventions:  Yes, provided.   Follow up plan: Follow up call scheduled for 05/19/2022 at 12:15 pm.  Encounter Outcome:  Pt. Visit Completed.   Nat Christen, BSW, MSW, LCSW  Licensed Education officer, environmental Health System  Mailing [place] [address] [place], [place] [address] Physical Address-[address], [place], [place] [address] Toll Free Main # [phone] Fax # [phone] Cell # [phone] [email or web address]

## 2022-05-02 NOTE — Telephone Encounter (Signed)
Pts asking for a refill to get her to her appt on 05/31/22 of her Metformin and her trulicity, Melissa Tucker is the Pharmacy.

## 2022-05-02 NOTE — Patient Instructions (Signed)
Visit Information  Thank you for taking time to visit with me today. Please don't hesitate to contact me if I can be of assistance to you.   Following are the goals we discussed today:   Goals Addressed               This Visit's Progress     Reduce and Manage Symptoms of Depression and Grief. (pt-stated)   On track     Care Coordination Interventions:  Active Listening & Reflection Utilized.  Verbalization of Feelings Encouraged. Emotional Support Provided. Task-Centered Strategies Activated. Problem-Solving Interventions Implemented. Solution-Focused Activities Employed. Cognitive Behavioral Therapy Initiated. Acceptance & Commitment Therapy Introduced. Grief & Loss Support Groups Reviewed & Self-Enrollment Emphasized. Please Continue to Administer Psychotropic Medication, Vraylar (Cariprazine) 20 MG, PO, Daily, Exactly As Prescribed.  Confirmed Receipt & Thoroughly Reviewed, "How to Cope With Depression & Grief During the Holiday's", to Ensure Understanding: ~ Set Boundaries with Holiday Events - You Can Participate & Not Participate In Whatever Feels Right For You. ~ Tune Into Your Grief Emotions - It Is Important To Acknowledge Your Feelings & Not Avoid Them. ~ Plan Ahead to [facility] Empty Holiday Roles - Planning Ahead Can Avoid Unnecessary Moments Of Grief & Can Help Make The Experiences More Fluid & Enjoyable. ~ [place] Memories - Continue With Old Traditions That Existed In Order To [place] Your Son, To Help Keep His Memory Present. ~ [place] Memories Does Not Erase Old Memories.  ~ Identify Grief Coping Skills - Consider Creating A List of Go-To Coping Skills To Use Whether You Are At Home Or At A Social Function.  ~ Volunteer/Do Something Charitable - Helping Others Helps Alleviate Your Sadness, While Bringing Joy Into Someone Else's Life Who Needs It.  ~ Ask For Help When Struggling With Grief - It Is Important to  Seek Support From Friends, Family, [employer], & CSW, If Needed.         Our next appointment is by telephone on 05/19/2022 at 12:15 pm.  Please call the care guide team at [phone] if you need to cancel or reschedule your appointment.   If you are experiencing a Mental Health or [place] or need someone to talk to, please call the Suicide and Crisis Lifeline: 988 call the [place] National Suicide Prevention Lifeline: (380)590-2188 or TTY: 925-631-6119 TTY 952 635 6193) to talk to a trained counselor call 1-800-273-TALK (toll free, 24 hour hotline) go to [facility] [address], [place] [phone]) call the [place]: 405-215-5264 call 911  Patient verbalizes understanding of instructions and care plan provided today and agrees to view in [place]. Active MyChart status and patient understanding of how to access instructions and care plan via MyChart confirmed with patient.     Telephone follow up appointment with care management team member scheduled for:  05/19/2022 at 12:15 pm.  Nat Christen, BSW, MSW, [place]  Licensed Clinical Social Worker  [place]  Mailing [place]. [address], [place], [place] [address] Physical Address-[address] [place], [place] [address] Toll Free Main # [phone] Fax # [phone] Cell # [phone] [email or web address]

## 2022-05-03 ENCOUNTER — Other Ambulatory Visit: Payer: Self-pay | Admitting: Adult Health

## 2022-05-08 ENCOUNTER — Other Ambulatory Visit: Payer: Self-pay | Admitting: "Endocrinology

## 2022-05-08 DIAGNOSIS — E1165 Type 2 diabetes mellitus with hyperglycemia: Secondary | ICD-10-CM

## 2022-05-09 DIAGNOSIS — S72401D Unspecified fracture of lower end of right femur, subsequent encounter for closed fracture with routine healing: Secondary | ICD-10-CM | POA: Diagnosis not present

## 2022-05-09 DIAGNOSIS — S72402D Unspecified fracture of lower end of left femur, subsequent encounter for closed fracture with routine healing: Secondary | ICD-10-CM | POA: Diagnosis not present

## 2022-05-09 DIAGNOSIS — S82851D Displaced trimalleolar fracture of right lower leg, subsequent encounter for closed fracture with routine healing: Secondary | ICD-10-CM | POA: Diagnosis not present

## 2022-05-15 ENCOUNTER — Other Ambulatory Visit: Payer: Self-pay | Admitting: Adult Health

## 2022-05-19 ENCOUNTER — Ambulatory Visit: Payer: Self-pay | Admitting: *Deleted

## 2022-05-19 ENCOUNTER — Encounter: Payer: Self-pay | Admitting: *Deleted

## 2022-05-22 NOTE — Patient Outreach (Signed)
Care Coordination   Follow Up Visit Note   05/19/2022 - Late Entry  Name: Melissa Tucker MRN: [id number] DOB: [birth year]  Melissa Tucker is a 72 y.o. year old female who sees [place] Crane, Edwinna Areola, MD for primary care. I spoke with Ulyses Jarred by phone today.  What matters to the patients health and wellness today?   Reduce and Manage Symptoms of Depression and Grief.   Goals Addressed               This Visit's Progress     Reduce and Manage Symptoms of Depression and Grief. (pt-stated)   On track     Care Coordination Interventions:  Interventions Today    Flowsheet Row Most Recent Value  Chronic Disease Discussed/Reviewed   Chronic disease discussed/reviewed during today's visit Other  [Anxiety and Depression]  General Interventions   General Interventions Discussed/Reviewed General Interventions Discussed, General Interventions Reviewed, Community Resources  [Reviewed Medications & Consult with Physician Psychotropic Medication Regimen]  Exercise Interventions   Exercise Discussed/Reviewed Exercise Discussed, Exercise Reviewed, Physical Activity  Physical Activity Discussed/Reviewed Physical Activity Discussed, Physical Activity Reviewed, Types of exercise, Home Exercise Program (HEP)  [Performs Chair & Leg Exercises,  Begin Outpatient Physical Therapy on 05/26/2022]  Education Interventions   Education Provided Provided Printed Education  Mental Health Interventions   Mental Health Discussed/Reviewed Mental Health Discussed, Mental Health Reviewed, Coping Strategies  Nutrition Interventions   Nutrition Discussed/Reviewed Nutrition Discussed, Nutrition Reviewed  Pharmacy Interventions   Pharmacy Dicussed/Reviewed Pharmacy Topics Discussed, Pharmacy Topics Reviewed, Medications and their functions, Medication Adherence, Affording Medications  Medication Adherence Not taking medication  [Not Taking Vraylar as Prescribed - Taking Every Other Day]  Safety Interventions   Safety  Discussed/Reviewed Safety Discussed, Safety Reviewed, Fall Risk, Home Safety  Home Safety Assistive Devices  Advanced Directive Interventions   Advanced Directives Discussed/Reviewed Advanced Directives Discussed     Active Listening & Reflection Utilized.  Verbalization of Feelings Encouraged. Emotional Support Provided. Feelings of Grief & Loss Validated. Cognitive Behavioral Therapy Initiated. Acceptance & Commitment Therapy Performed. Client-Centered Therapy Indicated. Task-Centered Strategies Enacted. Problem-Solving Interventions Implemented. Solution-Focused Activities Employed. Grief & Loss Support Groups Reviewed. Self-Enrollment in Grief & Loss Support Group of Interest Emphasized, from List Provided. Continue to [place] through [employer], Friends, Recruitment consultant Members, Neighbors, [place]. Attend Initial Outpatient Physical & Occupational Therapy Services Appointment at [facility] ([address], [place], [place] [address]; # [phone]), Scheduled on 05/26/2022 at 1:00 PM. Attend Outpatient Physical & Occupational Therapy Services at [facility] ([address] [place], [place] [address]; # [phone]), Two Times Per Week, for 6 - 8 Weeks. CSW Collaboration with Primary Care Provider, Dr. Allyn Kenner (# [phone]), to Report Not Taking Psychotropic Medication (Vraylar) As Prescribed. ~ "It Really Helps & I Don't Want to Switch Medications, But It Makes Me Very Sleepy, So I Am Only Taking Vraylar Every Other Day". Sleep Hygiene Assessed & Techniques Promoted.          ~ "I Average About 10 Hours of Sleep Per Night."          ~ "I typically Go To Sleep Around 11:00 PM & Sleep Soundly Until 9:00 AM".          SDOH assessments and interventions completed:  Yes.  Care Coordination Interventions:  Yes, provided.   Follow up plan: Follow up call scheduled for 06/01/2022 at 1:30 pm.  Encounter  Outcome:  Pt. Visit Completed.   Melissa Tucker,  BSW, MSW, LCSW  Licensed Education officer, environmental Health System  Mailing [place] [address] [place], [place] [address] Physical Address-[address], [place], [place] [address] Toll Free Main # [phone] Fax # [phone] Cell # [phone] [email or web address]

## 2022-05-22 NOTE — Patient Instructions (Signed)
Visit Information  Thank you for taking time to visit with me today. Please don't hesitate to contact me if I can be of assistance to you.   Following are the goals we discussed today:   Goals Addressed               This Visit's Progress     Reduce and Manage Symptoms of Depression and Grief. (pt-stated)   On track     Care Coordination Interventions:  Interventions Today    Flowsheet Row Most Recent Value  Chronic Disease Discussed/Reviewed   Chronic disease discussed/reviewed during today's visit Other  [Anxiety and Depression]  General Interventions   General Interventions Discussed/Reviewed General Interventions Discussed, General Interventions Reviewed, Community Resources  [Reviewed Medications & Consult with Physician Psychotropic Medication Regimen]  Exercise Interventions   Exercise Discussed/Reviewed Exercise Discussed, Exercise Reviewed, Physical Activity  Physical Activity Discussed/Reviewed Physical Activity Discussed, Physical Activity Reviewed, Types of exercise, Home Exercise Program (HEP)  [Performs Chair & Leg Exercises,  Begin Outpatient Physical Therapy on 05/26/2022]  Education Interventions   Education Provided Provided Printed Education  Mental Health Interventions   Mental Health Discussed/Reviewed Mental Health Discussed, Mental Health Reviewed, Coping Strategies  Nutrition Interventions   Nutrition Discussed/Reviewed Nutrition Discussed, Nutrition Reviewed  Pharmacy Interventions   Pharmacy Dicussed/Reviewed Pharmacy Topics Discussed, Pharmacy Topics Reviewed, Medications and their functions, Medication Adherence, Affording Medications  Medication Adherence Not taking medication  [Not Taking Vraylar as Prescribed - Taking Every Other Day]  Safety Interventions   Safety Discussed/Reviewed Safety Discussed, Safety Reviewed, Fall Risk, Home Safety  Home Safety Assistive Devices  Advanced Directive Interventions   Advanced Directives Discussed/Reviewed  Advanced Directives Discussed     Active Listening & Reflection Utilized.  Verbalization of Feelings Encouraged. Emotional Support Provided. Feelings of Grief & Loss Validated. Cognitive Behavioral Therapy Initiated. Acceptance & Commitment Therapy Performed. Client-Centered Therapy Indicated. Task-Centered Strategies Enacted. Problem-Solving Interventions Implemented. Solution-Focused Activities Employed. Grief & Loss Support Groups Reviewed. Self-Enrollment in Grief & Loss Support Group of Interest Emphasized, from List Provided. Continue to [place] through [employer], Friends, Recruitment consultant Members, Neighbors, [place]. Attend Initial Outpatient Physical & Occupational Therapy Services Appointment at [facility] ([address], [place], [place] [address]; # [phone]), Scheduled on 05/26/2022 at 1:00 PM. Attend Outpatient Physical & Occupational Therapy Services at [facility] ([address] [place], [place] [address]; # [phone]), Two Times Per Week, for 6 - 8 Weeks. CSW Collaboration with Primary Care Provider, Dr. Allyn Kenner (# [phone]), to Report Not Taking Psychotropic Medication (Vraylar) As Prescribed. ~ "It Really Helps & I Don't Want to Switch Medications, But It Makes Me Very Sleepy, So I Am Only Taking Vraylar Every Other Day". Sleep Hygiene Assessed & Techniques Promoted.          ~ "I Average About 10 Hours of Sleep Per Night."          ~ "I typically Go To Sleep Around 11:00 PM & Sleep Soundly Until 9:00 AM".          Our next appointment is by telephone on 06/01/2022 at 1:30 pm.  Please call the care guide team at [phone] if you need to cancel or reschedule your appointment.   If you are experiencing a Mental Health or [place] or need someone to talk to, please call the Suicide and Crisis Lifeline: 988 call the [place] National Suicide Prevention Lifeline:  (267) 655-8232 or TTY: 270-672-7314 TTY (270)242-1685) to talk to a trained counselor  call 1-800-273-TALK (toll free, 24 hour hotline) go to [facility] [address] [place] [phone]) call the [place]: 347-118-8482 call 911  Patient verbalizes understanding of instructions and care plan provided today and agrees to view in [place]. Active MyChart status and patient understanding of how to access instructions and care plan via MyChart confirmed with patient.     Telephone follow up appointment with care management team member scheduled for:  06/01/2022 at 1:30 pm.  Nat Christen, BSW, MSW, [place]  Licensed Clinical Social Worker  [place]  Mailing [place]. [address], [place], [place] [address] Physical Address-[address] [place], [place] [address] Toll Free Main # [phone] Fax # [phone] Cell # [phone] [email or web address]

## 2022-05-24 ENCOUNTER — Other Ambulatory Visit: Payer: Self-pay | Admitting: "Endocrinology

## 2022-05-24 DIAGNOSIS — E1165 Type 2 diabetes mellitus with hyperglycemia: Secondary | ICD-10-CM

## 2022-05-31 ENCOUNTER — Encounter: Payer: Self-pay | Admitting: "Endocrinology

## 2022-05-31 ENCOUNTER — Ambulatory Visit (INDEPENDENT_AMBULATORY_CARE_PROVIDER_SITE_OTHER): Payer: 59 | Admitting: "Endocrinology

## 2022-05-31 VITALS — BP 132/76 | HR 88 | Ht 69.0 in | Wt 208.2 lb

## 2022-05-31 DIAGNOSIS — E6609 Other obesity due to excess calories: Secondary | ICD-10-CM

## 2022-05-31 DIAGNOSIS — I10 Essential (primary) hypertension: Secondary | ICD-10-CM

## 2022-05-31 DIAGNOSIS — Z6830 Body mass index (BMI) 30.0-30.9, adult: Secondary | ICD-10-CM

## 2022-05-31 DIAGNOSIS — E782 Mixed hyperlipidemia: Secondary | ICD-10-CM | POA: Diagnosis not present

## 2022-05-31 DIAGNOSIS — E1165 Type 2 diabetes mellitus with hyperglycemia: Secondary | ICD-10-CM | POA: Diagnosis not present

## 2022-05-31 DIAGNOSIS — E66811 Obesity, class 1: Secondary | ICD-10-CM

## 2022-05-31 LAB — POCT GLYCOSYLATED HEMOGLOBIN (HGB A1C): HbA1c, POC (controlled diabetic range): 9.6 % — AB (ref 0.0–7.0)

## 2022-05-31 MED ORDER — TRULICITY 4.5 MG/0.5ML ~~LOC~~ SOAJ: 4.5000 mg | SUBCUTANEOUS | 2 refills | Status: DC

## 2022-05-31 NOTE — Patient Instructions (Signed)

## 2022-05-31 NOTE — Progress Notes (Signed)
05/31/2022, 4:27 PM        Endocrinology follow-up note   Subjective:    Patient ID: Melissa Tucker, female    DOB: 1950/04/20.  Melissa Tucker is seen in follow-up for management of currently uncontrolled symptomatic diabetes, hyperlipidemia, hypertension requested by  Celene Squibb, MD.   Past Medical History:  Diagnosis Date   Anxiety and depression    Chest tightness    DEEP VENOUS THROMBOPHLEBITIS 10/05/2009   Qualifier: History of  By: Lattie Haw, MD, Claud Kelp    Diabetes mellitus 1994   No insulin   DJD (degenerative joint disease)    History of DVT (deep vein thrombosis) 1989   Following childbirth   Hyperlipidemia    Hypertension    IBS (irritable bowel syndrome)    Rotator cuff syndrome of right shoulder 08/22/2011   Past Surgical History:  Procedure Laterality Date   [place]     COLONOSCOPY  2005   Normal findings   COLONOSCOPY WITH PROPOFOL N/A 11/03/2020   Procedure: COLONOSCOPY WITH PROPOFOL;  Surgeon: Harvel Quale, MD;  [facility];  Service: Gastroenterology;  Laterality: N/A;  8:30   JOINT REPLACEMENT N/A    Phreesia 02/28/2020   ORIF ANKLE FRACTURE Right 05/16/2019   Procedure: OPEN REDUCTION INTERNAL FIXATION (ORIF) ANKLE FRACTURE;  Surgeon: Shona Needles, MD;  Location: [place];  Service: Orthopedics;  Laterality: Right;   ORIF FEMUR FRACTURE Right 05/16/2019   Procedure: OPEN REDUCTION INTERNAL FIXATION (ORIF) DISTAL FEMUR FRACTURE;  Surgeon: Shona Needles, MD;  Location: [place];  Service: Orthopedics;  Laterality: Right;   SHOULDER SURGERY     Left shoulder manipulation with subcrominal decompression, capsulitis   TOTAL KNEE ARTHROPLASTY  2003   Left   TOTAL KNEE ARTHROPLASTY  2007   Right   Social History   Socioeconomic History   Marital status: Divorced    Spouse name: Not on file   Number of children: 1   Years of education: 70    Highest education level: 12th grade  Occupational History   Occupation: disabled    Fish farm manager: UNEMPLOYED  Tobacco Use   Smoking status: Former    Types: Cigarettes    Quit date: 04/10/1988    Years since quitting: 34.1    Passive exposure: Past   Smokeless tobacco: Former   Tobacco comments:    Minimal prior use  Vaping Use   Vaping Use: Never used  Substance and Sexual Activity   Alcohol use: No   Drug use: No   Sexual activity: Not Currently    Partners: Male    Birth control/protection: Surgical    Comment: hyst  Other Topics Concern   Not on file  Social History Narrative   Divorced   Resides with daughter, also has 2 sons   No regular exercise   Social Determinants of Health   Financial Resource Strain: Low Risk  (12/26/2021)   Overall Financial Resource Strain (CARDIA)    Difficulty of Paying Living Expenses: Not hard at all  Food Insecurity: No Food Insecurity (12/26/2021)   Hunger Vital Sign    Worried About Running Out of Food in the Last Year: Never true    [place] in the Last Year: Never true  Transportation Needs: No Transportation Needs (12/26/2021)   PRAPARE - Transportation    Lack of  Transportation (Medical): No    Lack of Transportation (Non-Medical): No  Physical Activity: Inactive (12/26/2021)   Exercise Vital Sign    Days of Exercise per Week: 0 days    Minutes of Exercise per Session: 0 min  Stress: No Stress Concern Present (12/26/2021)   [place]    Feeling of Stress : Not at all  Social Connections: Moderately Isolated (12/26/2021)   Social Connection and Isolation Panel [NHANES]    Frequency of Communication with Friends and Family: More than three times a week    Frequency of Social Gatherings with Friends and Family: More than three times a week    Attends Religious Services: More than 4 times per year    Active Member of [employer] or Organizations: No    Attends English as a second language teacher Meetings: Never    Marital Status: Divorced   Outpatient Encounter Medications as of 05/31/2022  Medication Sig   Dulaglutide (TRULICITY) 4.5 0000000 SOPN Inject 4.5 mg as directed once a week.   ALPRAZolam (XANAX) 0.5 MG tablet TAKE 1 TABLET BY MOUTH TWICE DAILY AS NEEDED FOR ANXIETY   amLODipine (NORVASC) 5 MG tablet TAKE 1 TABLET BY MOUTH ONCE A DAY. (Patient taking differently: Take 2 tablets by mouth daily.)   bisacodyl (DULCOLAX) 5 MG EC tablet Take 5 mg by mouth as needed for moderate constipation.   Blood Glucose Monitoring Suppl (ACCU-CHEK GUIDE) w/Device KIT USE TO TEST BLOOD GLUCOSE TWICE DAILY AS DIRECTED.   Cholecalciferol (VITAMIN D-3) 125 MCG (5000 UT) TABS Take 5,000 Units by mouth in the morning.   clobetasol cream (TEMOVATE) 0.05 % APPLY TO AFFECTED AREA TWICE DAILY FOR 2 WEEKS; THEN 2 TO 3 TIMES PER WEEK.   colesevelam (WELCHOL) 625 MG tablet Take 1,875 mg by mouth 2 (two) times daily as needed (irritable bowel syndrome).   cyanocobalamin (VITAMIN B12) 1000 MCG tablet Take by mouth.   dicyclomine (BENTYL) 10 MG capsule Take 10 mg by mouth daily as needed (IBS).   escitalopram (LEXAPRO) 20 MG tablet TAKE ONE TABLET BY MOUTH ONCE DAILY. (Patient taking differently: 10 mg.)   fluconazole (DIFLUCAN) 150 MG tablet TAKE 1 TABLET BY MOUTH NOW, AND 1 TABLET IN 3 DAYS.   FLUoxetine (PROZAC) 20 MG capsule Take 20 mg by mouth daily.   glipiZIDE (GLUCOTROL XL) 10 MG 24 hr tablet TAKE 1 TABLET BY MOUTH ONCE A DAY.   glucose blood (ACCU-CHEK GUIDE) test strip USE TO CHECK BLOOD GLUCOSE TWICE DAILY AS DIRECTED.   HYDROcodone-acetaminophen (NORCO) 10-325 MG tablet TAKE 1 TABLET BY MOUTH FOUR TIMES DAILY AS NEEDED.   lisinopril (ZESTRIL) 20 MG tablet Take 20 mg by mouth daily.   metFORMIN (GLUCOPHAGE) 1000 MG tablet Take 1 tablet (1,000 mg total) by mouth 2 (two) times daily with a meal.   metoCLOPramide (REGLAN) 5 MG tablet Take 5 mg by mouth as needed for nausea.    Multiple Vitamin (MULTIVITAMIN WITH MINERALS) TABS tablet Take 1 tablet by mouth daily. One A Day for Women   simvastatin (ZOCOR) 40 MG tablet Take 1 tablet (40 mg total) by mouth daily.   [DISCONTINUED] Dulaglutide (TRULICITY) 3 0000000 SOPN INJECT 3 MG INTO THE SKIN ONCE WEEKLY.   No facility-administered encounter medications on file as of 05/31/2022.    ALLERGIES: Allergies  Allergen Reactions   Peanut-Containing Drug Products Swelling    VACCINATION STATUS: Immunization History  Administered Date(s) Administered   Fluad Quad(high Dose 65+) 03/09/2020, 04/25/2021  PFIZER(Purple Top)SARS-COV-2 Vaccination 06/18/2019, 07/09/2019   PPD Test 12/17/2020   Pneumococcal Conjugate-13 03/09/2020    Diabetes She presents for her follow-up diabetic visit. She has type 2 diabetes mellitus. Onset time: She was diagnosed at approximate age of 89 years. Her disease course has been worsening. There are no hypoglycemic associated symptoms. Pertinent negatives for hypoglycemia include no confusion, headaches, pallor or seizures. Pertinent negatives for diabetes include no blurred vision, no chest pain, no fatigue, no polydipsia, no polyphagia and no polyuria. There are no hypoglycemic complications. Symptoms are worsening. Diabetic complications include retinopathy. Risk factors for coronary artery disease include diabetes mellitus, dyslipidemia, family history, hypertension, obesity, sedentary lifestyle, post-menopausal and tobacco exposure. Current diabetic treatments: She is currently on Invokana 300 mg p.o. daily metformin/glipizide,\ She is compliant with treatment some of the time. Her weight is decreasing steadily (She lost approximately 10 pounds since last visit.). She is following a generally unhealthy diet. When asked about meal planning, she reported none. She has not had a previous visit with a dietitian. She never participates in exercise. Her home blood glucose trend is increasing steadily.  Her breakfast blood glucose range is generally >200 mg/dl. Her bedtime blood glucose range is generally >200 mg/dl. Her overall blood glucose range is >200 mg/dl. (She presents with with loss of control of her glycemia with average blood glucose of 240 over the last 14 days.  Her point-of-care A1c is 9.6% increasing from 6.8%.    She did not document or report hypoglycemia.      ) An ACE inhibitor/angiotensin II receptor blocker is being taken. Eye exam is current.  Hyperlipidemia This is a chronic problem. The current episode started more than 1 year ago. The problem is controlled. Exacerbating diseases include diabetes and obesity. Pertinent negatives include no chest pain, myalgias or shortness of breath. Current antihyperlipidemic treatment includes statins and ezetimibe. Risk factors for coronary artery disease include dyslipidemia, diabetes mellitus, family history, obesity, hypertension, a sedentary lifestyle and post-menopausal.  Hypertension This is a chronic problem. The current episode started more than 1 year ago. The problem is uncontrolled. Pertinent negatives include no blurred vision, chest pain, headaches, palpitations or shortness of breath. Risk factors for coronary artery disease include diabetes mellitus, dyslipidemia, family history, obesity, post-menopausal state, sedentary lifestyle and smoking/tobacco exposure. Past treatments include ACE inhibitors. Hypertensive end-organ damage includes retinopathy.    Review of systems Limited as above.  Objective:    BP 132/76   Pulse 88   Ht 5' 9"$  (1.753 m)   Wt 208 lb 3.2 oz (94.4 kg)   BMI 30.75 kg/m   Wt Readings from Last 3 Encounters:  05/31/22 208 lb 3.2 oz (94.4 kg)  01/25/22 200 lb (90.7 kg)  11/14/21 183 lb 9.6 oz (83.3 kg)      Physical Exam- Limited  CMP     Component Value Date/Time   NA 139 10/18/2021 0000   K 4.7 10/18/2021 0000   CL 100 10/18/2021 0000   CO2 23 (A) 10/18/2021 0000   GLUCOSE 245 (H)  04/25/2021 1609   GLUCOSE 281 (H) 05/15/2019 1505   BUN 12 10/18/2021 0000   CREATININE 0.6 10/18/2021 0000   CREATININE 0.53 (L) 04/25/2021 1609   CREATININE 0.64 08/14/2018 1142   CALCIUM 10.3 10/18/2021 0000   PROT 7.1 04/25/2021 1609   ALBUMIN 4.5 10/18/2021 0000   ALBUMIN 5.0 (H) 04/25/2021 1609   AST 15 10/18/2021 0000   ALT 21 10/18/2021 0000   ALKPHOS 109 10/18/2021 0000  BILITOT 0.2 04/25/2021 1609   GFRNONAA 99 03/09/2020 1400   GFRNONAA 92 08/14/2018 1142   GFRAA 114 03/09/2020 1400   GFRAA 107 08/14/2018 1142     Diabetic Labs (most recent): Lab Results  Component Value Date   HGBA1C 9.6 (A) 05/31/2022   HGBA1C 6.8 10/18/2021   HGBA1C 8.1 (A) 07/06/2021     Lipid Panel ( most recent) Lipid Panel     Component Value Date/Time   CHOL 143 10/18/2021 0000   CHOL 163 03/29/2021 1402   TRIG 81 10/18/2021 0000   HDL 68 10/18/2021 0000   HDL 69 03/29/2021 1402   LDLCALC 59 10/18/2021 0000   LDLCALC 77 03/29/2021 1402     Assessment & Plan:   1. Uncontrolled type 2 diabetes mellitus with hyperglycemia ([place])  - JHADA HILKE has currently uncontrolled symptomatic type 2 DM since 72 years of age.  She presents with with loss of control of her glycemia with average blood glucose of 240 over the last 14 days.  Her point-of-care A1c is 9.6% increasing from 6.8%.    She did not document or report hypoglycemia.    -her diabetes is complicated by retinopathy, obesity/sedentary life, history of smoking and REGGINA OSTRANDER remains at a high risk for more acute and chronic complications which include CAD, CVA, CKD, retinopathy, and neuropathy. These are all discussed in detail with the patient.  - I have counseled her on diet management and weight loss, by adopting a carbohydrate restricted/protein rich diet.  - she acknowledges that there is a room for improvement in her food and drink choices. - Suggestion is made for her to avoid simple carbohydrates  from her diet  including Cakes, Sweet Desserts, Ice Cream, Soda (diet and regular), Sweet Tea, Candies, Chips, Cookies, Store Bought Juices, Alcohol , Artificial Sweeteners,  Coffee Creamer, and "Sugar-free" Products, Lemonade. This will help patient to have more stable blood glucose profile and potentially avoid unintended weight gain.  The following Lifestyle Medicine recommendations according to [facility]) were discussed and and offered to patient and she  agrees to start the journey:  A. Whole Foods, Plant-Based Nutrition comprising of fruits and vegetables, plant-based proteins, whole-grain carbohydrates was discussed in detail with the patient.   A list for source of those nutrients were also provided to the patient.  Patient will use only water or unsweetened tea for hydration. B.  The need to stay away from risky substances including alcohol, smoking; obtaining 7 to 9 hours of restorative sleep, at least 150 minutes of moderate intensity exercise weekly, the importance of healthy social connections,  and stress management techniques were discussed. C.  A full color page of  Calorie density of various food groups per pound showing examples of each food groups was provided to the patient.    - I encouraged her to switch to  unprocessed or minimally processed complex starch and increased protein intake (animal or plant source), fruits, and vegetables.  - she is advised to stick to a routine mealtimes to eat 3 meals  a day and avoid unnecessary snacks ( to snack only to correct hypoglycemia).   - I have approached her with the following individualized plan to manage diabetes and patient agrees:   -In light of the fact that she is presenting with worsening glycemic profile, she was approached for insulin treatment.  However, she wishes to avoid insulin treatment for now. - She wishes to go back to  her previous dose of Trulicity at 4.5 mg.  She did have side effects from this  dose previously, however would like to try it again.  I prescribed Trulicity 4.5 mg subcutaneously weekly, side effects and precautions discussed with her  -Additionally, she is advised to continue metformin 1000 mg p.o. twice daily-daily after breakfast and after supper.  She will continue to benefit from glipizide 10 mg p.o. daily at breakfast.  She is approached and willing to continue monitoring blood glucose 4 times a day-before meals and at bedtime and return in 2 weeks for reevaluation.   -She is encouraged to call clinic for blood glucose readings less than 70 or greater than 200x3.     2) BP/HTN:  -Blood pressure is controlled to target.  She is advised to continue her current medications including lisinopril 40 mg p.o. daily, Lasix 40 mg p.o. daily . She will be considered for additional treatment with hydrochlorothiazide on her next visit.   3) Lipids/HPL: Her recent lipid panel showed controlled LDL at 65.  She is advised to continue lovastatin 40 mg p.o. nightly.   Side effects and precautions discussed with her.      4)  Weight/Diet: Her BMI 30.75-she presents with 25 pounds weight gain since last visit.  She admits to dietary indiscretions.  She is a candidate for modest weight loss.  CDE Consult  has been  initiated , exercise, and detailed carbohydrates information provided.  5) Chronic Care/Health Maintenance:  -she  is on ACEI and Statin medications and  is encouraged to initiate and continue to follow up with Ophthalmology, Dentist,  Podiatrist at least yearly or according to recommendations, and advised to  stay away from smoking. I have recommended yearly flu vaccine and pneumonia vaccine at least every 5 years; moderate intensity exercise for up to 150 minutes weekly; and  sleep for at least 7 hours a day.  She did have normal screening ABI on May 11, 2020.  This study will be repeated in February 2027, or sooner if needed.  - I advised patient to maintain close  follow up with Celene Squibb, MD for primary care needs.  I spent  26  minutes in the care of the patient today including review of labs from [place], Lipids, Thyroid Function, Hematology (current and previous including abstractions from other facilities); face-to-face time discussing  her blood glucose readings/logs, discussing hypoglycemia and hyperglycemia episodes and symptoms, medications doses, her options of short and long term treatment based on the latest standards of care / guidelines;  discussion about incorporating lifestyle medicine;  and documenting the encounter. Risk reduction counseling performed per USPSTF guidelines to reduce  obesity and cardiovascular risk factors.     Please refer to Patient Instructions for Blood Glucose Monitoring and Insulin/Medications Dosing Guide"  in media tab for additional information. Please  also refer to " Patient Self Inventory" in the Media  tab for reviewed elements of pertinent patient history.  Ulyses Jarred participated in the discussions, expressed understanding, and voiced agreement with the above plans.  All questions were answered to her satisfaction. she is encouraged to contact clinic should she have any questions or concerns prior to her return visit.    Follow up plan: - Return in about 2 weeks (around 06/14/2022) for Bring Meter/CGM Device/Logs- A1c in Office.  Glade Lloyd, MD [facility] [address] [place], [place] [address] Phone: [phone]  Fax: [phone]    05/31/2022, 4:27 PM  This note was partially dictated with voice recognition software. Similar sounding words can be transcribed inadequately or may not  be corrected upon review.

## 2022-06-01 ENCOUNTER — Ambulatory Visit: Payer: Self-pay | Admitting: *Deleted

## 2022-06-01 ENCOUNTER — Encounter: Payer: Self-pay | Admitting: *Deleted

## 2022-06-01 ENCOUNTER — Other Ambulatory Visit: Payer: Self-pay | Admitting: "Endocrinology

## 2022-06-01 DIAGNOSIS — E1165 Type 2 diabetes mellitus with hyperglycemia: Secondary | ICD-10-CM

## 2022-06-01 NOTE — Patient Instructions (Signed)
Visit Information  Thank you for taking time to visit with me today. Please don't hesitate to contact me if I can be of assistance to you.   Following are the goals we discussed today:   Goals Addressed               This Visit's Progress     Reduce and Manage Symptoms of Depression and Grief. (pt-stated)   On track     Care Coordination Interventions:  Interventions Today    Flowsheet Row Most Recent Value  Chronic Disease   Chronic disease during today's visit Diabetes  General Interventions   General Interventions Discussed/Reviewed General Interventions Discussed, General Interventions Reviewed  Exercise Interventions   Exercise Discussed/Reviewed Exercise Discussed, Exercise Reviewed, Physical Activity  [Encouraged]  Physical Activity Discussed/Reviewed Physical Activity Discussed, Physical Activity Reviewed, Types of exercise, Home Exercise Program (HEP)  [Encouraged]  Education Interventions   Education Provided Provided Printed Education, Provided Education  Provided Verbal Education On Mental Health/Coping with Illness, Exercise, Blood Sugar Monitoring, Community Resources  Mental Health Interventions   Mental Health Discussed/Reviewed Mental Health Discussed, Mental Health Reviewed, Coping Strategies, Anxiety, Depression  Nutrition Interventions   Nutrition Discussed/Reviewed Nutrition Discussed, Nutrition Reviewed, Carbohydrate meal planning, Portion sizes, Decreasing sugar intake  Pharmacy Interventions   Pharmacy Dicussed/Reviewed Pharmacy Topics Discussed, Pharmacy Topics Reviewed, Medication Adherence  Safety Interventions   Safety Discussed/Reviewed Safety Discussed, Safety Reviewed, Fall Risk, Home Safety  Home Safety Assistive Devices     Active Listening & Reflection Utilized.  Verbalization of Feelings Encouraged. Emotional Support Provided. Feelings of Grief & Loss Validated. Feelings of Frustration Regarding Loss of Independence  Acknowledged. Cognitive Behavioral Therapy Performed. Acceptance & Commitment Therapy Initiated. Client-Centered Therapy Indicated. Task-Centered Strategies Employed Problem-Solving Interventions Implemented. Solution-Focused Activities Indicated. Grief & Loss Support Groups Reviewed. Self-Enrollment in Grief & Loss Support Group of Interest Emphasized. Crisis Information, Agencies, Services & Resources Discussed. Meal Preparation & Planning, Decreased Sugar & Carbohydrate Intake & Portion Control Mentioned. Increased Level of Activity & Exercise Activated. Confirmed Completion of Outpatient Physical & Occupational Therapy Services at [facility] ([address] [place], [place] [address]; # [phone]). Please Contact Receptionist at Dr. Josue Hector Office [phone]), to Schedule Follow-Up Appointment with Primary Care Provider, Dr. Cecile Sheerer.      Our next appointment is by telephone on 06/15/2022 at 9:30 am.  Please call the care guide team at [phone] if you need to cancel or reschedule your appointment.   If you are experiencing a Mental Health or [place] or need someone to talk to, please call the Suicide and Crisis Lifeline: 988 call the [place] National Suicide Prevention Lifeline: 856-872-6930 or TTY: 308-845-7279 TTY 770-061-5838) to talk to a trained counselor call 1-800-273-TALK (toll free, 24 hour hotline) go to [facility] [address], [place] [phone]) call the [place]: [phone] call 911  Patient verbalizes understanding of instructions and care plan provided today and agrees to view in [place]. Active MyChart status and patient understanding of how to access instructions and care plan via MyChart confirmed with patient.     Telephone follow up appointment with care management team member scheduled for:  06/15/2022 at 9:30 am.  Nat Christen, BSW,  MSW, [place]  Licensed Clinical Social Worker  [place]  [employer] [place]. [address] [place], [place] [address] Physical Address-[address] [place], [place] [address] Toll Free Main # [phone] Fax # [phone]  Cell # [phone] [email or web address]

## 2022-06-01 NOTE — Patient Outreach (Signed)
  Care Coordination   Follow Up Visit Note   06/01/2022  Name: Melissa Tucker MRN: [id number] DOB: 05-14-1950  Melissa Tucker is a 72 y.o. year old female who sees Cecile Sheerer, NP for primary care. I spoke with Ulyses Jarred by phone today.  What matters to the patients health and wellness today?  Reduce and Manage Symptoms of Depression and Grief.   Goals Addressed               This Visit's Progress     Reduce and Manage Symptoms of Depression and Grief. (pt-stated)   On track     Care Coordination Interventions:  Interventions Today    Flowsheet Row Most Recent Value  Chronic Disease   Chronic disease during today's visit Diabetes  General Interventions   General Interventions Discussed/Reviewed General Interventions Discussed, General Interventions Reviewed  Exercise Interventions   Exercise Discussed/Reviewed Exercise Discussed, Exercise Reviewed, Physical Activity  [Encouraged]  Physical Activity Discussed/Reviewed Physical Activity Discussed, Physical Activity Reviewed, Types of exercise, Home Exercise Program (HEP)  [Encouraged]  Education Interventions   Education Provided Provided Printed Education, Provided Education  Provided Verbal Education On Mental Health/Coping with Illness, Exercise, Blood Sugar Monitoring, Community Resources  Mental Health Interventions   Mental Health Discussed/Reviewed Mental Health Discussed, Mental Health Reviewed, Coping Strategies, Anxiety, Depression  Nutrition Interventions   Nutrition Discussed/Reviewed Nutrition Discussed, Nutrition Reviewed, Carbohydrate meal planning, Portion sizes, Decreasing sugar intake  Pharmacy Interventions   Pharmacy Dicussed/Reviewed Pharmacy Topics Discussed, Pharmacy Topics Reviewed, Medication Adherence  Safety Interventions   Safety Discussed/Reviewed Safety Discussed, Safety Reviewed, Fall Risk, Home Safety  Home Safety Assistive Devices     Active Listening & Reflection Utilized.   Verbalization of Feelings Encouraged. Emotional Support Provided. Feelings of Grief & Loss Validated. Feelings of Frustration Regarding Loss of Independence Acknowledged. Cognitive Behavioral Therapy Performed. Acceptance & Commitment Therapy Initiated. Client-Centered Therapy Indicated. Task-Centered Strategies Employed Problem-Solving Interventions Implemented. Solution-Focused Activities Indicated. Grief & Loss Support Groups Reviewed. Self-Enrollment in Grief & Loss Support Group of Interest Emphasized. Crisis Information, Agencies, Services & Resources Discussed. Meal Preparation & Planning, Decreased Sugar & Carbohydrate Intake & Portion Control Mentioned. Increased Level of Activity & Exercise Activated. Confirmed Completion of Outpatient Physical & Occupational Therapy Services at [facility] ([address], [place], [place] [address]; # [phone]). Please Contact Receptionist at Dr. Josue Hector Office [phone]), to Schedule Follow-Up Appointment with Primary Care Provider, Dr. Cecile Sheerer.      SDOH assessments and interventions completed:  Yes.  Care Coordination Interventions:  Yes, provided.   Follow up plan: Follow up call scheduled for 06/15/2022 at 9:30 am.  Encounter Outcome:  Pt. Visit Completed.   Nat Christen, BSW, MSW, LCSW  Licensed Education officer, environmental [facility]  Mailing [place] [address] [place], [place] [address] Physical Address[address], [place], [place] [address] Toll Free Main # [phone] Fax # [phone] Cell # [phone] [email or web address]

## 2022-06-05 ENCOUNTER — Other Ambulatory Visit: Payer: Self-pay | Admitting: Adult Health

## 2022-06-05 ENCOUNTER — Other Ambulatory Visit: Payer: Self-pay | Admitting: "Endocrinology

## 2022-06-05 DIAGNOSIS — E1165 Type 2 diabetes mellitus with hyperglycemia: Secondary | ICD-10-CM

## 2022-06-06 ENCOUNTER — Other Ambulatory Visit: Payer: Self-pay | Admitting: "Endocrinology

## 2022-06-09 ENCOUNTER — Ambulatory Visit: Payer: 59 | Admitting: Adult Health

## 2022-06-12 DIAGNOSIS — D649 Anemia, unspecified: Secondary | ICD-10-CM | POA: Diagnosis not present

## 2022-06-12 DIAGNOSIS — E785 Hyperlipidemia, unspecified: Secondary | ICD-10-CM | POA: Diagnosis not present

## 2022-06-12 DIAGNOSIS — E1165 Type 2 diabetes mellitus with hyperglycemia: Secondary | ICD-10-CM | POA: Diagnosis not present

## 2022-06-15 ENCOUNTER — Ambulatory Visit (INDEPENDENT_AMBULATORY_CARE_PROVIDER_SITE_OTHER): Payer: 59 | Admitting: "Endocrinology

## 2022-06-15 ENCOUNTER — Encounter: Payer: Self-pay | Admitting: "Endocrinology

## 2022-06-15 ENCOUNTER — Encounter: Payer: Self-pay | Admitting: *Deleted

## 2022-06-15 ENCOUNTER — Ambulatory Visit: Payer: Self-pay | Admitting: *Deleted

## 2022-06-15 VITALS — BP 126/74 | HR 72 | Ht 69.0 in | Wt 201.4 lb

## 2022-06-15 DIAGNOSIS — I10 Essential (primary) hypertension: Secondary | ICD-10-CM | POA: Diagnosis not present

## 2022-06-15 DIAGNOSIS — E1165 Type 2 diabetes mellitus with hyperglycemia: Secondary | ICD-10-CM | POA: Diagnosis not present

## 2022-06-15 DIAGNOSIS — E782 Mixed hyperlipidemia: Secondary | ICD-10-CM

## 2022-06-15 NOTE — Progress Notes (Signed)
06/15/2022, 3:24 PM        Endocrinology follow-up note   Subjective:    Patient ID: Melissa Tucker, female    DOB: 03/23/1951.  Melissa Tucker is seen in follow-up for management of currently uncontrolled symptomatic diabetes, hyperlipidemia, hypertension requested by  Cecile Sheerer, NP.   Past Medical History:  Diagnosis Date   Anxiety and depression    Chest tightness    DEEP VENOUS THROMBOPHLEBITIS 10/05/2009   Qualifier: History of  By: Lattie Haw, MD, Claud Kelp    Diabetes mellitus 1994   No insulin   DJD (degenerative joint disease)    History of DVT (deep vein thrombosis) 1989   Following childbirth   Hyperlipidemia    Hypertension    IBS (irritable bowel syndrome)    Rotator cuff syndrome of right shoulder 08/22/2011   Past Surgical History:  Procedure Laterality Date   ABDOMINAL HYSTERECTOMY     CESAREAN SECTION  1988   CHOLECYSTECTOMY     COLONOSCOPY  2005   Normal findings   COLONOSCOPY WITH PROPOFOL N/A 11/03/2020   Procedure: COLONOSCOPY WITH PROPOFOL;  Surgeon: Harvel Quale, MD;  [facility];  Service: Gastroenterology;  Laterality: N/A;  8:30   JOINT REPLACEMENT N/A    Phreesia 02/28/2020   ORIF ANKLE FRACTURE Right 05/16/2019   Procedure: OPEN REDUCTION INTERNAL FIXATION (ORIF) ANKLE FRACTURE;  Surgeon: Shona Needles, MD;  Location: [facility];  Service: Orthopedics;  Laterality: Right;   ORIF FEMUR FRACTURE Right 05/16/2019   Procedure: OPEN REDUCTION INTERNAL FIXATION (ORIF) DISTAL FEMUR FRACTURE;  Surgeon: Shona Needles, MD;  Location: [facility];  Service: Orthopedics;  Laterality: Right;   SHOULDER SURGERY     Left shoulder manipulation with subcrominal decompression, capsulitis   TOTAL KNEE ARTHROPLASTY  2003   Left   TOTAL KNEE ARTHROPLASTY  2007   Right   Social History   Socioeconomic History   Marital status: Divorced    Spouse name: Not on file   Number of children: 1   Years of education:  82   Highest education level: 12th grade  Occupational History   Occupation: disabled    Fish farm manager: UNEMPLOYED  Tobacco Use   Smoking status: Former    Types: Cigarettes    Quit date: 04/10/1988    Years since quitting: 34.2    Passive exposure: Past   Smokeless tobacco: Former   Tobacco comments:    Minimal prior use  Vaping Use   Vaping Use: Never used  Substance and Sexual Activity   Alcohol use: No   Drug use: No   Sexual activity: Not Currently    Partners: Male    Birth control/protection: Surgical    Comment: hyst  Other Topics Concern   Not on file  Social History Narrative   Divorced   Resides with daughter, also has 2 sons   No regular exercise   Social Determinants of Health   Financial Resource Strain: Low Risk  (12/26/2021)   Overall Financial Resource Strain (CARDIA)    Difficulty of Paying Living Expenses: Not hard at all  Food Insecurity: No Food Insecurity (12/26/2021)   Hunger Vital Sign    Worried About Running Out of Food in the Last Year: Never true    [place] in the Last Year: Never true  Transportation Needs: No Transportation Needs (12/26/2021)   PRAPARE - Transportation    Lack of Transportation (  Medical): No    Lack of Transportation (Non-Medical): No  Physical Activity: Inactive (12/26/2021)   Exercise Vital Sign    Days of Exercise per Week: 0 days    Minutes of Exercise per Session: 0 min  Stress: No Stress Concern Present (12/26/2021)   [place]    Feeling of Stress : Not at all  Social Connections: Moderately Isolated (12/26/2021)   Social Connection and Isolation Panel [NHANES]    Frequency of Communication with Friends and Family: More than three times a week    Frequency of Social Gatherings with Friends and Family: More than three times a week    Attends Religious Services: More than 4 times per year    Active Member of [employer] or Organizations: No    Attends  Archivist Meetings: Never    Marital Status: Divorced   Outpatient Encounter Medications as of 06/15/2022  Medication Sig   ACCU-CHEK GUIDE test strip USE TO CHECK BLOOD GLUCOSE TWICE DAILY AS DIRECTED.   ALPRAZolam (XANAX) 0.5 MG tablet TAKE 1 TABLET BY MOUTH TWICE DAILY AS NEEDED FOR ANXIETY   amLODipine (NORVASC) 5 MG tablet TAKE 1 TABLET BY MOUTH ONCE A DAY. (Patient taking differently: Take 2 tablets by mouth daily.)   bisacodyl (DULCOLAX) 5 MG EC tablet Take 5 mg by mouth as needed for moderate constipation.   Blood Glucose Monitoring Suppl (ACCU-CHEK GUIDE) w/Device KIT USE TO TEST BLOOD GLUCOSE TWICE DAILY AS DIRECTED.   Cholecalciferol (VITAMIN D-3) 125 MCG (5000 UT) TABS Take 5,000 Units by mouth in the morning.   clobetasol cream (TEMOVATE) 0.05 % APPLY TO AFFECTED AREA TWICE DAILY FOR 2 WEEKS; THEN 2 TO 3 TIMES PER WEEK.   colesevelam (WELCHOL) 625 MG tablet Take 1,875 mg by mouth 2 (two) times daily as needed (irritable bowel syndrome).   cyanocobalamin (VITAMIN B12) 1000 MCG tablet Take by mouth.   dicyclomine (BENTYL) 10 MG capsule Take 10 mg by mouth daily as needed (IBS).   Dulaglutide (TRULICITY) 4.5 0000000 SOPN Inject 4.5 mg as directed once a week.   escitalopram (LEXAPRO) 20 MG tablet TAKE ONE TABLET BY MOUTH ONCE DAILY. (Patient taking differently: 10 mg.)   fluconazole (DIFLUCAN) 150 MG tablet TAKE 1 TABLET BY MOUTH NOW, AND 1 TABLET IN 3 DAYS.   FLUoxetine (PROZAC) 20 MG capsule Take 20 mg by mouth daily.   glipiZIDE (GLUCOTROL XL) 10 MG 24 hr tablet TAKE 1 TABLET BY MOUTH ONCE A DAY.   HYDROcodone-acetaminophen (NORCO) 10-325 MG tablet TAKE 1 TABLET BY MOUTH FOUR TIMES DAILY AS NEEDED.   lisinopril (ZESTRIL) 20 MG tablet Take 20 mg by mouth daily.   metFORMIN (GLUCOPHAGE) 1000 MG tablet TAKE ONE TABLET BY MOUTH TWICE A DAY WITH A MEAL   metoCLOPramide (REGLAN) 5 MG tablet Take 5 mg by mouth as needed for nausea.   Multiple Vitamin (MULTIVITAMIN WITH  MINERALS) TABS tablet Take 1 tablet by mouth daily. One A Day for Women   simvastatin (ZOCOR) 40 MG tablet Take 1 tablet (40 mg total) by mouth daily.   No facility-administered encounter medications on file as of 06/15/2022.    ALLERGIES: Allergies  Allergen Reactions   Peanut-Containing Drug Products Swelling    VACCINATION STATUS: Immunization History  Administered Date(s) Administered   Fluad Quad(high Dose 65+) 03/09/2020, 04/25/2021   PFIZER(Purple Top)SARS-COV-2 Vaccination 06/18/2019, 07/09/2019   PPD Test 12/17/2020   Pneumococcal Conjugate-13 03/09/2020    Diabetes She presents for  her follow-up diabetic visit. She has type 2 diabetes mellitus. Onset time: She was diagnosed at approximate age of 39 years. Her disease course has been worsening. There are no hypoglycemic associated symptoms. Pertinent negatives for hypoglycemia include no confusion, headaches, pallor or seizures. Pertinent negatives for diabetes include no blurred vision, no chest pain, no fatigue, no polydipsia, no polyphagia and no polyuria. There are no hypoglycemic complications. Symptoms are improving. Diabetic complications include retinopathy. Risk factors for coronary artery disease include diabetes mellitus, dyslipidemia, family history, hypertension, obesity, sedentary lifestyle, post-menopausal and tobacco exposure. Current diabetic treatments: She is currently on Invokana 300 mg p.o. daily metformin/glipizide,\ She is compliant with treatment some of the time. Her weight is fluctuating minimally (She lost approximately 10 pounds since last visit.). She is following a generally unhealthy diet. When asked about meal planning, she reported none. She has not had a previous visit with a dietitian. She never participates in exercise. Her home blood glucose trend is decreasing steadily. Her overall blood glucose range is >200 mg/dl. (She presents with her meter showing average blood glucose of 222 for the last 7  days.  Is an improvement from 240 during her last presentation.  She did not document hypoglycemia.  She wishes to avoid insulin treatment for now.       ) An ACE inhibitor/angiotensin II receptor blocker is being taken. Eye exam is current.  Hyperlipidemia This is a chronic problem. The current episode started more than 1 year ago. The problem is controlled. Exacerbating diseases include diabetes and obesity. Pertinent negatives include no chest pain, myalgias or shortness of breath. Current antihyperlipidemic treatment includes statins and ezetimibe. Risk factors for coronary artery disease include dyslipidemia, diabetes mellitus, family history, obesity, hypertension, a sedentary lifestyle and post-menopausal.  Hypertension This is a chronic problem. The current episode started more than 1 year ago. The problem is uncontrolled. Pertinent negatives include no blurred vision, chest pain, headaches, palpitations or shortness of breath. Risk factors for coronary artery disease include diabetes mellitus, dyslipidemia, family history, obesity, post-menopausal state, sedentary lifestyle and smoking/tobacco exposure. Past treatments include ACE inhibitors. Hypertensive end-organ damage includes retinopathy.    Review of systems Limited as above.  Objective:    BP 126/74   Pulse 72   Ht '5\' 9"'$  (1.753 m)   Wt 201 lb 6.4 oz (91.4 kg)   BMI 29.74 kg/m   Wt Readings from Last 3 Encounters:  06/15/22 201 lb 6.4 oz (91.4 kg)  05/31/22 208 lb 3.2 oz (94.4 kg)  01/25/22 200 lb (90.7 kg)      Physical Exam- Limited  CMP     Component Value Date/Time   NA 139 10/18/2021 0000   K 4.7 10/18/2021 0000   CL 100 10/18/2021 0000   CO2 23 (A) 10/18/2021 0000   GLUCOSE 245 (H) 04/25/2021 1609   GLUCOSE 281 (H) 05/15/2019 1505   BUN 12 10/18/2021 0000   CREATININE 0.6 10/18/2021 0000   CREATININE 0.53 (L) 04/25/2021 1609   CREATININE 0.64 08/14/2018 1142   CALCIUM 10.3 10/18/2021 0000   PROT 7.1  04/25/2021 1609   ALBUMIN 4.5 10/18/2021 0000   ALBUMIN 5.0 (H) 04/25/2021 1609   AST 15 10/18/2021 0000   ALT 21 10/18/2021 0000   ALKPHOS 109 10/18/2021 0000   BILITOT 0.2 04/25/2021 1609   GFRNONAA 99 03/09/2020 1400   GFRNONAA 92 08/14/2018 1142   GFRAA 114 03/09/2020 1400   GFRAA 107 08/14/2018 1142     Diabetic Labs (most recent):  Lab Results  Component Value Date   HGBA1C 9.6 (A) 05/31/2022   HGBA1C 6.8 10/18/2021   HGBA1C 8.1 (A) 07/06/2021     Lipid Panel ( most recent) Lipid Panel     Component Value Date/Time   CHOL 143 10/18/2021 0000   CHOL 163 03/29/2021 1402   TRIG 81 10/18/2021 0000   HDL 68 10/18/2021 0000   HDL 69 03/29/2021 1402   LDLCALC 59 10/18/2021 0000   LDLCALC 77 03/29/2021 1402     Assessment & Plan:   1. Uncontrolled type 2 diabetes mellitus with hyperglycemia ([place])  - ARLANDA BONSALL has currently uncontrolled symptomatic type 2 DM since 72 years of age.  She presents with her meter showing average blood glucose of 222 for the last 7 days.  Is an improvement from 240 during her last presentation.  She did not document hypoglycemia.  She wishes to avoid insulin treatment for now.    -her diabetes is complicated by retinopathy, obesity/sedentary life, history of smoking and KRISTOPHER HASSE remains at a high risk for more acute and chronic complications which include CAD, CVA, CKD, retinopathy, and neuropathy. These are all discussed in detail with the patient.  - I have counseled her on diet management and weight loss, by adopting a carbohydrate restricted/protein rich diet. - she acknowledges that there is a room for improvement in her food and drink choices. - Suggestion is made for her to avoid simple carbohydrates  from her diet including Cakes, Sweet Desserts, Ice Cream, Soda (diet and regular), Sweet Tea, Candies, Chips, Cookies, Store Bought Juices, Alcohol , Artificial Sweeteners,  Coffee Creamer, and "Sugar-free" Products, Lemonade. This  will help patient to have more stable blood glucose profile and potentially avoid unintended weight gain.  The following Lifestyle Medicine recommendations according to [facility]) were discussed and and offered to patient and she  agrees to start the journey:  A. Whole Foods, Plant-Based Nutrition comprising of fruits and vegetables, plant-based proteins, whole-grain carbohydrates was discussed in detail with the patient.   A list for source of those nutrients were also provided to the patient.  Patient will use only water or unsweetened tea for hydration. B.  The need to stay away from risky substances including alcohol, smoking; obtaining 7 to 9 hours of restorative sleep, at least 150 minutes of moderate intensity exercise weekly, the importance of healthy social connections,  and stress management techniques were discussed. C.  A full color page of  Calorie density of various food groups per pound showing examples of each food groups was provided to the patient.   - I encouraged her to switch to  unprocessed or minimally processed complex starch and increased protein intake (animal or plant source), fruits, and vegetables.  - she is advised to stick to a routine mealtimes to eat 3 meals  a day and avoid unnecessary snacks ( to snack only to correct hypoglycemia).   - I have approached her with the following individualized plan to manage diabetes and patient agrees:   -In light of the fact that she is presenting with worsening glycemic profile, she was approached for insulin treatment.  However, she wishes to avoid insulin treatment until her next measurement of A1c.   - She is tolerating the highest dose of Trulicity.  She is advised to continue Trulicity 4.5 mg subcutaneously weekly.    -Additionally, she is advised to continue metformin 1000 mg p.o. twice daily-daily after breakfast and  after supper.  -She is advised to continue glipizide 10 mg p.o. daily  at breakfast.  -She is advised to continue monitoring blood glucose twice a day-daily before breakfast and at bedtime.  -She is encouraged to call clinic for blood glucose readings less than 70 or greater than 200x3.     2) BP/HTN:  -Her blood pressure is controlled to target.  She is advised to continue her current medications including lisinopril 40 mg p.o. daily, Lasix 40 mg p.o. daily . She will be considered for additional treatment with hydrochlorothiazide on her next visit.   3) Lipids/HPL: Her recent lipid panel showed controlled LDL at 65.  She is advised to continue lovastatin 40 mg p.o. nightly.   Side effects and precautions discussed with her.    4)  Weight/Diet: Her BMI 29.74-  She admits to dietary indiscretions.  She is a candidate for modest weight loss.  CDE Consult  has been  initiated , exercise, and detailed carbohydrates information provided.  5) Chronic Care/Health Maintenance:  -she  is on ACEI and Statin medications and  is encouraged to initiate and continue to follow up with Ophthalmology, Dentist,  Podiatrist at least yearly or according to recommendations, and advised to  stay away from smoking. I have recommended yearly flu vaccine and pneumonia vaccine at least every 5 years; moderate intensity exercise for up to 150 minutes weekly; and  sleep for at least 7 hours a day.  She did have normal screening ABI on May 11, 2020.  This study will be repeated in February 2027, or sooner if needed.  - I advised patient to maintain close follow up with Cecile Sheerer, NP for primary care needs.   I spent  26  minutes in the care of the patient today including review of labs from [facility], Lipids, Thyroid Function, Hematology (current and previous including abstractions from other facilities); face-to-face time discussing  her blood glucose readings/logs, discussing hypoglycemia and hyperglycemia episodes and symptoms, medications doses, her options of short and long term  treatment based on the latest standards of care / guidelines;  discussion about incorporating lifestyle medicine;  and documenting the encounter. Risk reduction counseling performed per USPSTF guidelines to reduce obesity and cardiovascular risk factors.     Please refer to Patient Instructions for Blood Glucose Monitoring and Insulin/Medications Dosing Guide"  in media tab for additional information. Please  also refer to " Patient Self Inventory" in the Media  tab for reviewed elements of pertinent patient history.  Ulyses Jarred participated in the discussions, expressed understanding, and voiced agreement with the above plans.  All questions were answered to her satisfaction. she is encouraged to contact clinic should she have any questions or concerns prior to her return visit.   Follow up plan: - Return in about 3 months (around 09/15/2022) for F/U with Pre-visit Labs, Meter/CGM/Logs, A1c here.  Glade Lloyd, MD [facility] [address] [place], [place] [address] Phone: [phone]  Fax: [phone]    06/15/2022, 3:24 PM  This note was partially dictated with voice recognition software. Similar sounding words can be transcribed inadequately or may not  be corrected upon review.

## 2022-06-15 NOTE — Patient Outreach (Signed)
  Care Coordination   Follow Up Visit Note   06/15/2022  Name: Melissa Tucker MRN: [id number] DOB: 21-Nov-1950  Melissa Tucker is a 72 y.o. year old female who sees Cecile Sheerer, NP for primary care. I spoke with Melissa Tucker by phone today.  What matters to the patients health and wellness today?  Reduce and Manage Symptoms of Depression and Grief.   Goals Addressed               This Visit's Progress     Reduce and Manage Symptoms of Depression and Grief. (pt-stated)   On track     Care Coordination Interventions:  Interventions Today    Flowsheet Row Most Recent Value  Chronic Disease   Chronic disease during today's visit Diabetes  General Interventions   General Interventions Discussed/Reviewed General Interventions Discussed, General Interventions Reviewed  Exercise Interventions   Exercise Discussed/Reviewed Exercise Discussed, Exercise Reviewed, Physical Activity  [Encouraged]  Physical Activity Discussed/Reviewed Physical Activity Discussed, Physical Activity Reviewed, Types of exercise, Home Exercise Program (HEP)  [Encouraged]  Education Interventions   Education Provided Provided Printed Education, Provided Education  Provided Verbal Education On Mental Health/Coping with Illness, Exercise, Blood Sugar Monitoring, Community Resources  Mental Health Interventions   Mental Health Discussed/Reviewed Mental Health Discussed, Mental Health Reviewed, Coping Strategies, Anxiety, Depression  Nutrition Interventions   Nutrition Discussed/Reviewed Nutrition Discussed, Nutrition Reviewed, Carbohydrate meal planning, Portion sizes, Decreasing sugar intake  Pharmacy Interventions   Pharmacy Dicussed/Reviewed Pharmacy Topics Discussed, Pharmacy Topics Reviewed, Medication Adherence  Safety Interventions   Safety Discussed/Reviewed Safety Discussed, Safety Reviewed, Fall Risk, Home Safety  Home Safety Assistive Devices     Active Listening & Reflection Utilized.   Verbalization of Feelings Encouraged. Emotional Support Provided. Feelings of Grief & Sadness Validated. Feelings of Loneliness & Isolation Acknowledged. Cognitive Behavioral Therapy Performed. Acceptance & Commitment Therapy Initiated. Client-Centered Therapy Indicated. Task-Centered Strategies Employed Problem-Solving Interventions Activated. Solution-Focused Activities Implemented. Grief & Loss Support Groups Reviewed. Self-Enrollment in Grief & Loss Support Group of Interest Emphasized. Please Increase Level of Activity & Exercise to 30-60 Minutes a Day, 4 Days a Week, or 150 Minutes of Moderate Intensity Exercise Weekly, Combining Stretch, Strength & Aerobic Activities.  Please Adhere to Recommendations from Endocrinologist:  Consume Less Calories, Avoid Processed Carbohydrates, Processed Meats, Unhealthy Fats, Complex Starch, Increase Proteins, Incorporate More Fruits & Vegetables, Consume Plenty of Water, Maintain Portion Control & Routinely Monitor Blood Sugar. Please Engage in Activities of Interest, or Self-Enroll in [employer], from List Provided. Please Keep Follow-Up Appointment with Primary Care Provider, Cecile Sheerer, Nurse Practitioner with Dr. Josue Hector Office [phone]), Scheduled on 06/19/2022 at 10:00 AM.      SDOH assessments and interventions completed:  Yes.  Care Coordination Interventions:  Yes, provided.   Follow up plan: Follow up call scheduled for 06/30/2022 at 9:30 am.  Encounter Outcome:  Pt. Visit Completed.   Nat Christen, BSW, MSW, LCSW  Licensed Education officer, environmental [facility]  Mailing [place] [address], [place], [place] [address] Physical Address[address], [place],  [address] Toll Free Main # [phone] Fax # [phone] Cell # [phone] [email or web address]

## 2022-06-15 NOTE — Patient Instructions (Signed)

## 2022-06-15 NOTE — Patient Instructions (Signed)
Visit Information  Thank you for taking time to visit with me today. Please don't hesitate to contact me if I can be of assistance to you.   Following are the goals we discussed today:   Goals Addressed               This Visit's Progress     Reduce and Manage Symptoms of Depression and Grief. (pt-stated)   On track     Care Coordination Interventions:  Interventions Today    Flowsheet Row Most Recent Value  Chronic Disease   Chronic disease during today's visit Diabetes  General Interventions   General Interventions Discussed/Reviewed General Interventions Discussed, General Interventions Reviewed  Exercise Interventions   Exercise Discussed/Reviewed Exercise Discussed, Exercise Reviewed, Physical Activity  [Encouraged]  Physical Activity Discussed/Reviewed Physical Activity Discussed, Physical Activity Reviewed, Types of exercise, Home Exercise Program (HEP)  [Encouraged]  Education Interventions   Education Provided Provided Printed Education, Provided Education  Provided Verbal Education On Mental Health/Coping with Illness, Exercise, Blood Sugar Monitoring, Community Resources  Mental Health Interventions   Mental Health Discussed/Reviewed Mental Health Discussed, Mental Health Reviewed, Coping Strategies, Anxiety, Depression  Nutrition Interventions   Nutrition Discussed/Reviewed Nutrition Discussed, Nutrition Reviewed, Carbohydrate meal planning, Portion sizes, Decreasing sugar intake  Pharmacy Interventions   Pharmacy Dicussed/Reviewed Pharmacy Topics Discussed, Pharmacy Topics Reviewed, Medication Adherence  Safety Interventions   Safety Discussed/Reviewed Safety Discussed, Safety Reviewed, Fall Risk, Home Safety  Home Safety Assistive Devices     Active Listening & Reflection Utilized.  Verbalization of Feelings Encouraged. Emotional Support Provided. Feelings of Grief & Sadness Validated. Feelings of Loneliness & Isolation Acknowledged. Cognitive Behavioral  Therapy Performed. Acceptance & Commitment Therapy Initiated. Client-Centered Therapy Indicated. Task-Centered Strategies Employed Problem-Solving Interventions Activated. Solution-Focused Activities Implemented. Grief & Loss Support Groups Reviewed. Self-Enrollment in Grief & Loss Support Group of Interest Emphasized. Please Increase Level of Activity & Exercise to 30-60 Minutes a Day, 4 Days a Week, or 150 Minutes of Moderate Intensity Exercise Weekly, Combining Stretch, Strength & Aerobic Activities.  Please Adhere to Recommendations from Endocrinologist:  Consume Less Calories, Avoid Processed Carbohydrates, Processed Meats, Unhealthy Fats, Complex Starch, Increase Proteins, Incorporate More Fruits & Vegetables, Consume Plenty of Water, Maintain Portion Control & Routinely Monitor Blood Sugar. Please Engage in Activities of Interest, or Self-Enroll in [employer], from List Provided. Please Keep Follow-Up Appointment with Primary Care Provider, Cecile Sheerer, Nurse Practitioner with Dr. Josue Hector Office [phone]), Scheduled on 06/19/2022 at 10:00 AM.      Our next appointment is by telephone on 06/30/2022 at 9:30 am.  Please call the care guide team at [phone] if you need to cancel or reschedule your appointment.   If you are experiencing a Mental Health or [place] or need someone to talk to, please call the Suicide and Crisis Lifeline: 988 call the [place] National Suicide Prevention Lifeline: 279-539-8210 or TTY: 661 338 1264 TTY 563-053-9936) to talk to a trained counselor call 1-800-273-TALK (toll free, 24 hour hotline) go to [facility] [address], [place] [phone]) call the [place]: [phone] call 911  Patient verbalizes understanding of instructions and care plan provided today and agrees to view in [place]. Active MyChart status and patient understanding of how  to access instructions and care plan via MyChart confirmed with patient.     Telephone follow up appointment with care management team member scheduled for: 06/30/2022 at 9:30 am.  Nat Christen, BSW, MSW, LCSW  Licensed  Clinical Social Worker  [facility] [place] [address] [place], [place] [address] Physical Address-[address], [place], [place] [address] Toll Free Main # [phone] Fax # [phone] Cell # [phone] [email or web address]

## 2022-06-19 DIAGNOSIS — I10 Essential (primary) hypertension: Secondary | ICD-10-CM | POA: Diagnosis not present

## 2022-06-19 DIAGNOSIS — Z0001 Encounter for general adult medical examination with abnormal findings: Secondary | ICD-10-CM | POA: Diagnosis not present

## 2022-06-19 DIAGNOSIS — R809 Proteinuria, unspecified: Secondary | ICD-10-CM | POA: Diagnosis not present

## 2022-06-19 DIAGNOSIS — D649 Anemia, unspecified: Secondary | ICD-10-CM | POA: Diagnosis not present

## 2022-06-19 DIAGNOSIS — G4700 Insomnia, unspecified: Secondary | ICD-10-CM | POA: Diagnosis not present

## 2022-06-19 DIAGNOSIS — E1165 Type 2 diabetes mellitus with hyperglycemia: Secondary | ICD-10-CM | POA: Diagnosis not present

## 2022-06-19 DIAGNOSIS — I700 Atherosclerosis of aorta: Secondary | ICD-10-CM | POA: Diagnosis not present

## 2022-06-19 DIAGNOSIS — E785 Hyperlipidemia, unspecified: Secondary | ICD-10-CM | POA: Diagnosis not present

## 2022-06-19 DIAGNOSIS — R112 Nausea with vomiting, unspecified: Secondary | ICD-10-CM | POA: Diagnosis not present

## 2022-06-22 DIAGNOSIS — H40003 Preglaucoma, unspecified, bilateral: Secondary | ICD-10-CM | POA: Diagnosis not present

## 2022-06-22 DIAGNOSIS — H25813 Combined forms of age-related cataract, bilateral: Secondary | ICD-10-CM | POA: Diagnosis not present

## 2022-06-29 ENCOUNTER — Other Ambulatory Visit: Payer: Self-pay | Admitting: Adult Health

## 2022-06-30 ENCOUNTER — Ambulatory Visit: Payer: Self-pay | Admitting: *Deleted

## 2022-06-30 ENCOUNTER — Encounter: Payer: Self-pay | Admitting: *Deleted

## 2022-06-30 NOTE — Patient Outreach (Signed)
  Care Coordination   Follow Up Visit Note   06/30/2022  Name: Melissa Tucker MRN: [id number] DOB: 1950-04-15  Melissa Tucker is a 72 y.o. year old female who sees Cecile Sheerer, NP for primary care. I spoke with Ulyses Jarred by phone today.  What matters to the patients health and wellness today?   Reduce and Manage Symptoms of Depression and Grief.   Goals Addressed               This Visit's Progress     COMPLETED: Reduce and Manage Symptoms of Depression and Grief. (pt-stated)   On track     Care Coordination Interventions:  Interventions Today    Flowsheet Row Most Recent Value  Chronic Disease   Chronic disease during today's visit Diabetes  General Interventions   General Interventions Discussed/Reviewed General Interventions Discussed, General Interventions Reviewed  Exercise Interventions   Exercise Discussed/Reviewed Exercise Discussed, Exercise Reviewed, Physical Activity  [Encouraged]  Physical Activity Discussed/Reviewed Physical Activity Discussed, Physical Activity Reviewed, Types of exercise, Home Exercise Program (HEP)  [Encouraged]  Education Interventions   Education Provided Provided Printed Education, Provided Education  Provided Verbal Education On Mental Health/Coping with Illness, Exercise, Blood Sugar Monitoring, Community Resources  Mental Health Interventions   Mental Health Discussed/Reviewed Mental Health Discussed, Mental Health Reviewed, Coping Strategies, Anxiety, Depression  Nutrition Interventions   Nutrition Discussed/Reviewed Nutrition Discussed, Nutrition Reviewed, Carbohydrate meal planning, Portion sizes, Decreasing sugar intake  Pharmacy Interventions   Pharmacy Dicussed/Reviewed Pharmacy Topics Discussed, Pharmacy Topics Reviewed, Medication Adherence  Safety Interventions   Safety Discussed/Reviewed Safety Discussed, Safety Reviewed, Fall Risk, Home Safety  Home Safety Assistive Devices     Active Listening & Reflection Utilized.   Verbalization of Feelings Encouraged. Emotional Support Provided. Feelings of Grief & Sadness Validated. Feelings of Loneliness & Isolation Acknowledged. Cognitive Behavioral Therapy Performed. Acceptance & Commitment Therapy Initiated. Client-Centered Therapy Indicated. Task-Centered Strategies Employed Problem-Solving Interventions Activated. Solution-Focused Activities Implemented. Grief & Loss Support Groups Reviewed. Self-Enrollment in Grief & Loss Support Group of Interest Emphasized. Please Increase Level of Activity & Exercise to 30-60 Minutes a Day, 4 Days a Week, or 150 Minutes of Moderate Intensity Exercise Weekly, Combining Stretch, Strength & Aerobic Activities.  Please Engage in Activities of Interest, or Self-Enroll in [employer], from List Provided. Please Contact CSW Directly (# [phone]), if You Have Questions, Need Assistance, or If Additional Social Work Needs Are Identified in The Near Future.      SDOH assessments and interventions completed:  Yes.  Care Coordination Interventions:  Yes, provided.   Follow up plan: No further intervention required.   Encounter Outcome:  Pt. Visit Completed.   Melissa Tucker, BSW, MSW, LCSW  Licensed Education officer, [facility]  Mailing [place] [address], [place], [place] [address] Physical Address[address] [place], [place] [address] Toll Free Main # [phone] Fax # [phone] Cell # [phone] [email or web address]

## 2022-06-30 NOTE — Patient Instructions (Signed)
Visit Information  Thank you for taking time to visit with me today. Please don't hesitate to contact me if I can be of assistance to you.   Following are the goals we discussed today:   Goals Addressed               This Visit's Progress     COMPLETED: Reduce and Manage Symptoms of Depression and Grief. (pt-stated)   On track     Care Coordination Interventions:  Interventions Today    Flowsheet Row Most Recent Value  Chronic Disease   Chronic disease during today's visit Diabetes  General Interventions   General Interventions Discussed/Reviewed General Interventions Discussed, General Interventions Reviewed  Exercise Interventions   Exercise Discussed/Reviewed Exercise Discussed, Exercise Reviewed, Physical Activity  [Encouraged]  Physical Activity Discussed/Reviewed Physical Activity Discussed, Physical Activity Reviewed, Types of exercise, Home Exercise Program (HEP)  [Encouraged]  Education Interventions   Education Provided Provided Printed Education, Provided Education  Provided Verbal Education On Mental Health/Coping with Illness, Exercise, Blood Sugar Monitoring, Community Resources  Mental Health Interventions   Mental Health Discussed/Reviewed Mental Health Discussed, Mental Health Reviewed, Coping Strategies, Anxiety, Depression  Nutrition Interventions   Nutrition Discussed/Reviewed Nutrition Discussed, Nutrition Reviewed, Carbohydrate meal planning, Portion sizes, Decreasing sugar intake  Pharmacy Interventions   Pharmacy Dicussed/Reviewed Pharmacy Topics Discussed, Pharmacy Topics Reviewed, Medication Adherence  Safety Interventions   Safety Discussed/Reviewed Safety Discussed, Safety Reviewed, Fall Risk, Home Safety  Home Safety Assistive Devices     Active Listening & Reflection Utilized.  Verbalization of Feelings Encouraged. Emotional Support Provided. Feelings of Grief & Sadness Validated. Feelings of Loneliness & Isolation Acknowledged. Cognitive  Behavioral Therapy Performed. Acceptance & Commitment Therapy Initiated. Client-Centered Therapy Indicated. Task-Centered Strategies Employed Problem-Solving Interventions Activated. Solution-Focused Activities Implemented. Grief & Loss Support Groups Reviewed. Self-Enrollment in Grief & Loss Support Group of Interest Emphasized. Please Increase Level of Activity & Exercise to 30-60 Minutes a Day, 4 Days a Week, or 150 Minutes of Moderate Intensity Exercise Weekly, Combining Stretch, Strength & Aerobic Activities.  Please Engage in Activities of Interest, or Self-Enroll in [employer], from List Provided. Please Contact CSW Directly (# [phone]), if You Have Questions, Need Assistance, or If Additional Social Work Needs Are Identified in The Near Future.      Please call the care guide team at [phone] if you need to cancel or reschedule your appointment.   If you are experiencing a Mental Health or [place] or need someone to talk to, please call the Suicide and Crisis Lifeline: 988 call the [place] National Suicide Prevention Lifeline: 612 792 6717 or TTY: (430)878-1578 TTY 458-766-8950) to talk to a trained counselor call 1-800-273-TALK (toll free, 24 hour hotline) go to [facility] [address] [place] [phone]) call the [place]: [phone] call 911  Patient verbalizes understanding of instructions and care plan provided today and agrees to view in [place]. Active MyChart status and patient understanding of how to access instructions and care plan via MyChart confirmed with patient.     No further follow up required.  Melissa Tucker, BSW, MSW, LCSW  Licensed Education officer, [facility]  [address]ailing [place] [address], [place], [place] [address] Physical Address[address] [place], [place] [address] Toll Free Main #  [phone] Fax # [phone] Cell # [phone] [email or web address]

## 2022-07-04 ENCOUNTER — Other Ambulatory Visit: Payer: Self-pay | Admitting: Adult Health

## 2022-07-05 DIAGNOSIS — H25012 Cortical age-related cataract, left eye: Secondary | ICD-10-CM | POA: Diagnosis not present

## 2022-07-05 DIAGNOSIS — H25811 Combined forms of age-related cataract, right eye: Secondary | ICD-10-CM | POA: Diagnosis not present

## 2022-07-05 DIAGNOSIS — H2512 Age-related nuclear cataract, left eye: Secondary | ICD-10-CM | POA: Diagnosis not present

## 2022-07-05 DIAGNOSIS — Z961 Presence of intraocular lens: Secondary | ICD-10-CM | POA: Diagnosis not present

## 2022-07-15 ENCOUNTER — Other Ambulatory Visit: Payer: Self-pay | Admitting: Adult Health

## 2022-07-19 DIAGNOSIS — H25011 Cortical age-related cataract, right eye: Secondary | ICD-10-CM | POA: Diagnosis not present

## 2022-07-19 DIAGNOSIS — H2511 Age-related nuclear cataract, right eye: Secondary | ICD-10-CM | POA: Diagnosis not present

## 2022-07-19 DIAGNOSIS — Z961 Presence of intraocular lens: Secondary | ICD-10-CM | POA: Diagnosis not present

## 2022-08-02 DIAGNOSIS — Z961 Presence of intraocular lens: Secondary | ICD-10-CM | POA: Diagnosis not present

## 2022-08-07 ENCOUNTER — Other Ambulatory Visit: Payer: Self-pay | Admitting: "Endocrinology

## 2022-08-07 ENCOUNTER — Other Ambulatory Visit: Payer: Self-pay | Admitting: Adult Health

## 2022-08-07 DIAGNOSIS — E1165 Type 2 diabetes mellitus with hyperglycemia: Secondary | ICD-10-CM

## 2022-08-10 ENCOUNTER — Other Ambulatory Visit: Payer: Self-pay | Admitting: "Endocrinology

## 2022-08-24 ENCOUNTER — Other Ambulatory Visit: Payer: Self-pay | Admitting: Adult Health

## 2022-08-24 ENCOUNTER — Other Ambulatory Visit: Payer: Self-pay | Admitting: "Endocrinology

## 2022-08-30 ENCOUNTER — Other Ambulatory Visit: Payer: Self-pay | Admitting: "Endocrinology

## 2022-08-30 DIAGNOSIS — E1165 Type 2 diabetes mellitus with hyperglycemia: Secondary | ICD-10-CM

## 2022-09-08 ENCOUNTER — Other Ambulatory Visit: Payer: Self-pay | Admitting: Adult Health

## 2022-09-20 DIAGNOSIS — Z79899 Other long term (current) drug therapy: Secondary | ICD-10-CM | POA: Diagnosis not present

## 2022-09-21 DIAGNOSIS — E782 Mixed hyperlipidemia: Secondary | ICD-10-CM | POA: Diagnosis not present

## 2022-09-21 DIAGNOSIS — E1165 Type 2 diabetes mellitus with hyperglycemia: Secondary | ICD-10-CM | POA: Diagnosis not present

## 2022-09-22 LAB — COMPREHENSIVE METABOLIC PANEL
ALT: 25 IU/L (ref 0–32)
AST: 25 IU/L (ref 0–40)
Albumin/Globulin Ratio: 2.1
Albumin: 4.8 g/dL (ref 3.8–4.8)
Alkaline Phosphatase: 107 IU/L (ref 44–121)
BUN/Creatinine Ratio: 16 (ref 12–28)
BUN: 11 mg/dL (ref 8–27)
Bilirubin Total: 0.6 mg/dL (ref 0.0–1.2)
CO2: 26 mmol/L (ref 20–29)
Calcium: 11.1 mg/dL — ABNORMAL HIGH (ref 8.7–10.3)
Chloride: 99 mmol/L (ref 96–106)
Creatinine, Ser: 0.69 mg/dL (ref 0.57–1.00)
Globulin, Total: 2.3 g/dL (ref 1.5–4.5)
Glucose: 147 mg/dL — ABNORMAL HIGH (ref 70–99)
Potassium: 4.5 mmol/L (ref 3.5–5.2)
Sodium: 139 mmol/L (ref 134–144)
Total Protein: 7.1 g/dL (ref 6.0–8.5)
eGFR: 93 mL/min/{1.73_m2} (ref >59)

## 2022-09-22 LAB — LIPID PANEL
Chol/HDL Ratio: 2.6 ratio (ref 0.0–4.4)
Cholesterol, Total: 146 mg/dL (ref 100–199)
HDL: 57 mg/dL (ref >39)
LDL Chol Calc (NIH): 72 mg/dL (ref 0–99)
Triglycerides: 88 mg/dL (ref 0–149)
VLDL Cholesterol Cal: 17 mg/dL (ref 5–40)

## 2022-09-22 LAB — T4, FREE: Free T4: 1.58 ng/dL (ref 0.82–1.77)

## 2022-09-22 LAB — TSH: TSH: 0.799 u[IU]/mL (ref 0.450–4.500)

## 2022-09-26 ENCOUNTER — Ambulatory Visit (INDEPENDENT_AMBULATORY_CARE_PROVIDER_SITE_OTHER): Payer: 59 | Admitting: "Endocrinology

## 2022-09-26 ENCOUNTER — Encounter: Payer: Self-pay | Admitting: "Endocrinology

## 2022-09-26 VITALS — BP 144/76 | HR 108 | Ht 69.0 in | Wt 184.6 lb

## 2022-09-26 DIAGNOSIS — E1165 Type 2 diabetes mellitus with hyperglycemia: Secondary | ICD-10-CM | POA: Diagnosis not present

## 2022-09-26 DIAGNOSIS — Z7985 Long-term (current) use of injectable non-insulin antidiabetic drugs: Secondary | ICD-10-CM | POA: Diagnosis not present

## 2022-09-26 DIAGNOSIS — E6609 Other obesity due to excess calories: Secondary | ICD-10-CM

## 2022-09-26 DIAGNOSIS — Z6830 Body mass index (BMI) 30.0-30.9, adult: Secondary | ICD-10-CM

## 2022-09-26 DIAGNOSIS — E782 Mixed hyperlipidemia: Secondary | ICD-10-CM

## 2022-09-26 DIAGNOSIS — I10 Essential (primary) hypertension: Secondary | ICD-10-CM

## 2022-09-26 LAB — POCT GLYCOSYLATED HEMOGLOBIN (HGB A1C): HbA1c, POC (controlled diabetic range): 6.6 % (ref 0.0–7.0)

## 2022-09-26 NOTE — Progress Notes (Signed)
09/26/2022, 2:33 PM        Endocrinology follow-up note   Subjective:    Patient ID: Melissa Tucker, female    DOB: 09-20-1950.  Melissa Tucker is seen in follow-up for management of currently uncontrolled symptomatic diabetes, hyperlipidemia, hypertension requested by  Lupita Raider, NP.   Past Medical History:  Diagnosis Date   Anxiety and depression    Chest tightness    DEEP VENOUS THROMBOPHLEBITIS 10/05/2009   Qualifier: History of  By: Dietrich Pates, MD, Javier Docker    Diabetes mellitus 1994   No insulin   DJD (degenerative joint disease)    History of DVT (deep vein thrombosis) 1989   Following childbirth   Hyperlipidemia    Hypertension    IBS (irritable bowel syndrome)    Rotator cuff syndrome of right shoulder 08/22/2011   Past Surgical History:  Procedure Laterality Date   ABDOMINAL HYSTERECTOMY     CESAREAN SECTION  1988   CHOLECYSTECTOMY     COLONOSCOPY  2005   Normal findings   COLONOSCOPY WITH PROPOFOL N/A 11/03/2020   Procedure: COLONOSCOPY WITH PROPOFOL;  Surgeon: Dolores Frame, MD;  Location: [facility];  Service: Gastroenterology;  Laterality: N/A;  8:30   JOINT REPLACEMENT N/A    Phreesia 02/28/2020   ORIF ANKLE FRACTURE Right 05/16/2019   Procedure: OPEN REDUCTION INTERNAL FIXATION (ORIF) ANKLE FRACTURE;  Surgeon: Roby Lofts, MD;  Location: [facility] OR;  Service: Orthopedics;  Laterality: Right;   ORIF FEMUR FRACTURE Right 05/16/2019   Procedure: OPEN REDUCTION INTERNAL FIXATION (ORIF) DISTAL FEMUR FRACTURE;  Surgeon: Roby Lofts, MD;  Location: [facility] OR;  Service: Orthopedics;  Laterality: Right;   SHOULDER SURGERY     Left shoulder manipulation with subcrominal decompression, capsulitis   TOTAL KNEE ARTHROPLASTY  2003   Left   TOTAL KNEE ARTHROPLASTY  2007   Right   Social History   Socioeconomic History   Marital status: Divorced    Spouse name: Not on file   Number of children: 1   Years of education:  37   Highest education level: 12th grade  Occupational History   Occupation: disabled    Associate Professor: UNEMPLOYED  Tobacco Use   Smoking status: Former    Types: Cigarettes    Quit date: 04/10/1988    Years since quitting: 34.4    Passive exposure: Past   Smokeless tobacco: Former   Tobacco comments:    Minimal prior use  Vaping Use   Vaping Use: Never used  Substance and Sexual Activity   Alcohol use: No   Drug use: No   Sexual activity: Not Currently    Partners: Male    Birth control/protection: Surgical    Comment: hyst  Other Topics Concern   Not on file  Social History Narrative   Divorced   Resides with daughter, also has 2 sons   No regular exercise   Social Determinants of Health   Financial Resource Strain: Low Risk  (12/26/2021)   Overall Financial Resource Strain (CARDIA)    Difficulty of Paying Living Expenses: Not hard at all  Food Insecurity: No Food Insecurity (12/26/2021)   Hunger Vital Sign    Worried About Running Out of Food in the Last Year: Never true    Ran Out of Food in the Last Year: Never true  Transportation Needs: No Transportation Needs (12/26/2021)   PRAPARE - Transportation    Lack of Transportation (  Medical): No    Lack of Transportation (Non-Medical): No  Physical Activity: Inactive (12/26/2021)   Exercise Vital Sign    Days of Exercise per Week: 0 days    Minutes of Exercise per Session: 0 min  Stress: No Stress Concern Present (12/26/2021)   [employer] of Occupational Health - Occupational Stress Questionnaire    Feeling of Stress : Not at all  Social Connections: Moderately Isolated (12/26/2021)   Social Connection and Isolation Panel [NHANES]    Frequency of Communication with Friends and Family: More than three times a week    Frequency of Social Gatherings with Friends and Family: More than three times a week    Attends Religious Services: More than 4 times per year    Active Member of [employer] or Organizations: No    Attends  Banker Meetings: Never    Marital Status: Divorced   Outpatient Encounter Medications as of 09/26/2022  Medication Sig   ALPRAZolam (XANAX) 0.5 MG tablet TAKE 1 TABLET BY MOUTH TWICE DAILY AS NEEDED FOR ANXIETY   amLODipine (NORVASC) 5 MG tablet TAKE 1 TABLET BY MOUTH ONCE A DAY. (Patient taking differently: Take 2 tablets by mouth daily.)   bisacodyl (DULCOLAX) 5 MG EC tablet Take 5 mg by mouth as needed for moderate constipation.   Blood Glucose Monitoring Suppl (ACCU-CHEK GUIDE) w/Device KIT USE TO TEST BLOOD GLUCOSE TWICE DAILY AS DIRECTED.   Cholecalciferol (VITAMIN D-3) 125 MCG (5000 UT) TABS Take 5,000 Units by mouth in the morning.   clobetasol cream (TEMOVATE) 0.05 % APPLY TO AFFECTED AREA TWICE DAILY FOR 2 WEEKS; THEN 2 TO 3 TIMES PER WEEK.   colesevelam (WELCHOL) 625 MG tablet Take 1,875 mg by mouth 2 (two) times daily as needed (irritable bowel syndrome).   cyanocobalamin (VITAMIN B12) 1000 MCG tablet Take by mouth.   dicyclomine (BENTYL) 10 MG capsule Take 10 mg by mouth daily as needed (IBS).   Dulaglutide (TRULICITY) 4.5 MG/0.5ML SOPN INJECT 4.5 MG INTO THE SKIN WEEKLY.   escitalopram (LEXAPRO) 20 MG tablet TAKE ONE TABLET BY MOUTH ONCE DAILY. (Patient taking differently: 10 mg.)   fluconazole (DIFLUCAN) 150 MG tablet TAKE 1 TABLET BY MOUTH NOW, AND 1 TABLET IN 3 DAYS.   FLUoxetine (PROZAC) 20 MG capsule Take 20 mg by mouth daily.   glipiZIDE (GLUCOTROL XL) 10 MG 24 hr tablet TAKE 1 TABLET BY MOUTH ONCE A DAY.   glucose blood (ACCU-CHEK GUIDE) test strip USE TO CHECK BLOOD GLUCOSE TWICE DAILY AS DIRECTED.   HYDROcodone-acetaminophen (NORCO) 10-325 MG tablet TAKE 1 TABLET BY MOUTH FOUR TIMES DAILY AS NEEDED.   lisinopril (ZESTRIL) 20 MG tablet Take 20 mg by mouth daily.   metFORMIN (GLUCOPHAGE) 1000 MG tablet TAKE ONE TABLET BY MOUTH TWICE A DAY WITH A MEAL   metoCLOPramide (REGLAN) 5 MG tablet Take 5 mg by mouth as needed for nausea.   Multiple Vitamin  (MULTIVITAMIN WITH MINERALS) TABS tablet Take 1 tablet by mouth daily. One A Day for Women   simvastatin (ZOCOR) 40 MG tablet Take 1 tablet (40 mg total) by mouth daily.   No facility-administered encounter medications on file as of 09/26/2022.    ALLERGIES: Allergies  Allergen Reactions   Peanut-Containing Drug Products Swelling    VACCINATION STATUS: Immunization History  Administered Date(s) Administered   Fluad Quad(high Dose 65+) 03/09/2020, 04/25/2021   PFIZER(Purple Top)SARS-COV-2 Vaccination 06/18/2019, 07/09/2019   PPD Test 12/17/2020   Pneumococcal Conjugate-13 03/09/2020    Diabetes She presents  for her follow-up diabetic visit. She has type 2 diabetes mellitus. Onset time: She was diagnosed at approximate age of 41 years. Her disease course has been improving. There are no hypoglycemic associated symptoms. Pertinent negatives for hypoglycemia include no confusion, headaches, pallor or seizures. Pertinent negatives for diabetes include no blurred vision, no chest pain, no fatigue, no polydipsia, no polyphagia and no polyuria. There are no hypoglycemic complications. Symptoms are improving. Diabetic complications include retinopathy. Risk factors for coronary artery disease include diabetes mellitus, dyslipidemia, family history, hypertension, obesity, sedentary lifestyle, post-menopausal and tobacco exposure. Current diabetic treatments: She is currently on Invokana 300 mg p.o. daily metformin/glipizide,\ She is compliant with treatment some of the time. Her weight is decreasing steadily. She is following a generally unhealthy diet. When asked about meal planning, she reported none. She has not had a previous visit with a dietitian. She never participates in exercise. Her home blood glucose trend is decreasing steadily. Her breakfast blood glucose range is generally 140-180 mg/dl. Her bedtime blood glucose range is generally 140-180 mg/dl. Her overall blood glucose range is 140-180  mg/dl. (She presents with her meter showing improving glycemic profile, average 162 for the last 9 days.  Her point-of-care A1c is 6.6% improving.  She did not document hypoglycemia.  She wishes to avoid insulin treatment for now.       ) An ACE inhibitor/angiotensin II receptor blocker is being taken. Eye exam is current.  Hyperlipidemia This is a chronic problem. The current episode started more than 1 year ago. The problem is controlled. Exacerbating diseases include diabetes and obesity. Pertinent negatives include no chest pain, myalgias or shortness of breath. Current antihyperlipidemic treatment includes statins and ezetimibe. Risk factors for coronary artery disease include dyslipidemia, diabetes mellitus, family history, obesity, hypertension, a sedentary lifestyle and post-menopausal.  Hypertension This is a chronic problem. The current episode started more than 1 year ago. The problem is uncontrolled. Pertinent negatives include no blurred vision, chest pain, headaches, palpitations or shortness of breath. Risk factors for coronary artery disease include diabetes mellitus, dyslipidemia, family history, obesity, post-menopausal state, sedentary lifestyle and smoking/tobacco exposure. Past treatments include ACE inhibitors. Hypertensive end-organ damage includes retinopathy.    Review of systems Limited as above.  Objective:    BP (!) 144/76   Pulse (!) 108   Ht 5\' 9"  (1.753 m)   Wt 184 lb 9.6 oz (83.7 kg)   BMI 27.26 kg/m   Wt Readings from Last 3 Encounters:  09/26/22 184 lb 9.6 oz (83.7 kg)  06/15/22 201 lb 6.4 oz (91.4 kg)  05/31/22 208 lb 3.2 oz (94.4 kg)      Physical Exam- Limited  CMP     Component Value Date/Time   NA 139 09/21/2022 1052   K 4.5 09/21/2022 1052   CL 99 09/21/2022 1052   CO2 26 09/21/2022 1052   GLUCOSE 147 (H) 09/21/2022 1052   GLUCOSE 281 (H) 05/15/2019 1505   BUN 11 09/21/2022 1052   CREATININE 0.69 09/21/2022 1052   CREATININE 0.64  08/14/2018 1142   CALCIUM 11.1 (H) 09/21/2022 1052   PROT 7.1 09/21/2022 1052   ALBUMIN 4.8 09/21/2022 1052   AST 25 09/21/2022 1052   ALT 25 09/21/2022 1052   ALKPHOS 107 09/21/2022 1052   BILITOT 0.6 09/21/2022 1052   GFRNONAA 99 03/09/2020 1400   GFRNONAA 92 08/14/2018 1142   GFRAA 114 03/09/2020 1400   GFRAA 107 08/14/2018 1142     Diabetic Labs (most recent): Lab Results  Component Value Date   HGBA1C 6.6 09/26/2022   HGBA1C 9.6 (A) 05/31/2022   HGBA1C 6.8 10/18/2021     Lipid Panel ( most recent) Lipid Panel     Component Value Date/Time   CHOL 146 09/21/2022 1052   TRIG 88 09/21/2022 1052   HDL 57 09/21/2022 1052   CHOLHDL 2.6 09/21/2022 1052   LDLCALC 72 09/21/2022 1052     Assessment & Plan:   1. Uncontrolled type 2 diabetes mellitus with hyperglycemia (HCC)  - Melissa Tucker has currently uncontrolled symptomatic type 2 DM since 72 years of age.  She presents with her meter showing improving glycemic profile, average 162 for the last 9 days.  Her point-of-care A1c is 6.6% improving.  She did not document hypoglycemia.  She wishes to avoid insulin treatment for now.    -her diabetes is complicated by retinopathy, obesity/sedentary life, history of smoking and Melissa Tucker remains at a high risk for more acute and chronic complications which include CAD, CVA, CKD, retinopathy, and neuropathy. These are all discussed in detail with the patient.  - I have counseled her on diet management and weight loss, by adopting a carbohydrate restricted/protein rich diet.  - she acknowledges that there is a room for improvement in her food and drink choices. - Suggestion is made for her to avoid simple carbohydrates  from her diet including Cakes, Sweet Desserts, Ice Cream, Soda (diet and regular), Sweet Tea, Candies, Chips, Cookies, Store Bought Juices, Alcohol , Artificial Sweeteners,  Coffee Creamer, and "Sugar-free" Products, Lemonade. This will help patient to have more  stable blood glucose profile and potentially avoid unintended weight gain.  The following Lifestyle Medicine recommendations according to American College of Lifestyle Medicine  [facility]) were discussed and and offered to patient and she  agrees to start the journey:  A. Whole Foods, Plant-Based Nutrition comprising of fruits and vegetables, plant-based proteins, whole-grain carbohydrates was discussed in detail with the patient.   A list for source of those nutrients were also provided to the patient.  Patient will use only water or unsweetened tea for hydration. B.  The need to stay away from risky substances including alcohol, smoking; obtaining 7 to 9 hours of restorative sleep, at least 150 minutes of moderate intensity exercise weekly, the importance of healthy social connections,  and stress management techniques were discussed. C.  A full color page of  Calorie density of various food groups per pound showing examples of each food groups was provided to the patient.   - I encouraged her to switch to  unprocessed or minimally processed complex starch and increased protein intake (animal or plant source), fruits, and vegetables.  - she is advised to stick to a routine mealtimes to eat 3 meals  a day and avoid unnecessary snacks ( to snack only to correct hypoglycemia).   - I have approached her with the following individualized plan to manage diabetes and patient agrees:   -In light of her presentation with near target glycemic profile, she will not need insulin treatment for now.  She is motivated to stay engaged with lifestyle medicine nutrition as discussed above.   -She is advised to continue Trulicity 4.5 mg subcutaneously weekly.  She is also on metformin 1000 mg p.o. twice daily and glipizide 10 mg XL p.o. daily at breakfast.  -She is advised to continue monitoring blood glucose twice a day-daily before breakfast and at bedtime.  -She is encouraged to call clinic for blood glucose  readings less than 70 or greater than 200x3.     2) BP/HTN:  -Her blood pressure is controlled to near target.  She is advised to continue her current medications including lisinopril 40 mg p.o. daily, Lasix 40 mg p.o. daily . She will be considered for additional treatment with hydrochlorothiazide on her next visit.   3) Lipids/HPL: Her recent lipid panel showed LDL controlled to target at 72.  She is advised to continue lovastatin 40 mg p.o. nightly.    Side effects and precautions discussed with her.    4)  Weight/Diet: Her BMI  27.26-  She has achieved 15 pounds of weight loss recently.  She is a candidate for possible weight loss.  CDE Consult  has been  initiated , exercise, and detailed carbohydrates information provided.  5) Chronic Care/Health Maintenance:  -she  is on ACEI and Statin medications and  is encouraged to initiate and continue to follow up with Ophthalmology, Dentist,  Podiatrist at least yearly or according to recommendations, and advised to  stay away from smoking. I have recommended yearly flu vaccine and pneumonia vaccine at least every 5 years; moderate intensity exercise for up to 150 minutes weekly; and  sleep for at least 7 hours a day.  She did have normal screening ABI on May 11, 2020.  This study will be repeated in February 2027, or sooner if needed.  - I advised patient to maintain close follow up with Lupita Raider, NP for primary care needs.   I spent  26  minutes in the care of the patient today including review of labs from CMP, Lipids, Thyroid Function, Hematology (current and previous including abstractions from other facilities); face-to-face time discussing  her blood glucose readings/logs, discussing hypoglycemia and hyperglycemia episodes and symptoms, medications doses, her options of short and long term treatment based on the latest standards of care / guidelines;  discussion about incorporating lifestyle medicine;  and documenting the  encounter. Risk reduction counseling performed per USPSTF guidelines to reduce cardiovascular risk factors.     Please refer to Patient Instructions for Blood Glucose Monitoring and Insulin/Medications Dosing Guide"  in media tab for additional information. Please  also refer to " Patient Self Inventory" in the Media  tab for reviewed elements of pertinent patient history.  Melissa Tucker participated in the discussions, expressed understanding, and voiced agreement with the above plans.  All questions were answered to her satisfaction. she is encouraged to contact clinic should she have any questions or concerns prior to her return visit.    Follow up plan: - Return in about 4 months (around 01/26/2023) for Bring Meter/CGM Device/Logs- A1c in Office.  Marquis Lunch, MD [facility] [address] [place], [place] [address] Phone: [phone]  Fax: [phone]    09/26/2022, 2:33 PM  This note was partially dictated with voice recognition software. Similar sounding words can be transcribed inadequately or may not  be corrected upon review.

## 2022-09-26 NOTE — Patient Instructions (Signed)

## 2022-09-27 ENCOUNTER — Telehealth: Payer: Self-pay

## 2022-09-27 NOTE — Telephone Encounter (Signed)
Tried to return a call to pt, did not receive an answer and was unable to leave a message. 

## 2022-10-04 DIAGNOSIS — E785 Hyperlipidemia, unspecified: Secondary | ICD-10-CM | POA: Diagnosis not present

## 2022-10-04 DIAGNOSIS — D649 Anemia, unspecified: Secondary | ICD-10-CM | POA: Diagnosis not present

## 2022-10-04 DIAGNOSIS — E1165 Type 2 diabetes mellitus with hyperglycemia: Secondary | ICD-10-CM | POA: Diagnosis not present

## 2022-10-12 ENCOUNTER — Other Ambulatory Visit: Payer: Self-pay | Admitting: Adult Health

## 2022-10-16 ENCOUNTER — Other Ambulatory Visit: Payer: Self-pay | Admitting: Adult Health

## 2022-10-24 ENCOUNTER — Other Ambulatory Visit: Payer: Self-pay | Admitting: "Endocrinology

## 2022-11-01 ENCOUNTER — Other Ambulatory Visit (HOSPITAL_COMMUNITY): Payer: Self-pay | Admitting: Family Medicine

## 2022-11-01 DIAGNOSIS — Z1231 Encounter for screening mammogram for malignant neoplasm of breast: Secondary | ICD-10-CM

## 2022-11-02 ENCOUNTER — Other Ambulatory Visit: Payer: Self-pay | Admitting: Adult Health

## 2022-11-07 ENCOUNTER — Other Ambulatory Visit: Payer: Self-pay | Admitting: "Endocrinology

## 2022-11-15 ENCOUNTER — Ambulatory Visit (HOSPITAL_COMMUNITY): Payer: 59

## 2022-11-20 ENCOUNTER — Other Ambulatory Visit: Payer: Self-pay | Admitting: Adult Health

## 2022-11-23 ENCOUNTER — Ambulatory Visit (HOSPITAL_COMMUNITY): Payer: 59

## 2022-11-29 ENCOUNTER — Other Ambulatory Visit: Payer: Self-pay | Admitting: "Endocrinology

## 2022-11-29 DIAGNOSIS — E1165 Type 2 diabetes mellitus with hyperglycemia: Secondary | ICD-10-CM

## 2022-11-30 ENCOUNTER — Other Ambulatory Visit: Payer: Self-pay | Admitting: "Endocrinology

## 2022-11-30 ENCOUNTER — Other Ambulatory Visit: Payer: Self-pay

## 2022-11-30 ENCOUNTER — Encounter (HOSPITAL_COMMUNITY): Payer: Self-pay | Admitting: *Deleted

## 2022-11-30 ENCOUNTER — Emergency Department (HOSPITAL_COMMUNITY)
Admission: EM | Admit: 2022-11-30 | Discharge: 2022-11-30 | Disposition: A | Discharge status: Home / Self Care | Payer: 59 | Source: Home / Self Care | Attending: Emergency Medicine | Admitting: Emergency Medicine

## 2022-11-30 DIAGNOSIS — R519 Headache, unspecified: Secondary | ICD-10-CM | POA: Diagnosis present

## 2022-11-30 DIAGNOSIS — Z7984 Long term (current) use of oral hypoglycemic drugs: Secondary | ICD-10-CM | POA: Diagnosis not present

## 2022-11-30 DIAGNOSIS — N3000 Acute cystitis without hematuria: Secondary | ICD-10-CM | POA: Diagnosis not present

## 2022-11-30 DIAGNOSIS — Z794 Long term (current) use of insulin: Secondary | ICD-10-CM | POA: Insufficient documentation

## 2022-11-30 DIAGNOSIS — I10 Essential (primary) hypertension: Secondary | ICD-10-CM | POA: Diagnosis not present

## 2022-11-30 DIAGNOSIS — Z91010 Allergy to peanuts: Secondary | ICD-10-CM | POA: Diagnosis not present

## 2022-11-30 DIAGNOSIS — Z79899 Other long term (current) drug therapy: Secondary | ICD-10-CM | POA: Insufficient documentation

## 2022-11-30 DIAGNOSIS — E119 Type 2 diabetes mellitus without complications: Secondary | ICD-10-CM | POA: Insufficient documentation

## 2022-11-30 DIAGNOSIS — E1165 Type 2 diabetes mellitus with hyperglycemia: Secondary | ICD-10-CM

## 2022-11-30 LAB — URINALYSIS, W/ REFLEX TO CULTURE (INFECTION SUSPECTED)
Bilirubin Urine: NEGATIVE
Glucose, UA: NEGATIVE mg/dL
Hgb urine dipstick: NEGATIVE
Ketones, ur: NEGATIVE mg/dL
Nitrite: NEGATIVE
Protein, ur: NEGATIVE mg/dL
Specific Gravity, Urine: 1.015 (ref 1.005–1.030)
pH: 6 (ref 5.0–8.0)

## 2022-11-30 LAB — CBG MONITORING, ED: Glucose-Capillary: 135 mg/dL — ABNORMAL HIGH (ref 70–99)

## 2022-11-30 MED ORDER — CEPHALEXIN 500 MG PO CAPS: 500.0000 mg | ORAL_CAPSULE | Freq: Four times a day (QID) | ORAL | 0 refills | Status: DC

## 2022-11-30 NOTE — Discharge Instructions (Addendum)
Your blood pressure has improved a lot while you are here.  For now I am not going to adjust your medicines.  Follow-up with her doctor as needed.

## 2022-11-30 NOTE — ED Triage Notes (Signed)
Pt reports complete relief of headache that lasted less than one hour after eating a banana. Pt endorses hx of HTN and compliance with BP medications. BP continues to improve with current BP 177/84. Denies any other symptom. No NV. No light.sound sensitivity. No CP/SOB.

## 2022-11-30 NOTE — ED Triage Notes (Signed)
Pt states she her BP was 200's/100's and she was c/o headache

## 2022-11-30 NOTE — ED Notes (Signed)
Pt ambulatory to bathroom and back to room with cane- steady gait noted.

## 2022-11-30 NOTE — ED Notes (Signed)
Discharge instructions provided by edp were reinforced to pt and daughter/caregiver at bedside. Pt was able to verbalize understanding with no additional questions at this time.

## 2022-12-01 NOTE — ED Provider Notes (Signed)
[facility] EMERGENCY DEPARTMENT AT [facility] Provider Note   CSN: [id number] Arrival date & time: 11/30/22  1839     History  Chief Complaint  Patient presents with   Headache    Melissa Tucker is a 72 y.o. female.   Headache Patient presented with headache and hypertension.  States check blood pressure at home when it was elevated.  Then went higher.  Does have a history of hypertension has been compliant with her medications.  No numbness weakness.  States she ate a banana and the headache is gone away.  However has had some urinary frequency.  No chest pain.  No trouble breathing.    Past Medical History:  Diagnosis Date   Anxiety and depression    Chest tightness    DEEP VENOUS THROMBOPHLEBITIS 10/05/2009   Qualifier: History of  By: Dietrich Pates, MD, Javier Docker    Diabetes mellitus 1994   No insulin   DJD (degenerative joint disease)    History of DVT (deep vein thrombosis) 1989   Following childbirth   Hyperlipidemia    Hypertension    IBS (irritable bowel syndrome)    Rotator cuff syndrome of right shoulder 08/22/2011    Home Medications Prior to Admission medications   Medication Sig Start Date End Date Taking? Authorizing Provider  cephALEXin (KEFLEX) 500 MG capsule Take 1 capsule (500 mg total) by mouth 4 (four) times daily. 11/30/22  Yes Benjiman Core, MD  ALPRAZolam Prudy Feeler) 0.5 MG tablet TAKE 1 TABLET BY MOUTH TWICE DAILY AS NEEDED FOR ANXIETY 04/18/21   Anabel Halon, MD  amLODipine (NORVASC) 5 MG tablet TAKE 1 TABLET BY MOUTH ONCE A DAY. Patient taking differently: Take 2 tablets by mouth daily. 02/07/21   Kerri Perches, MD  bisacodyl (DULCOLAX) 5 MG EC tablet Take 5 mg by mouth as needed for moderate constipation.    [provider]  Blood Glucose Monitoring Suppl (ACCU-CHEK GUIDE) w/Device KIT USE TO TEST BLOOD GLUCOSE TWICE DAILY AS DIRECTED. 07/30/20   Roma Kayser, MD  Cholecalciferol (VITAMIN D-3) 125 MCG (5000 UT)  TABS Take 5,000 Units by mouth in the morning.    [provider]  clobetasol cream (TEMOVATE) 0.05 % APPLY TO AFFECTED AREA TWICE DAILY FOR 2 WEEKS; THEN 2 TO 3 TIMES PER WEEK. 11/20/22   Adline Potter, NP  colesevelam [facility]) 625 MG tablet Take 1,875 mg by mouth 2 (two) times daily as needed (irritable bowel syndrome). 02/06/19   [provider]  cyanocobalamin (VITAMIN B12) 1000 MCG tablet Take by mouth.    [provider]  dicyclomine (BENTYL) 10 MG capsule Take 10 mg by mouth daily as needed (IBS).    [provider]  Dulaglutide (TRULICITY) 4.5 MG/0.5ML SOPN INJECT 4.5 MG INTO THE SKIN WEEKLY. 11/07/22   Roma Kayser, MD  escitalopram (LEXAPRO) 20 MG tablet TAKE ONE TABLET BY MOUTH ONCE DAILY. Patient taking differently: 10 mg. 11/15/20   Heather Roberts, NP  fluconazole (DIFLUCAN) 150 MG tablet TAKE 1 TABLET BY MOUTH NOW, AND 1 TABLET IN 3 DAYS. 11/02/22   Adline Potter, NP  FLUoxetine (PROZAC) 20 MG capsule Take 20 mg by mouth daily.    [provider]  glipiZIDE (GLUCOTROL XL) 10 MG 24 hr tablet TAKE 1 TABLET BY MOUTH ONCE A DAY. 11/29/22   Roma Kayser, MD  glucose blood (ACCU-CHEK GUIDE) test strip USE TO CHECK BLOOD GLUCOSE TWICE DAILY AS DIRECTED. 10/24/22  Roma Kayser, MD  HYDROcodone-acetaminophen (NORCO) 10-325 MG tablet TAKE 1 TABLET BY MOUTH FOUR TIMES DAILY AS NEEDED. 04/18/21   Anabel Halon, MD  lisinopril (ZESTRIL) 20 MG tablet Take 20 mg by mouth daily. 01/03/22   [provider]  metFORMIN (GLUCOPHAGE) 1000 MG tablet TAKE ONE TABLET BY MOUTH TWICE A DAY WITH A MEAL 11/30/22   Nida, Denman George, MD  metoCLOPramide (REGLAN) 5 MG tablet Take 5 mg by mouth as needed for nausea.    [provider]  Multiple Vitamin (MULTIVITAMIN WITH MINERALS) TABS tablet Take 1 tablet by mouth daily. One A Day for Women    [provider]  simvastatin (ZOCOR) 40 MG tablet Take 1 tablet (40  mg total) by mouth daily. 04/18/21   Anabel Halon, MD      Allergies    Peanut-containing drug products    Review of Systems   Review of Systems  Neurological:  Positive for headaches.    Physical Exam Updated Vital Signs BP (!) 160/76   Pulse 91   Temp 98.4 F (36.9 C) (Oral)   Resp 15   Ht 5\' 9"  (1.753 m)   Wt 80.3 kg   SpO2 95%   BMI 26.14 kg/m  Physical Exam Vitals and nursing note reviewed.  Cardiovascular:     Rate and Rhythm: Regular rhythm.  Pulmonary:     Breath sounds: No wheezing.  Skin:    General: Skin is warm.  Neurological:     Mental Status: She is alert.  Psychiatric:        Mood and Affect: Mood normal.     ED Results / Procedures / Treatments   Labs (all labs ordered are listed, but only abnormal results are displayed) Labs Reviewed  URINALYSIS, W/ REFLEX TO CULTURE (INFECTION SUSPECTED) - Abnormal; Notable for the following components:      Result Value   APPearance HAZY (*)    Leukocytes,Ua MODERATE (*)    Bacteria, UA RARE (*)    All other components within normal limits  CBG MONITORING, ED - Abnormal; Notable for the following components:   Glucose-Capillary 135 (*)    All other components within normal limits  URINE CULTURE    EKG EKG Interpretation Date/Time:  Thursday November 30 2022 19:17:34 EDT Ventricular Rate:  113 PR Interval:  169 QRS Duration:  133 QT Interval:  334 QTC Calculation: 458 R Axis:   -30  Text Interpretation: Sinus tachycardia Left bundle branch block Baseline wander in lead(s) V1 HEART RATE INCREASED SINCE Confirmed by Benjiman Core [phone]) on 11/30/2022 8:22:38 PM  Radiology No results found.  Procedures Procedures    Medications Ordered in ED Medications - No data to display  ED Course/ Medical Decision Making/ A&P                                 Medical Decision Making Amount and/or Complexity of Data Reviewed Labs: ordered.  Risk Prescription drug management.   Patient with  hypertension.  Has had urinary frequency.  Is diabetic but sugars only mildly elevated.  States she did not take her metformin this morning.  Hypertension but does not appear to have endorgan damage at this time.  Does apparently have UTI.  Culture sent.  Will treat.  Reviewed recent blood work from couple months ago.  Do not think we need to repeat today.  Blood pressures come down on her  own.  Can follow-up short-term.  Appears stable for discharge home.        Final Clinical Impression(s) / ED Diagnoses Final diagnoses:  Hypertension, unspecified type  Acute cystitis without hematuria    Rx / DC Orders ED Discharge Orders          Ordered    cephALEXin (KEFLEX) 500 MG capsule  4 times daily        11/30/22 2238              Benjiman Core, MD 12/01/22 1454

## 2022-12-02 LAB — URINE CULTURE: Culture: <10000 — AB

## 2022-12-05 DIAGNOSIS — N390 Urinary tract infection, site not specified: Secondary | ICD-10-CM | POA: Diagnosis not present

## 2022-12-05 DIAGNOSIS — I10 Essential (primary) hypertension: Secondary | ICD-10-CM | POA: Diagnosis not present

## 2022-12-05 DIAGNOSIS — Z713 Dietary counseling and surveillance: Secondary | ICD-10-CM | POA: Diagnosis not present

## 2022-12-18 ENCOUNTER — Encounter (HOSPITAL_COMMUNITY): Payer: Self-pay

## 2022-12-18 ENCOUNTER — Ambulatory Visit (HOSPITAL_COMMUNITY)
Admission: RE | Admit: 2022-12-18 | Discharge: 2022-12-18 | Disposition: A | Discharge status: Home / Self Care | Payer: 59 | Source: Ambulatory Visit | Attending: Family Medicine | Admitting: Family Medicine

## 2022-12-18 DIAGNOSIS — Z1231 Encounter for screening mammogram for malignant neoplasm of breast: Secondary | ICD-10-CM | POA: Insufficient documentation

## 2022-12-27 ENCOUNTER — Other Ambulatory Visit: Payer: Self-pay | Admitting: Adult Health

## 2022-12-27 ENCOUNTER — Other Ambulatory Visit: Payer: Self-pay

## 2022-12-27 DIAGNOSIS — E1165 Type 2 diabetes mellitus with hyperglycemia: Secondary | ICD-10-CM

## 2022-12-27 MED ORDER — FLUCONAZOLE 150 MG PO TABS: ORAL_TABLET | ORAL | 1 refills | Status: DC

## 2022-12-27 MED ORDER — GLIPIZIDE ER 10 MG PO TB24: 10.0000 mg | ORAL_TABLET | Freq: Every day | ORAL | 1 refills | Status: DC

## 2022-12-27 NOTE — Progress Notes (Signed)
Refilled diflucan

## 2023-01-04 DIAGNOSIS — G894 Chronic pain syndrome: Secondary | ICD-10-CM | POA: Diagnosis not present

## 2023-01-04 DIAGNOSIS — Z79899 Other long term (current) drug therapy: Secondary | ICD-10-CM | POA: Diagnosis not present

## 2023-01-04 DIAGNOSIS — M48061 Spinal stenosis, lumbar region without neurogenic claudication: Secondary | ICD-10-CM | POA: Diagnosis not present

## 2023-01-04 DIAGNOSIS — J019 Acute sinusitis, unspecified: Secondary | ICD-10-CM | POA: Diagnosis not present

## 2023-01-23 ENCOUNTER — Other Ambulatory Visit: Payer: Self-pay | Admitting: "Endocrinology

## 2023-01-30 ENCOUNTER — Ambulatory Visit (INDEPENDENT_AMBULATORY_CARE_PROVIDER_SITE_OTHER): Payer: 59 | Admitting: "Endocrinology

## 2023-01-30 ENCOUNTER — Encounter: Payer: Self-pay | Admitting: "Endocrinology

## 2023-01-30 VITALS — BP 134/62 | HR 88 | Ht 69.0 in | Wt 173.8 lb

## 2023-01-30 DIAGNOSIS — E782 Mixed hyperlipidemia: Secondary | ICD-10-CM

## 2023-01-30 DIAGNOSIS — I10 Essential (primary) hypertension: Secondary | ICD-10-CM

## 2023-01-30 DIAGNOSIS — E1165 Type 2 diabetes mellitus with hyperglycemia: Secondary | ICD-10-CM | POA: Diagnosis not present

## 2023-01-30 DIAGNOSIS — Z7985 Long-term (current) use of injectable non-insulin antidiabetic drugs: Secondary | ICD-10-CM

## 2023-01-30 LAB — POCT GLYCOSYLATED HEMOGLOBIN (HGB A1C): HbA1c, POC (controlled diabetic range): 5.9 % (ref 0.0–7.0)

## 2023-01-30 MED ORDER — TRULICITY 3 MG/0.5ML ~~LOC~~ SOAJ: 3.0000 mg | SUBCUTANEOUS | 1 refills | Status: DC

## 2023-01-30 NOTE — Patient Instructions (Signed)

## 2023-01-30 NOTE — Progress Notes (Signed)
01/30/2023, 1:21 PM        Endocrinology follow-up note   Subjective:    Patient ID: Melissa Tucker, female    DOB: July 09, 1950.  SHAHINA CAVALLO is seen in follow-up for management of currently uncontrolled symptomatic diabetes, hyperlipidemia, hypertension requested by  Lupita Raider, NP.   Past Medical History:  Diagnosis Date   Anxiety and depression    Chest tightness    DEEP VENOUS THROMBOPHLEBITIS 10/05/2009   Qualifier: History of  By: Dietrich Pates, MD, Javier Docker    Diabetes mellitus 1994   No insulin   DJD (degenerative joint disease)    History of DVT (deep vein thrombosis) 1989   Following childbirth   Hyperlipidemia    Hypertension    IBS (irritable bowel syndrome)    Rotator cuff syndrome of right shoulder 08/22/2011   Past Surgical History:  Procedure Laterality Date   ABDOMINAL HYSTERECTOMY     CESAREAN SECTION  1988   CHOLECYSTECTOMY     COLONOSCOPY  2005   Normal findings   COLONOSCOPY WITH PROPOFOL N/A 11/03/2020   Procedure: COLONOSCOPY WITH PROPOFOL;  Surgeon: Dolores Frame, MD;  Location: [facility];  Service: Gastroenterology;  Laterality: N/A;  8:30   JOINT REPLACEMENT N/A    Phreesia 02/28/2020   ORIF ANKLE FRACTURE Right 05/16/2019   Procedure: OPEN REDUCTION INTERNAL FIXATION (ORIF) ANKLE FRACTURE;  Surgeon: Roby Lofts, MD;  Location: [facility];  Service: Orthopedics;  Laterality: Right;   ORIF FEMUR FRACTURE Right 05/16/2019   Procedure: OPEN REDUCTION INTERNAL FIXATION (ORIF) DISTAL FEMUR FRACTURE;  Surgeon: Roby Lofts, MD;  Location: [facility] OR;  Service: Orthopedics;  Laterality: Right;   SHOULDER SURGERY     Left shoulder manipulation with subcrominal decompression, capsulitis   TOTAL KNEE ARTHROPLASTY  2003   Left   TOTAL KNEE ARTHROPLASTY  2007   Right   Social History   Socioeconomic History   Marital status: Divorced    Spouse name: Not on file   Number of children: 1   Years of education:  88   Highest education level: 12th grade  Occupational History   Occupation: disabled    Associate Professor: UNEMPLOYED  Tobacco Use   Smoking status: Former    Current packs/day: 0.00    Types: Cigarettes    Quit date: 04/10/1988    Years since quitting: 34.8    Passive exposure: Past   Smokeless tobacco: Former   Tobacco comments:    Minimal prior use  Vaping Use   Vaping status: Never Used  Substance and Sexual Activity   Alcohol use: No   Drug use: No   Sexual activity: Not Currently    Partners: Male    Birth control/protection: Surgical    Comment: hyst  Other Topics Concern   Not on file  Social History Narrative   Divorced   Resides with daughter, also has 2 sons   No regular exercise   Social Determinants of Health   Financial Resource Strain: Low Risk  (12/26/2021)   Overall Financial Resource Strain (CARDIA)    Difficulty of Paying Living Expenses: Not hard at all  Food Insecurity: No Food Insecurity (12/26/2021)   Hunger Vital Sign    Worried About Running Out of Food in the Last Year: Never true    Ran Out of Food in the Last Year: Never true  Transportation Needs: No Transportation Needs (12/26/2021)   PRAPARE - Transportation  Lack of Transportation (Medical): No    Lack of Transportation (Non-Medical): No  Physical Activity: Inactive (12/26/2021)   Exercise Vital Sign    Days of Exercise per Week: 0 days    Minutes of Exercise per Session: 0 min  Stress: No Stress Concern Present (12/26/2021)   [employer] of Occupational Health - Occupational Stress Questionnaire    Feeling of Stress : Not at all  Social Connections: Moderately Isolated (12/26/2021)   Social Connection and Isolation Panel [NHANES]    Frequency of Communication with Friends and Family: More than three times a week    Frequency of Social Gatherings with Friends and Family: More than three times a week    Attends Religious Services: More than 4 times per year    Active Member of [employer] or  Organizations: No    Attends Banker Meetings: Never    Marital Status: Divorced   Outpatient Encounter Medications as of 01/30/2023  Medication Sig   Dulaglutide (TRULICITY) 3 MG/0.5ML SOAJ Inject 3 mg as directed once a week.   ALPRAZolam (XANAX) 0.5 MG tablet TAKE 1 TABLET BY MOUTH TWICE DAILY AS NEEDED FOR ANXIETY   amLODipine (NORVASC) 5 MG tablet TAKE 1 TABLET BY MOUTH ONCE A DAY. (Patient taking differently: Take 2 tablets by mouth daily.)   bisacodyl (DULCOLAX) 5 MG EC tablet Take 5 mg by mouth as needed for moderate constipation.   Blood Glucose Monitoring Suppl (ACCU-CHEK GUIDE) w/Device KIT USE TO TEST BLOOD GLUCOSE TWICE DAILY AS DIRECTED.   cephALEXin (KEFLEX) 500 MG capsule Take 1 capsule (500 mg total) by mouth 4 (four) times daily.   Cholecalciferol (VITAMIN D-3) 125 MCG (5000 UT) TABS Take 5,000 Units by mouth in the morning.   clobetasol cream (TEMOVATE) 0.05 % APPLY TO AFFECTED AREA TWICE DAILY FOR 2 WEEKS; THEN 2 TO 3 TIMES PER WEEK.   colesevelam (WELCHOL) 625 MG tablet Take 1,875 mg by mouth 2 (two) times daily as needed (irritable bowel syndrome).   cyanocobalamin (VITAMIN B12) 1000 MCG tablet Take by mouth.   dicyclomine (BENTYL) 10 MG capsule Take 10 mg by mouth daily as needed (IBS).   escitalopram (LEXAPRO) 20 MG tablet TAKE ONE TABLET BY MOUTH ONCE DAILY. (Patient taking differently: 10 mg.)   fluconazole (DIFLUCAN) 150 MG tablet TAKE 1 TABLET BY MOUTH NOW, AND 1 TABLET IN 3 DAYS. Do no take zocor when taking   FLUoxetine (PROZAC) 20 MG capsule Take 20 mg by mouth daily.   glucose blood (ACCU-CHEK GUIDE) test strip USE TO CHECK BLOOD GLUCOSE TWICE DAILY AS DIRECTED.   HYDROcodone-acetaminophen (NORCO) 10-325 MG tablet TAKE 1 TABLET BY MOUTH FOUR TIMES DAILY AS NEEDED.   lisinopril (ZESTRIL) 20 MG tablet Take 20 mg by mouth daily.   metFORMIN (GLUCOPHAGE) 1000 MG tablet TAKE ONE TABLET BY MOUTH TWICE A DAY WITH A MEAL   metoCLOPramide (REGLAN) 5 MG  tablet Take 5 mg by mouth as needed for nausea.   Multiple Vitamin (MULTIVITAMIN WITH MINERALS) TABS tablet Take 1 tablet by mouth daily. One A Day for Women   simvastatin (ZOCOR) 40 MG tablet Take 1 tablet (40 mg total) by mouth daily.   [DISCONTINUED] glipiZIDE (GLUCOTROL XL) 10 MG 24 hr tablet Take 1 tablet (10 mg total) by mouth daily.   [DISCONTINUED] TRULICITY 4.5 MG/0.5ML SOPN INJECT 4.5 MG INTO THE SKIN WEEKLY.   No facility-administered encounter medications on file as of 01/30/2023.    ALLERGIES: Allergies  Allergen Reactions   Peanut-Containing  Drug Products Swelling    VACCINATION STATUS: Immunization History  Administered Date(s) Administered   Fluad Quad(high Dose 65+) 03/09/2020, 04/25/2021   PFIZER(Purple Top)SARS-COV-2 Vaccination 06/18/2019, 07/09/2019   PPD Test 12/17/2020   Pneumococcal Conjugate-13 03/09/2020    Diabetes She presents for her follow-up diabetic visit. She has type 2 diabetes mellitus. Onset time: She was diagnosed at approximate age of 41 years. Her disease course has been improving. There are no hypoglycemic associated symptoms. Pertinent negatives for hypoglycemia include no confusion, headaches, pallor or seizures. Pertinent negatives for diabetes include no blurred vision, no chest pain, no fatigue, no polydipsia, no polyphagia and no polyuria. There are no hypoglycemic complications. Symptoms are improving. Diabetic complications include retinopathy. Risk factors for coronary artery disease include diabetes mellitus, dyslipidemia, family history, hypertension, obesity, sedentary lifestyle, post-menopausal and tobacco exposure. Current diabetic treatments: She is currently on Invokana 300 mg p.o. daily metformin/glipizide,\ She is compliant with treatment some of the time. Her weight is decreasing steadily (She has lost 35 pounds overall since last year.). She is following a generally unhealthy diet. When asked about meal planning, she reported none.  She has not had a previous visit with a dietitian. She never participates in exercise. Her home blood glucose trend is decreasing steadily. Her breakfast blood glucose range is generally 110-130 mg/dl. Her bedtime blood glucose range is generally 130-140 mg/dl. Her overall blood glucose range is 130-140 mg/dl. (She presents with her showing controlled glycemic profile averaging 125-145 over the last 30 days.  Her point-of-care A1c is 5.9% improving from 6.6% during her last visit.  She did not document hypoglycemia.    ) An ACE inhibitor/angiotensin II receptor blocker is being taken. Eye exam is current.  Hyperlipidemia This is a chronic problem. The current episode started more than 1 year ago. The problem is controlled. Exacerbating diseases include diabetes and obesity. Pertinent negatives include no chest pain, myalgias or shortness of breath. Current antihyperlipidemic treatment includes statins and ezetimibe. Risk factors for coronary artery disease include dyslipidemia, diabetes mellitus, family history, obesity, hypertension, a sedentary lifestyle and post-menopausal.  Hypertension This is a chronic problem. The current episode started more than 1 year ago. The problem is uncontrolled. Pertinent negatives include no blurred vision, chest pain, headaches, palpitations or shortness of breath. Risk factors for coronary artery disease include diabetes mellitus, dyslipidemia, family history, obesity, post-menopausal state, sedentary lifestyle and smoking/tobacco exposure. Past treatments include ACE inhibitors. Hypertensive end-organ damage includes retinopathy.    Review of systems Limited as above.  Objective:    BP 134/62   Pulse 88   Ht 5\' 9"  (1.753 m)   Wt 173 lb 12.8 oz (78.8 kg)   BMI 25.67 kg/m   Wt Readings from Last 3 Encounters:  01/30/23 173 lb 12.8 oz (78.8 kg)  11/30/22 177 lb (80.3 kg)  09/26/22 184 lb 9.6 oz (83.7 kg)      Physical Exam- Limited  CMP     Component  Value Date/Time   NA 139 09/21/2022 1052   K 4.5 09/21/2022 1052   CL 99 09/21/2022 1052   CO2 26 09/21/2022 1052   GLUCOSE 147 (H) 09/21/2022 1052   GLUCOSE 281 (H) 05/15/2019 1505   BUN 11 09/21/2022 1052   CREATININE 0.69 09/21/2022 1052   CREATININE 0.64 08/14/2018 1142   CALCIUM 11.1 (H) 09/21/2022 1052   PROT 7.1 09/21/2022 1052   ALBUMIN 4.8 09/21/2022 1052   AST 25 09/21/2022 1052   ALT 25 09/21/2022 1052   ALKPHOS  107 09/21/2022 1052   BILITOT 0.6 09/21/2022 1052   GFRNONAA 99 03/09/2020 1400   GFRNONAA 92 08/14/2018 1142   GFRAA 114 03/09/2020 1400   GFRAA 107 08/14/2018 1142     Diabetic Labs (most recent): Lab Results  Component Value Date   HGBA1C 5.9 01/30/2023   HGBA1C 6.6 09/26/2022   HGBA1C 9.6 (A) 05/31/2022     Lipid Panel ( most recent) Lipid Panel     Component Value Date/Time   CHOL 146 09/21/2022 1052   TRIG 88 09/21/2022 1052   HDL 57 09/21/2022 1052   CHOLHDL 2.6 09/21/2022 1052   LDLCALC 72 09/21/2022 1052     Assessment & Plan:   1. Uncontrolled type 2 diabetes mellitus with hyperglycemia (HCC)  - HALIMO DENKE has currently uncontrolled symptomatic type 2 DM since 72 years of age.  She presents with her showing controlled glycemic profile averaging 125-145 over the last 30 days.  Her point-of-care A1c is 5.9% improving from 6.6% during her last visit.  She did not document hypoglycemia.    -her diabetes is complicated by retinopathy, obesity/sedentary life, history of smoking and ARIBELLA NAPLES remains at a high risk for more acute and chronic complications which include CAD, CVA, CKD, retinopathy, and neuropathy. These are all discussed in detail with the patient.  - I have counseled her on diet management and weight loss, by adopting a carbohydrate restricted/protein rich diet.  - she acknowledges that there is a room for improvement in her food and drink choices. - Suggestion is made for her to avoid simple carbohydrates  from her  diet including Cakes, Sweet Desserts, Ice Cream, Soda (diet and regular), Sweet Tea, Candies, Chips, Cookies, Store Bought Juices, Alcohol , Artificial Sweeteners,  Coffee Creamer, and "Sugar-free" Products, Lemonade. This will help patient to have more stable blood glucose profile and potentially avoid unintended weight gain.  The following Lifestyle Medicine recommendations according to American College of Lifestyle Medicine  [facility]) were discussed and and offered to patient and she  agrees to start the journey:  A. Whole Foods, Plant-Based Nutrition comprising of fruits and vegetables, plant-based proteins, whole-grain carbohydrates was discussed in detail with the patient.   A list for source of those nutrients were also provided to the patient.  Patient will use only water or unsweetened tea for hydration. B.  The need to stay away from risky substances including alcohol, smoking; obtaining 7 to 9 hours of restorative sleep, at least 150 minutes of moderate intensity exercise weekly, the importance of healthy social connections,  and stress management techniques were discussed. C.  A full color page of  Calorie density of various food groups per pound showing examples of each food groups was provided to the patient.   - I encouraged her to switch to  unprocessed or minimally processed complex starch and increased protein intake (animal or plant source), fruits, and vegetables.  - she is advised to stick to a routine mealtimes to eat 3 meals  a day and avoid unnecessary snacks ( to snack only to correct hypoglycemia).   - I have approached her with the following individualized plan to manage diabetes and patient agrees:   -In light of her presentation with tight glycemic profile, and significant weight loss of 35 pounds over the year, she was advised to lower her Trulicity to 3 mg subcutaneously weekly.  She is advised to discontinue glipizide at this point.  She will continue to benefit from  metformin 1000  mg p.o. twice daily.    -She is advised to continue monitoring blood glucose twice a day-daily before breakfast and at bedtime.  -She is encouraged to call clinic for blood glucose readings less than 70 or greater than 200x3.     2) BP/HTN:  -Her blood pressure is controlled currently.  She is advised to continue her current medications including lisinopril 40 mg p.o. daily, Lasix 40 mg p.o. daily . She will be considered for additional treatment with hydrochlorothiazide on her next visit.   3) Lipids/HPL: Her recent lipid panel showed LDL controlled to target at 72.  She is advised to continue lovastatin 40 mg p.o. nightly.    Side effects and precautions discussed with her.    4)  Weight/Diet: Her BMI 25.67-she has achieved 35 pounds weight loss since last year.  This is considered adequate weight loss so far, not a candidate for any more major weight loss.    CDE Consult  has been  initiated , exercise, and detailed carbohydrates information provided.  5) Chronic Care/Health Maintenance:  -she  is on ACEI and Statin medications and  is encouraged to initiate and continue to follow up with Ophthalmology, Dentist,  Podiatrist at least yearly or according to recommendations, and advised to  stay away from smoking. I have recommended yearly flu vaccine and pneumonia vaccine at least every 5 years; moderate intensity exercise for up to 150 minutes weekly; and  sleep for at least 7 hours a day.  She did have normal screening ABI on May 11, 2020.  This study will be repeated in February 2027, or sooner if needed.  - I advised patient to maintain close follow up with Lupita Raider, NP for primary care needs.   I spent  26  minutes in the care of the patient today including review of labs from CMP, Lipids, Thyroid Function, Hematology (current and previous including abstractions from other facilities); face-to-face time discussing  her blood glucose readings/logs, discussing  hypoglycemia and hyperglycemia episodes and symptoms, medications doses, her options of short and long term treatment based on the latest standards of care / guidelines;  discussion about incorporating lifestyle medicine;  and documenting the encounter. Risk reduction counseling performed per USPSTF guidelines to reduce cardiovascular risk factors.     Please refer to Patient Instructions for Blood Glucose Monitoring and Insulin/Medications Dosing Guide"  in media tab for additional information. Please  also refer to " Patient Self Inventory" in the Media  tab for reviewed elements of pertinent patient history.  Ricardo Jericho participated in the discussions, expressed understanding, and voiced agreement with the above plans.  All questions were answered to her satisfaction. she is encouraged to contact clinic should she have any questions or concerns prior to her return visit.  Follow up plan: - Return in about 4 months (around 06/02/2023) for F/U with Pre-visit Labs, Meter/CGM/Logs, A1c here.  Marquis Lunch, MD [facility] [address] [place], [place] [address] Phone: [phone]  Fax: [phone]    01/30/2023, 1:21 PM  This note was partially dictated with voice recognition software. Similar sounding words can be transcribed inadequately or may not  be corrected upon review.

## 2023-02-15 ENCOUNTER — Other Ambulatory Visit: Payer: Self-pay | Admitting: "Endocrinology

## 2023-02-15 DIAGNOSIS — E1165 Type 2 diabetes mellitus with hyperglycemia: Secondary | ICD-10-CM

## 2023-02-19 ENCOUNTER — Other Ambulatory Visit: Payer: Self-pay | Admitting: Adult Health

## 2023-03-04 ENCOUNTER — Other Ambulatory Visit: Payer: Self-pay | Admitting: "Endocrinology

## 2023-03-04 DIAGNOSIS — E1165 Type 2 diabetes mellitus with hyperglycemia: Secondary | ICD-10-CM

## 2023-03-20 ENCOUNTER — Other Ambulatory Visit: Payer: Self-pay | Admitting: "Endocrinology

## 2023-04-19 DIAGNOSIS — E1165 Type 2 diabetes mellitus with hyperglycemia: Secondary | ICD-10-CM | POA: Diagnosis not present

## 2023-04-19 DIAGNOSIS — E785 Hyperlipidemia, unspecified: Secondary | ICD-10-CM | POA: Diagnosis not present

## 2023-04-27 DIAGNOSIS — G4700 Insomnia, unspecified: Secondary | ICD-10-CM | POA: Diagnosis not present

## 2023-04-27 DIAGNOSIS — I700 Atherosclerosis of aorta: Secondary | ICD-10-CM | POA: Diagnosis not present

## 2023-04-27 DIAGNOSIS — R809 Proteinuria, unspecified: Secondary | ICD-10-CM | POA: Diagnosis not present

## 2023-04-27 DIAGNOSIS — E785 Hyperlipidemia, unspecified: Secondary | ICD-10-CM | POA: Diagnosis not present

## 2023-04-27 DIAGNOSIS — D649 Anemia, unspecified: Secondary | ICD-10-CM | POA: Diagnosis not present

## 2023-04-27 DIAGNOSIS — G894 Chronic pain syndrome: Secondary | ICD-10-CM | POA: Diagnosis not present

## 2023-04-27 DIAGNOSIS — R112 Nausea with vomiting, unspecified: Secondary | ICD-10-CM | POA: Diagnosis not present

## 2023-04-27 DIAGNOSIS — E1165 Type 2 diabetes mellitus with hyperglycemia: Secondary | ICD-10-CM | POA: Diagnosis not present

## 2023-04-27 DIAGNOSIS — I10 Essential (primary) hypertension: Secondary | ICD-10-CM | POA: Diagnosis not present

## 2023-04-27 DIAGNOSIS — Z23 Encounter for immunization: Secondary | ICD-10-CM | POA: Diagnosis not present

## 2023-04-27 DIAGNOSIS — R350 Frequency of micturition: Secondary | ICD-10-CM | POA: Diagnosis not present

## 2023-05-30 ENCOUNTER — Other Ambulatory Visit: Payer: Self-pay | Admitting: "Endocrinology

## 2023-05-30 DIAGNOSIS — E1165 Type 2 diabetes mellitus with hyperglycemia: Secondary | ICD-10-CM

## 2023-06-04 DIAGNOSIS — E1165 Type 2 diabetes mellitus with hyperglycemia: Secondary | ICD-10-CM | POA: Diagnosis not present

## 2023-06-05 LAB — COMPREHENSIVE METABOLIC PANEL
ALT: 21 [IU]/L (ref 0–32)
AST: 23 [IU]/L (ref 0–40)
Albumin: 4.7 g/dL (ref 3.8–4.8)
Alkaline Phosphatase: 105 [IU]/L (ref 44–121)
BUN/Creatinine Ratio: 31 — ABNORMAL HIGH (ref 12–28)
BUN: 17 mg/dL (ref 8–27)
Bilirubin Total: 0.4 mg/dL (ref 0.0–1.2)
CO2: 27 mmol/L (ref 20–29)
Calcium: 10.4 mg/dL — ABNORMAL HIGH (ref 8.7–10.3)
Chloride: 98 mmol/L (ref 96–106)
Creatinine, Ser: 0.54 mg/dL — ABNORMAL LOW (ref 0.57–1.00)
Globulin, Total: 2.2 g/dL (ref 1.5–4.5)
Glucose: 116 mg/dL — ABNORMAL HIGH (ref 70–99)
Potassium: 4 mmol/L (ref 3.5–5.2)
Sodium: 140 mmol/L (ref 134–144)
Total Protein: 6.9 g/dL (ref 6.0–8.5)
eGFR: 98 mL/min/{1.73_m2} (ref >59)

## 2023-06-05 LAB — LIPID PANEL
Chol/HDL Ratio: 2 {ratio} (ref 0.0–4.4)
Cholesterol, Total: 129 mg/dL (ref 100–199)
HDL: 63 mg/dL (ref >39)
LDL Chol Calc (NIH): 50 mg/dL (ref 0–99)
Triglycerides: 83 mg/dL (ref 0–149)
VLDL Cholesterol Cal: 16 mg/dL (ref 5–40)

## 2023-06-06 ENCOUNTER — Ambulatory Visit: Payer: 59 | Admitting: "Endocrinology

## 2023-06-18 ENCOUNTER — Other Ambulatory Visit: Payer: Self-pay

## 2023-06-18 DIAGNOSIS — E1165 Type 2 diabetes mellitus with hyperglycemia: Secondary | ICD-10-CM

## 2023-06-18 MED ORDER — ACCU-CHEK GUIDE TEST VI STRP: ORAL_STRIP | 1 refills | Status: DC

## 2023-06-25 ENCOUNTER — Encounter: Payer: Self-pay | Admitting: "Endocrinology

## 2023-06-25 ENCOUNTER — Ambulatory Visit (INDEPENDENT_AMBULATORY_CARE_PROVIDER_SITE_OTHER): Payer: 59 | Admitting: "Endocrinology

## 2023-06-25 VITALS — BP 108/64 | HR 76 | Ht 69.0 in | Wt 174.4 lb

## 2023-06-25 DIAGNOSIS — Z7985 Long-term (current) use of injectable non-insulin antidiabetic drugs: Secondary | ICD-10-CM | POA: Diagnosis not present

## 2023-06-25 DIAGNOSIS — E782 Mixed hyperlipidemia: Secondary | ICD-10-CM

## 2023-06-25 DIAGNOSIS — E1165 Type 2 diabetes mellitus with hyperglycemia: Secondary | ICD-10-CM

## 2023-06-25 DIAGNOSIS — I10 Essential (primary) hypertension: Secondary | ICD-10-CM

## 2023-06-25 LAB — POCT GLYCOSYLATED HEMOGLOBIN (HGB A1C): HbA1c, POC (controlled diabetic range): 6.1 % (ref 0.0–7.0)

## 2023-06-25 NOTE — Patient Instructions (Signed)

## 2023-06-25 NOTE — Progress Notes (Signed)
 06/25/2023, 2:28 PM        Endocrinology follow-up note   Subjective:    Patient ID: Melissa Tucker, female    DOB: [birth year].  Melissa Tucker is seen in follow-up for management of currently controlled asymptomatic diabetes, hyperlipidemia, hypertension requested by  Lupita Raider, NP.   Past Medical History:  Diagnosis Date   Anxiety and depression    Chest tightness    DEEP VENOUS THROMBOPHLEBITIS 10/05/2009   Qualifier: History of  By: Dietrich Pates, MD, Javier Docker    Diabetes mellitus 1994   No insulin   DJD (degenerative joint disease)    History of DVT (deep vein thrombosis) 1989   Following childbirth   Hyperlipidemia    Hypertension    IBS (irritable bowel syndrome)    Rotator cuff syndrome of right shoulder 08/22/2011   Past Surgical History:  Procedure Laterality Date   ABDOMINAL HYSTERECTOMY     CESAREAN SECTION  1988   CHOLECYSTECTOMY     COLONOSCOPY  2005   Normal findings   COLONOSCOPY WITH PROPOFOL N/A 11/03/2020   Procedure: COLONOSCOPY WITH PROPOFOL;  Surgeon: Dolores Frame, MD;  Location: [facility];  Service: Gastroenterology;  Laterality: N/A;  8:30   JOINT REPLACEMENT N/A    Phreesia 02/28/2020   ORIF ANKLE FRACTURE Right 05/16/2019   Procedure: OPEN REDUCTION INTERNAL FIXATION (ORIF) ANKLE FRACTURE;  Surgeon: Roby Lofts, MD;  Location: [facility];  Service: Orthopedics;  Laterality: Right;   ORIF FEMUR FRACTURE Right 05/16/2019   Procedure: OPEN REDUCTION INTERNAL FIXATION (ORIF) DISTAL FEMUR FRACTURE;  Surgeon: Roby Lofts, MD;  Location: [facility] OR;  Service: Orthopedics;  Laterality: Right;   SHOULDER SURGERY     Left shoulder manipulation with subcrominal decompression, capsulitis   TOTAL KNEE ARTHROPLASTY  2003   Left   TOTAL KNEE ARTHROPLASTY  2007   Right   Social History   Socioeconomic History   Marital status: Divorced    Spouse name: Not on file   Number of children: 1   Years of education:  54   Highest education level: 12th grade  Occupational History   Occupation: disabled    Associate Professor: UNEMPLOYED  Tobacco Use   Smoking status: Former    Current packs/day: 0.00    Types: Cigarettes    Quit date: 04/10/1988    Years since quitting: 35.2    Passive exposure: Past   Smokeless tobacco: Former   Tobacco comments:    Minimal prior use  Vaping Use   Vaping status: Never Used  Substance and Sexual Activity   Alcohol use: No   Drug use: No   Sexual activity: Not Currently    Partners: Male    Birth control/protection: Surgical    Comment: hyst  Other Topics Concern   Not on file  Social History Narrative   Divorced   Resides with daughter, also has 2 sons   No regular exercise   Social Drivers of Health   Financial Resource Strain: Low Risk  (12/26/2021)   Overall Financial Resource Strain (CARDIA)    Difficulty of Paying Living Expenses: Not hard at all  Food Insecurity: No Food Insecurity (12/26/2021)   Hunger Vital Sign    Worried About Running Out of Food in the Last Year: Never true    Ran Out of Food in the Last Year: Never true  Transportation Needs: No Transportation Needs (12/26/2021)   PRAPARE - Transportation  Lack of Transportation (Medical): No    Lack of Transportation (Non-Medical): No  Physical Activity: Inactive (12/26/2021)   Exercise Vital Sign    Days of Exercise per Week: 0 days    Minutes of Exercise per Session: 0 min  Stress: No Stress Concern Present (12/26/2021)   [employer] of Occupational Health - Occupational Stress Questionnaire    Feeling of Stress : Not at all  Social Connections: Moderately Isolated (12/26/2021)   Social Connection and Isolation Panel [NHANES]    Frequency of Communication with Friends and Family: More than three times a week    Frequency of Social Gatherings with Friends and Family: More than three times a week    Attends Religious Services: More than 4 times per year    Active Member of [employer] or  Organizations: No    Attends Banker Meetings: Never    Marital Status: Divorced   Outpatient Encounter Medications as of 06/25/2023  Medication Sig   ALPRAZolam (XANAX) 0.5 MG tablet TAKE 1 TABLET BY MOUTH TWICE DAILY AS NEEDED FOR ANXIETY   amLODipine (NORVASC) 5 MG tablet TAKE 1 TABLET BY MOUTH ONCE A DAY. (Patient taking differently: Take 2 tablets by mouth daily.)   bisacodyl (DULCOLAX) 5 MG EC tablet Take 5 mg by mouth as needed for moderate constipation.   Blood Glucose Monitoring Suppl (ACCU-CHEK GUIDE) w/Device KIT USE TO TEST BLOOD GLUCOSE TWICE DAILY AS DIRECTED.   Cholecalciferol (VITAMIN D-3) 125 MCG (5000 UT) TABS Take 5,000 Units by mouth in the morning.   clobetasol cream (TEMOVATE) 0.05 % APPLY TO AFFECTED AREA TWICE DAILY FOR 2 WEEKS; THEN 2 TO 3 TIMES PER WEEK.   colesevelam (WELCHOL) 625 MG tablet Take 1,875 mg by mouth 2 (two) times daily as needed (irritable bowel syndrome).   cyanocobalamin (VITAMIN B12) 1000 MCG tablet Take by mouth.   dicyclomine (BENTYL) 10 MG capsule Take 10 mg by mouth daily as needed (IBS).   Dulaglutide (TRULICITY) 3 MG/0.5ML SOAJ Inject 3 mg as directed once a week.   escitalopram (LEXAPRO) 20 MG tablet TAKE ONE TABLET BY MOUTH ONCE DAILY. (Patient taking differently: 10 mg.)   fluconazole (DIFLUCAN) 150 MG tablet TAKE 1 TABLET BY MOUTH NOW, AND 1 TABLET IN 3 DAYS. HOLD SIMVASTATIN WHILE TAKING   FLUoxetine (PROZAC) 20 MG capsule Take 20 mg by mouth daily.   glucose blood (ACCU-CHEK GUIDE TEST) test strip Use to check blood glucose twice daily as instructed   HYDROcodone-acetaminophen (NORCO) 10-325 MG tablet TAKE 1 TABLET BY MOUTH FOUR TIMES DAILY AS NEEDED.   lisinopril (ZESTRIL) 20 MG tablet Take 20 mg by mouth daily.   metFORMIN (GLUCOPHAGE) 1000 MG tablet TAKE ONE TABLET BY MOUTH TWICE A DAY WITH A MEAL   metoCLOPramide (REGLAN) 5 MG tablet Take 5 mg by mouth as needed for nausea.   Multiple Vitamin (MULTIVITAMIN WITH MINERALS)  TABS tablet Take 1 tablet by mouth daily. One A Day for Women   simvastatin (ZOCOR) 40 MG tablet Take 1 tablet (40 mg total) by mouth daily.   [DISCONTINUED] cephALEXin (KEFLEX) 500 MG capsule Take 1 capsule (500 mg total) by mouth 4 (four) times daily.   No facility-administered encounter medications on file as of 06/25/2023.    ALLERGIES: Allergies  Allergen Reactions   Peanut-Containing Drug Products Swelling    VACCINATION STATUS: Immunization History  Administered Date(s) Administered   Fluad Quad(high Dose 65+) 03/09/2020, 04/25/2021   PFIZER(Purple Top)SARS-COV-2 Vaccination 06/18/2019, 07/09/2019   PPD Test 12/17/2020  Pneumococcal Conjugate-13 03/09/2020    Diabetes She presents for her follow-up diabetic visit. She has type 2 diabetes mellitus. Onset time: She was diagnosed at approximate age of 41 years. Her disease course has been stable. There are no hypoglycemic associated symptoms. Pertinent negatives for hypoglycemia include no confusion, headaches, pallor or seizures. Pertinent negatives for diabetes include no blurred vision, no chest pain, no fatigue, no polydipsia, no polyphagia and no polyuria. There are no hypoglycemic complications. Symptoms are stable. Diabetic complications include retinopathy. Risk factors for coronary artery disease include diabetes mellitus, dyslipidemia, family history, hypertension, obesity, sedentary lifestyle, post-menopausal and tobacco exposure. Current diabetic treatments: She is currently on Invokana 300 mg p.o. daily metformin/glipizide,\ She is compliant with treatment some of the time. Her weight is fluctuating minimally (She has lost 35 pounds overall since last year.). She is following a generally unhealthy diet. When asked about meal planning, she reported none. She has not had a previous visit with a dietitian. She never participates in exercise. Her home blood glucose trend is decreasing steadily. Her breakfast blood glucose range is  generally 130-140 mg/dl. Her bedtime blood glucose range is generally 130-140 mg/dl. Her overall blood glucose range is 130-140 mg/dl. (She presents with her meter showing controlled glycemic profile averaging between 141-158 for the last 30 days.  Her point-of-care A1c is 6.1%, maintaining good control.  She did not document any hypoglycemia.     ) An ACE inhibitor/angiotensin II receptor blocker is being taken. Eye exam is current.  Hyperlipidemia This is a chronic problem. The current episode started more than 1 year ago. The problem is controlled. Exacerbating diseases include diabetes and obesity. Pertinent negatives include no chest pain, myalgias or shortness of breath. Current antihyperlipidemic treatment includes statins and ezetimibe. Risk factors for coronary artery disease include dyslipidemia, diabetes mellitus, family history, obesity, hypertension, a sedentary lifestyle and post-menopausal.  Hypertension This is a chronic problem. The current episode started more than 1 year ago. The problem is uncontrolled. Pertinent negatives include no blurred vision, chest pain, headaches, palpitations or shortness of breath. Risk factors for coronary artery disease include diabetes mellitus, dyslipidemia, family history, obesity, post-menopausal state, sedentary lifestyle and smoking/tobacco exposure. Past treatments include ACE inhibitors. Hypertensive end-organ damage includes retinopathy.    Review of systems Limited as above.  Objective:    BP 108/64   Pulse 76   Ht 5\' 9"  (1.753 m)   Wt 174 lb 6.4 oz (79.1 kg)   BMI 25.75 kg/m   Wt Readings from Last 3 Encounters:  06/25/23 174 lb 6.4 oz (79.1 kg)  01/30/23 173 lb 12.8 oz (78.8 kg)  11/30/22 177 lb (80.3 kg)      Physical Exam- Limited  CMP     Component Value Date/Time   NA 140 06/04/2023 1340   K 4.0 06/04/2023 1340   CL 98 06/04/2023 1340   CO2 27 06/04/2023 1340   GLUCOSE 116 (H) 06/04/2023 1340   GLUCOSE 281 (H)  05/15/2019 1505   BUN 17 06/04/2023 1340   CREATININE 0.54 (L) 06/04/2023 1340   CREATININE 0.64 08/14/2018 1142   CALCIUM 10.4 (H) 06/04/2023 1340   PROT 6.9 06/04/2023 1340   ALBUMIN 4.7 06/04/2023 1340   AST 23 06/04/2023 1340   ALT 21 06/04/2023 1340   ALKPHOS 105 06/04/2023 1340   BILITOT 0.4 06/04/2023 1340   GFRNONAA 99 03/09/2020 1400   GFRNONAA 92 08/14/2018 1142   GFRAA 114 03/09/2020 1400   GFRAA 107 08/14/2018 1142  Diabetic Labs (most recent): Lab Results  Component Value Date   HGBA1C 5.9 01/30/2023   HGBA1C 6.6 09/26/2022   HGBA1C 9.6 (A) 05/31/2022     Lipid Panel ( most recent) Lipid Panel     Component Value Date/Time   CHOL 129 06/04/2023 1340   TRIG 83 06/04/2023 1340   HDL 63 06/04/2023 1340   CHOLHDL 2.0 06/04/2023 1340   LDLCALC 50 06/04/2023 1340     Assessment & Plan:   1. Uncontrolled type 2 diabetes mellitus with hyperglycemia (HCC)  - Melissa Tucker has currently uncontrolled symptomatic type 2 DM since 73 years of age.  She presents with her meter showing controlled glycemic profile averaging between 141-158 for the last 30 days.  Her point-of-care A1c is 6.1%, maintaining good control.  She did not document any hypoglycemia.    -her diabetes is complicated by retinopathy, obesity/sedentary life, history of smoking and Melissa Tucker remains at a high risk for more acute and chronic complications which include CAD, CVA, CKD, retinopathy, and neuropathy. These are all discussed in detail with the patient.  - I have counseled her on diet management and weight loss, by adopting a carbohydrate restricted/protein rich diet.  - she acknowledges that there is a room for improvement in her food and drink choices. - Suggestion is made for her to avoid simple carbohydrates  from her diet including Cakes, Sweet Desserts, Ice Cream, Soda (diet and regular), Sweet Tea, Candies, Chips, Cookies, Store Bought Juices, Alcohol , Artificial Sweeteners,   Coffee Creamer, and "Sugar-free" Products, Lemonade. This will help patient to have more stable blood glucose profile and potentially avoid unintended weight gain.  The following Lifestyle Medicine recommendations according to American College of Lifestyle Medicine  [facility]) were discussed and and offered to patient and she  agrees to start the journey:  A. Whole Foods, Plant-Based Nutrition comprising of fruits and vegetables, plant-based proteins, whole-grain carbohydrates was discussed in detail with the patient.   A list for source of those nutrients were also provided to the patient.  Patient will use only water or unsweetened tea for hydration. B.  The need to stay away from risky substances including alcohol, smoking; obtaining 7 to 9 hours of restorative sleep, at least 150 minutes of moderate intensity exercise weekly, the importance of healthy social connections,  and stress management techniques were discussed. C.  A full color page of  Calorie density of various food groups per pound showing examples of each food groups was provided to the patient.  - I encouraged her to switch to  unprocessed or minimally processed complex starch and increased protein intake (animal or plant source), fruits, and vegetables.  - she is advised to stick to a routine mealtimes to eat 3 meals  a day and avoid unnecessary snacks ( to snack only to correct hypoglycemia).   - I have approached her with the following individualized plan to manage diabetes and patient agrees:   -In light of her presentation with near target control of glycemia, she will not need insulin treatment for now.  She has maintained 35 pounds of weight loss over the year.    -She is tolerating her current dose of Trulicity, 3 mg subcutaneously weekly.    -She will also continue to benefit from metformin 1000 mg p.o. twice daily.  Side effects and precautions discussed with her.   -She is advised to continue monitoring blood glucose twice  a day-daily before breakfast and at bedtime.  -  She is encouraged to call clinic for blood glucose readings less than 70 or greater than 200x3.     2) BP/HTN:  Her blood pressure is controlled to target.  She is advised to continue her current medications including lisinopril 40 mg p.o. daily, Lasix 40 mg p.o. daily . She will be considered for additional treatment with hydrochlorothiazide on her next visit.   3) Lipids/HPL: Her recent lipid panel showed LDL improved to 50.  She is advised to continue lovastatin 40 mg p.o. nightly.    Side effects and precautions discussed with her.    4)  Weight/Diet: Her BMI 25.75-she has achieved 35 pounds weight loss since last year.  This is considered adequate weight loss so far, not a candidate for any more major weight loss.    CDE Consult  has been  initiated , exercise, and detailed carbohydrates information provided.  5) hypercalcemia: She is not on calcium supplements.  This was observed on 2 separate occasions.  She will need workup with PTH/calcium, serum magnesium and phosphorus measurement along with her CMP next visit. She will continue to benefit from low-dose vitamin D.  6) Chronic Care/Health Maintenance:  -she  is on ACEI and Statin medications and  is encouraged to initiate and continue to follow up with Ophthalmology, Dentist,  Podiatrist at least yearly or according to recommendations, and advised to  stay away from smoking. I have recommended yearly flu vaccine and pneumonia vaccine at least every 5 years; moderate intensity exercise for up to 150 minutes weekly; and  sleep for at least 7 hours a day.  She did have normal screening ABI on May 11, 2020.  This study will be repeated in February 2027, or sooner if needed.  - I advised patient to maintain close follow up with Lupita Raider, NP for primary care needs.   I spent  26  minutes in the care of the patient today including review of labs from CMP, Lipids, Thyroid Function,  Hematology (current and previous including abstractions from other facilities); face-to-face time discussing  her blood glucose readings/logs, discussing hypoglycemia and hyperglycemia episodes and symptoms, medications doses, her options of short and long term treatment based on the latest standards of care / guidelines;  discussion about incorporating lifestyle medicine;  and documenting the encounter. Risk reduction counseling performed per USPSTF guidelines to reduce  cardiovascular risk factors.     Please refer to Patient Instructions for Blood Glucose Monitoring and Insulin/Medications Dosing Guide"  in media tab for additional information. Please  also refer to " Patient Self Inventory" in the Media  tab for reviewed elements of pertinent patient history.  Ricardo Jericho participated in the discussions, expressed understanding, and voiced agreement with the above plans.  All questions were answered to her satisfaction. she is encouraged to contact clinic should she have any questions or concerns prior to her return visit.   Follow up plan: - Return in about 6 months (around 12/26/2023) for F/U with Pre-visit Labs, A1c -NV.  Marquis Lunch, MD [facility] [address] [place], [place] [address] Phone: [phone]  Fax: [phone]    06/25/2023, 2:28 PM  This note was partially dictated with voice recognition software. Similar sounding words can be transcribed inadequately or may not  be corrected upon review.

## 2023-06-25 NOTE — Addendum Note (Signed)
 Addended by: Derrell Lolling on: 06/25/2023 03:11 PM   Modules accepted: Orders

## 2023-07-06 ENCOUNTER — Other Ambulatory Visit: Payer: Self-pay | Admitting: "Endocrinology

## 2023-08-06 DIAGNOSIS — G894 Chronic pain syndrome: Secondary | ICD-10-CM | POA: Diagnosis not present

## 2023-09-07 ENCOUNTER — Other Ambulatory Visit: Payer: Self-pay | Admitting: "Endocrinology

## 2023-09-07 DIAGNOSIS — E1165 Type 2 diabetes mellitus with hyperglycemia: Secondary | ICD-10-CM

## 2023-10-22 DIAGNOSIS — E785 Hyperlipidemia, unspecified: Secondary | ICD-10-CM | POA: Diagnosis not present

## 2023-10-22 DIAGNOSIS — E1165 Type 2 diabetes mellitus with hyperglycemia: Secondary | ICD-10-CM | POA: Diagnosis not present

## 2023-10-22 DIAGNOSIS — D649 Anemia, unspecified: Secondary | ICD-10-CM | POA: Diagnosis not present

## 2023-10-26 ENCOUNTER — Other Ambulatory Visit (HOSPITAL_COMMUNITY): Payer: Self-pay | Admitting: Family Medicine

## 2023-10-26 DIAGNOSIS — R112 Nausea with vomiting, unspecified: Secondary | ICD-10-CM | POA: Diagnosis not present

## 2023-10-26 DIAGNOSIS — R809 Proteinuria, unspecified: Secondary | ICD-10-CM | POA: Diagnosis not present

## 2023-10-26 DIAGNOSIS — E785 Hyperlipidemia, unspecified: Secondary | ICD-10-CM | POA: Diagnosis not present

## 2023-10-26 DIAGNOSIS — Z13820 Encounter for screening for osteoporosis: Secondary | ICD-10-CM

## 2023-10-26 DIAGNOSIS — G894 Chronic pain syndrome: Secondary | ICD-10-CM | POA: Diagnosis not present

## 2023-10-26 DIAGNOSIS — G4700 Insomnia, unspecified: Secondary | ICD-10-CM | POA: Diagnosis not present

## 2023-10-26 DIAGNOSIS — I700 Atherosclerosis of aorta: Secondary | ICD-10-CM | POA: Diagnosis not present

## 2023-10-26 DIAGNOSIS — E1165 Type 2 diabetes mellitus with hyperglycemia: Secondary | ICD-10-CM | POA: Diagnosis not present

## 2023-10-26 DIAGNOSIS — D649 Anemia, unspecified: Secondary | ICD-10-CM | POA: Diagnosis not present

## 2023-10-26 DIAGNOSIS — Z1231 Encounter for screening mammogram for malignant neoplasm of breast: Secondary | ICD-10-CM

## 2023-10-26 DIAGNOSIS — I10 Essential (primary) hypertension: Secondary | ICD-10-CM | POA: Diagnosis not present

## 2023-12-07 ENCOUNTER — Other Ambulatory Visit: Payer: Self-pay | Admitting: "Endocrinology

## 2023-12-07 DIAGNOSIS — E1165 Type 2 diabetes mellitus with hyperglycemia: Secondary | ICD-10-CM

## 2023-12-21 ENCOUNTER — Other Ambulatory Visit: Payer: Self-pay | Admitting: Adult Health

## 2023-12-21 LAB — MAGNESIUM: Magnesium: 1.5 mg/dL — ABNORMAL LOW (ref 1.6–2.3)

## 2023-12-21 LAB — COMPREHENSIVE METABOLIC PANEL WITH GFR
ALT: 23 IU/L (ref 0–32)
AST: 26 IU/L (ref 0–40)
Albumin: 4.7 g/dL (ref 3.8–4.8)
Alkaline Phosphatase: 120 IU/L (ref 44–121)
BUN/Creatinine Ratio: 19 (ref 12–28)
BUN: 13 mg/dL (ref 8–27)
Bilirubin Total: 0.5 mg/dL (ref 0.0–1.2)
CO2: 24 mmol/L (ref 20–29)
Calcium: 10.6 mg/dL — ABNORMAL HIGH (ref 8.7–10.3)
Chloride: 100 mmol/L (ref 96–106)
Creatinine, Ser: 0.68 mg/dL (ref 0.57–1.00)
Globulin, Total: 2.3 g/dL (ref 1.5–4.5)
Glucose: 166 mg/dL — ABNORMAL HIGH (ref 70–99)
Potassium: 4.6 mmol/L (ref 3.5–5.2)
Sodium: 141 mmol/L (ref 134–144)
Total Protein: 7 g/dL (ref 6.0–8.5)
eGFR: 92 mL/min/1.73 (ref >59)

## 2023-12-21 LAB — PTH, INTACT AND CALCIUM: PTH: 20 pg/mL (ref 15–65)

## 2023-12-21 LAB — PHOSPHORUS: Phosphorus: 4.2 mg/dL (ref 3.0–4.3)

## 2023-12-26 ENCOUNTER — Encounter: Payer: Self-pay | Admitting: "Endocrinology

## 2023-12-26 ENCOUNTER — Ambulatory Visit (INDEPENDENT_AMBULATORY_CARE_PROVIDER_SITE_OTHER): Admitting: "Endocrinology

## 2023-12-26 VITALS — BP 114/62 | HR 92 | Ht 69.0 in | Wt 174.6 lb

## 2023-12-26 DIAGNOSIS — I10 Essential (primary) hypertension: Secondary | ICD-10-CM

## 2023-12-26 DIAGNOSIS — E782 Mixed hyperlipidemia: Secondary | ICD-10-CM | POA: Diagnosis not present

## 2023-12-26 DIAGNOSIS — E1165 Type 2 diabetes mellitus with hyperglycemia: Secondary | ICD-10-CM | POA: Diagnosis not present

## 2023-12-26 DIAGNOSIS — Z7985 Long-term (current) use of injectable non-insulin antidiabetic drugs: Secondary | ICD-10-CM | POA: Diagnosis not present

## 2023-12-26 LAB — POCT GLYCOSYLATED HEMOGLOBIN (HGB A1C): HbA1c, POC (controlled diabetic range): 6.5 % (ref 0.0–7.0)

## 2023-12-26 MED ORDER — MAGNESIUM OXIDE 250 MG PO TABS: 250.0000 mg | ORAL_TABLET | Freq: Every day | ORAL | 1 refills | Status: AC

## 2023-12-26 MED ORDER — TRULICITY 4.5 MG/0.5ML ~~LOC~~ SOAJ: 4.5000 mg | SUBCUTANEOUS | 1 refills | Status: AC

## 2023-12-26 NOTE — Patient Instructions (Signed)

## 2023-12-26 NOTE — Progress Notes (Signed)
 12/26/2023, 5:43 PM        Endocrinology follow-up note   Subjective:    Patient ID: Melissa Tucker, female    DOB: [birth year].  Melissa Tucker is seen in follow-up for management of currently controlled asymptomatic diabetes, hyperlipidemia, hypertension requested by  Hyacinth Honey, NP.   Past Medical History:  Diagnosis Date   Anxiety and depression    Chest tightness    DEEP VENOUS THROMBOPHLEBITIS 10/05/2009   Qualifier: History of  By: Juventino, MD, CODY Lamar HERO    Diabetes mellitus 1994   No insulin    DJD (degenerative joint disease)    History of DVT (deep vein thrombosis) 1989   Following childbirth   Hyperlipidemia    Hypertension    IBS (irritable bowel syndrome)    Rotator cuff syndrome of right shoulder 08/22/2011   Past Surgical History:  Procedure Laterality Date   ABDOMINAL HYSTERECTOMY     CESAREAN SECTION  1988   CHOLECYSTECTOMY     COLONOSCOPY  2005   Normal findings   COLONOSCOPY WITH PROPOFOL  N/A 11/03/2020   Procedure: COLONOSCOPY WITH PROPOFOL ;  Surgeon: Eartha Angelia Sieving, MD;  Location: [facility];  Service: Gastroenterology;  Laterality: N/A;  8:30   JOINT REPLACEMENT N/A    Phreesia 02/28/2020   ORIF ANKLE FRACTURE Right 05/16/2019   Procedure: OPEN REDUCTION INTERNAL FIXATION (ORIF) ANKLE FRACTURE;  Surgeon: Kendal Franky SQUIBB, MD;  Location: [facility];  Service: Orthopedics;  Laterality: Right;   ORIF FEMUR FRACTURE Right 05/16/2019   Procedure: OPEN REDUCTION INTERNAL FIXATION (ORIF) DISTAL FEMUR FRACTURE;  Surgeon: Kendal Franky SQUIBB, MD;  Location: [facility];  Service: Orthopedics;  Laterality: Right;   SHOULDER SURGERY     Left shoulder manipulation with subcrominal decompression, capsulitis   TOTAL KNEE ARTHROPLASTY  2003   Left   TOTAL KNEE ARTHROPLASTY  2007   Right   Social History   Socioeconomic History   Marital status: Divorced    Spouse name: Not on file   Number of children: 1   Years of education:  35   Highest education level: 12th grade  Occupational History   Occupation: disabled    Associate Professor: UNEMPLOYED  Tobacco Use   Smoking status: Former    Current packs/day: 0.00    Types: Cigarettes    Quit date: 04/10/1988    Years since quitting: 35.7    Passive exposure: Past   Smokeless tobacco: Former   Tobacco comments:    Minimal prior use  Vaping Use   Vaping status: Never Used  Substance and Sexual Activity   Alcohol use: No   Drug use: No   Sexual activity: Not Currently    Partners: Male    Birth control/protection: Surgical    Comment: hyst  Other Topics Concern   Not on file  Social History Narrative   Divorced   Resides with daughter, also has 2 sons   No regular exercise   Social Drivers of Health   Financial Resource Strain: Low Risk  (12/26/2021)   Overall Financial Resource Strain (CARDIA)    Difficulty of Paying Living Expenses: Not hard at all  Food Insecurity: No Food Insecurity (12/26/2021)   Hunger Vital Sign    Worried About Running Out of Food in the Last Year: Never true    Ran Out of Food in the Last Year: Never true  Transportation Needs: No Transportation Needs (12/26/2021)   PRAPARE - Transportation  Lack of Transportation (Medical): No    Lack of Transportation (Non-Medical): No  Physical Activity: Inactive (12/26/2021)   Exercise Vital Sign    Days of Exercise per Week: 0 days    Minutes of Exercise per Session: 0 min  Stress: No Stress Concern Present (12/26/2021)   [employer] of Occupational Health - Occupational Stress Questionnaire    Feeling of Stress : Not at all  Social Connections: Moderately Isolated (12/26/2021)   Social Connection and Isolation Panel    Frequency of Communication with Friends and Family: More than three times a week    Frequency of Social Gatherings with Friends and Family: More than three times a week    Attends Religious Services: More than 4 times per year    Active Member of [employer] or Organizations:  No    Attends Banker Meetings: Never    Marital Status: Divorced   Outpatient Encounter Medications as of 12/26/2023  Medication Sig   Dulaglutide  (TRULICITY ) 4.5 MG/0.5ML SOAJ Inject 4.5 mg as directed once a week.   Magnesium  Oxide 250 MG TABS Take 1 tablet (250 mg total) by mouth daily with lunch.   ALPRAZolam  (XANAX ) 0.5 MG tablet TAKE 1 TABLET BY MOUTH TWICE DAILY AS NEEDED FOR ANXIETY   amLODipine  (NORVASC ) 5 MG tablet TAKE 1 TABLET BY MOUTH ONCE A DAY. (Patient taking differently: Take 2 tablets by mouth daily.)   bisacodyl  (DULCOLAX) 5 MG EC tablet Take 5 mg by mouth as needed for moderate constipation.   Blood Glucose Monitoring Suppl (ACCU-CHEK GUIDE) w/Device KIT USE TO TEST BLOOD GLUCOSE TWICE DAILY AS DIRECTED.   Cholecalciferol (VITAMIN D-3) 125 MCG (5000 UT) TABS Take 5,000 Units by mouth in the morning.   clobetasol  cream (TEMOVATE ) 0.05 % APPLY TO AFFECTED AREA TWICE DAILY FOR 2 WEEKS; THEN 2 TO 3 TIMES PER WEEK.   colesevelam (WELCHOL) 625 MG tablet Take 1,875 mg by mouth 2 (two) times daily as needed (irritable bowel syndrome).   cyanocobalamin (VITAMIN B12) 1000 MCG tablet Take by mouth.   dicyclomine (BENTYL) 10 MG capsule Take 10 mg by mouth daily as needed (IBS).   escitalopram (LEXAPRO) 20 MG tablet TAKE ONE TABLET BY MOUTH ONCE DAILY. (Patient taking differently: 10 mg.)   fluconazole  (DIFLUCAN ) 150 MG tablet TAKE 1 TABLET BY MOUTH NOW, AND 1 TABLET IN 3 DAYS. HOLD SIMVASTATIN  WHILE TAKING   FLUoxetine (PROZAC) 20 MG capsule Take 20 mg by mouth daily.   glucose blood (ACCU-CHEK GUIDE TEST) test strip Use to check blood glucose twice daily as instructed   HYDROcodone -acetaminophen  (NORCO) 10-325 MG tablet TAKE 1 TABLET BY MOUTH FOUR TIMES DAILY AS NEEDED.   lisinopril  (ZESTRIL ) 20 MG tablet Take 20 mg by mouth daily.   metFORMIN  (GLUCOPHAGE ) 1000 MG tablet TAKE ONE TABLET BY MOUTH TWICE A DAY WITH A MEAL   metoCLOPramide (REGLAN) 5 MG tablet Take 5 mg by  mouth as needed for nausea.   Multiple Vitamin (MULTIVITAMIN WITH MINERALS) TABS tablet Take 1 tablet by mouth daily. One A Day for Women   simvastatin  (ZOCOR ) 40 MG tablet Take 1 tablet (40 mg total) by mouth daily.   [DISCONTINUED] TRULICITY  3 MG/0.5ML SOAJ Inject 3 mg as directed once a week.   No facility-administered encounter medications on file as of 12/26/2023.    ALLERGIES: Allergies  Allergen Reactions   Peanut-Containing Drug Products Swelling    VACCINATION STATUS: Immunization History  Administered Date(s) Administered   Fluad Quad(high Dose 65+) 03/09/2020, 04/25/2021  PFIZER(Purple Top)SARS-COV-2 Vaccination 06/18/2019, 07/09/2019   PPD Test 12/17/2020   Pneumococcal Conjugate-13 03/09/2020    Diabetes She presents for her follow-up diabetic visit. She has type 2 diabetes mellitus. Onset time: She was diagnosed at approximate age of 41 years. Her disease course has been fluctuating. There are no hypoglycemic associated symptoms. Pertinent negatives for hypoglycemia include no confusion, headaches, pallor or seizures. Pertinent negatives for diabetes include no blurred vision, no chest pain, no fatigue, no polydipsia, no polyphagia and no polyuria. There are no hypoglycemic complications. Symptoms are stable. Diabetic complications include retinopathy. Risk factors for coronary artery disease include diabetes mellitus, dyslipidemia, family history, hypertension, obesity, sedentary lifestyle, post-menopausal and tobacco exposure. Current diabetic treatments: She is currently on Invokana 300 mg p.o. daily metformin /glipizide ,\ She is compliant with treatment some of the time. Her weight is fluctuating minimally (She has lost 35 pounds overall since last year.). She is following a generally unhealthy diet. When asked about meal planning, she reported none. She has not had a previous visit with a dietitian. She never participates in exercise. Her home blood glucose trend is  fluctuating minimally. Her breakfast blood glucose range is generally 130-140 mg/dl. Her bedtime blood glucose range is generally 130-140 mg/dl. Her overall blood glucose range is 130-140 mg/dl. (She presents with controlled glycemic provide averaging between 120 and 100 45 mg.  Her point-of-care A1c is 6.5% today.  She did not document any hypoglycemia.  She continues to tolerate Trulicity  and metformin .     ) An ACE inhibitor/angiotensin II receptor blocker is being taken. Eye exam is current.  Hyperlipidemia This is a chronic problem. The current episode started more than 1 year ago. The problem is controlled. Exacerbating diseases include diabetes and obesity. Pertinent negatives include no chest pain, myalgias or shortness of breath. Current antihyperlipidemic treatment includes statins and ezetimibe. Risk factors for coronary artery disease include dyslipidemia, diabetes mellitus, family history, obesity, hypertension, a sedentary lifestyle and post-menopausal.  Hypertension This is a chronic problem. The current episode started more than 1 year ago. The problem is uncontrolled. Pertinent negatives include no blurred vision, chest pain, headaches, palpitations or shortness of breath. Risk factors for coronary artery disease include diabetes mellitus, dyslipidemia, family history, obesity, post-menopausal state, sedentary lifestyle and smoking/tobacco exposure. Past treatments include ACE inhibitors. Hypertensive end-organ damage includes retinopathy.    Review of systems Limited as above.  Objective:    BP 114/62   Pulse 92   Ht 5' 9 (1.753 m)   Wt 174 lb 9.6 oz (79.2 kg)   BMI 25.78 kg/m   Wt Readings from Last 3 Encounters:  12/26/23 174 lb 9.6 oz (79.2 kg)  06/25/23 174 lb 6.4 oz (79.1 kg)  01/30/23 173 lb 12.8 oz (78.8 kg)      Physical Exam- Limited  CMP     Component Value Date/Time   NA 141 12/19/2023 1019   K 4.6 12/19/2023 1019   CL 100 12/19/2023 1019   CO2 24  12/19/2023 1019   GLUCOSE 166 (H) 12/19/2023 1019   GLUCOSE 281 (H) 05/15/2019 1505   BUN 13 12/19/2023 1019   CREATININE 0.68 12/19/2023 1019   CREATININE 0.64 08/14/2018 1142   CALCIUM 10.6 (H) 12/19/2023 1019   PROT 7.0 12/19/2023 1019   ALBUMIN 4.7 12/19/2023 1019   AST 26 12/19/2023 1019   ALT 23 12/19/2023 1019   ALKPHOS 120 12/19/2023 1019   BILITOT 0.5 12/19/2023 1019   GFRNONAA 99 03/09/2020 1400   GFRNONAA 92 08/14/2018  1142   GFRAA 114 03/09/2020 1400   GFRAA 107 08/14/2018 1142     Diabetic Labs (most recent): Lab Results  Component Value Date   HGBA1C 6.5 12/26/2023   HGBA1C 6.1 06/25/2023   HGBA1C 5.9 01/30/2023     Lipid Panel ( most recent) Lipid Panel     Component Value Date/Time   CHOL 129 06/04/2023 1340   TRIG 83 06/04/2023 1340   HDL 63 06/04/2023 1340   CHOLHDL 2.0 06/04/2023 1340   LDLCALC 50 06/04/2023 1340     Assessment & Plan:   1. Uncontrolled type 2 diabetes mellitus with hyperglycemia (HCC)  - Melissa Tucker has currently uncontrolled symptomatic type 2 DM since 73 years of age.  She presents with controlled glycemic provide averaging between 120 and 100 45 mg.  Her point-of-care A1c is 6.5% today.  She did not document any hypoglycemia.  She continues to tolerate Trulicity  and metformin .    -her diabetes is complicated by retinopathy, obesity/sedentary life, history of smoking and Melissa Tucker remains at a high risk for more acute and chronic complications which include CAD, CVA, CKD, retinopathy, and neuropathy. These are all discussed in detail with the patient.  - I have counseled her on diet management and weight loss, by adopting a carbohydrate restricted/protein rich diet.  - she acknowledges that there is a room for improvement in her food and drink choices. - Suggestion is made for her to avoid simple carbohydrates  from her diet including Cakes, Sweet Desserts, Ice Cream, Soda (diet and regular), Sweet Tea, Candies, Chips,  Cookies, Store Bought Juices, Alcohol , Artificial Sweeteners,  Coffee Creamer, and Sugar-free Products, Lemonade. This will help patient to have more stable blood glucose profile and potentially avoid unintended weight gain.  The following Lifestyle Medicine recommendations according to American College of Lifestyle Medicine  [facility]) were discussed and and offered to patient and she  agrees to start the journey:  A. Whole Foods, Plant-Based Nutrition comprising of fruits and vegetables, plant-based proteins, whole-grain carbohydrates was discussed in detail with the patient.   A list for source of those nutrients were also provided to the patient.  Patient will use only water  or unsweetened tea for hydration. B.  The need to stay away from risky substances including alcohol, smoking; obtaining 7 to 9 hours of restorative sleep, at least 150 minutes of moderate intensity exercise weekly, the importance of healthy social connections,  and stress management techniques were discussed. C.  A full color page of  Calorie density of various food groups per pound showing examples of each food groups was provided to the patient.  - I encouraged her to switch to  unprocessed or minimally processed complex starch and increased protein intake (animal or plant source), fruits, and vegetables.  - she is advised to stick to a routine mealtimes to eat 3 meals  a day and avoid unnecessary snacks ( to snack only to correct hypoglycemia).   - I have approached her with the following individualized plan to manage diabetes and patient agrees:   -In light of her presentation with near target glycemic profile, she would not need insulin  treatment for now.  She did well with benefit from a higher dose of Trulicity  for more weight loss advantage.  She agrees with my recommendation to increase Trulicity  to 4.5 mg subcutaneously weekly.  Side effects and precautions discussed with her.   -She will also continue to benefit from  metformin  1000 mg p.o. twice  daily.   -She is advised to continue monitoring blood glucose twice a day-daily before breakfast and at bedtime.  -She is encouraged to call clinic for blood glucose readings less than 70 or greater than 200x3.     2) BP/HTN:  Her blood pressure is controlled to target.  She is advised to continue her current medications including lisinopril  40 mg p.o. daily, Lasix 40 mg p.o. daily . She will be considered for additional treatment with hydrochlorothiazide on her next visit.   3) Lipids/HPL: Her recent lipid panel showed LDL improved to 50.  She is advised to continue lovastatin 40 mg p.o. nightly.  Side effects and precaution discussed with her.    4)  Weight/Diet: Her BMI 25.78--she has achieved 35 pounds weight loss over the last couple of years.  This is considered adequate weight loss so far, not a candidate for any more major weight loss.    CDE Consult  has been  initiated , exercise, and detailed carbohydrates information provided.  5) hypercalcemia: She is not on calcium supplements.  This was observed on 2 separate occasions, PTH independent.  She was found to have hypomagnesemia.  She will be supported with low-dose magnesium  to optimize her magnesium  levels. She will have repeat labs next visit. She will continue to benefit from low-dose vitamin D.  6) Chronic Care/Health Maintenance:  -she  is on ACEI and Statin medications and  is encouraged to initiate and continue to follow up with Ophthalmology, Dentist,  Podiatrist at least yearly or according to recommendations, and advised to  stay away from smoking. I have recommended yearly flu vaccine and pneumonia vaccine at least every 5 years; moderate intensity exercise for up to 150 minutes weekly; and  sleep for at least 7 hours a day.  She did have normal screening ABI on May 11, 2020.   - I advised patient to maintain close follow up with Hyacinth Honey, NP for primary care needs.  I spent  26   minutes in the care of the patient today including review of labs from CMP, Lipids, Thyroid  Function, Hematology (current and previous including abstractions from other facilities); face-to-face time discussing  her blood glucose readings/logs, discussing hypoglycemia and hyperglycemia episodes and symptoms, medications doses, her options of short and long term treatment based on the latest standards of care / guidelines;  discussion about incorporating lifestyle medicine;  and documenting the encounter. Risk reduction counseling performed per USPSTF guidelines to reduce cardiovascular risk factors.     Please refer to Patient Instructions for Blood Glucose Monitoring and Insulin /Medications Dosing Guide  in media tab for additional information. Please  also refer to  Patient Self Inventory in the Media  tab for reviewed elements of pertinent patient history.  Melissa Tucker participated in the discussions, expressed understanding, and voiced agreement with the above plans.  All questions were answered to her satisfaction. she is encouraged to contact clinic should she have any questions or concerns prior to her return visit.   Follow up plan: - Return in about 4 months (around 04/26/2024) for F/U with Pre-visit Labs, Meter/CGM/Logs, A1c here.  Ranny Earl, MD [facility] [address] [place], [place] [address] Phone: [phone]  Fax: [phone]    12/26/2023, 5:43 PM  This note was partially dictated with voice recognition software. Similar sounding words can be transcribed inadequately or may not  be corrected upon review.

## 2023-12-31 ENCOUNTER — Other Ambulatory Visit (HOSPITAL_COMMUNITY)

## 2023-12-31 ENCOUNTER — Inpatient Hospital Stay (HOSPITAL_COMMUNITY): Admission: RE | Admit: 2023-12-31 | Source: Ambulatory Visit

## 2024-01-16 ENCOUNTER — Ambulatory Visit (HOSPITAL_COMMUNITY)

## 2024-01-16 ENCOUNTER — Other Ambulatory Visit (HOSPITAL_COMMUNITY)

## 2024-01-25 ENCOUNTER — Other Ambulatory Visit (HOSPITAL_COMMUNITY)

## 2024-01-25 ENCOUNTER — Ambulatory Visit (HOSPITAL_COMMUNITY)

## 2024-01-28 DIAGNOSIS — Z79899 Other long term (current) drug therapy: Secondary | ICD-10-CM | POA: Diagnosis not present

## 2024-01-28 DIAGNOSIS — G894 Chronic pain syndrome: Secondary | ICD-10-CM | POA: Diagnosis not present

## 2024-01-28 DIAGNOSIS — E1165 Type 2 diabetes mellitus with hyperglycemia: Secondary | ICD-10-CM | POA: Diagnosis not present

## 2024-01-28 DIAGNOSIS — Z7985 Long-term (current) use of injectable non-insulin antidiabetic drugs: Secondary | ICD-10-CM | POA: Diagnosis not present

## 2024-01-28 DIAGNOSIS — E785 Hyperlipidemia, unspecified: Secondary | ICD-10-CM | POA: Diagnosis not present

## 2024-01-28 DIAGNOSIS — Z0000 Encounter for general adult medical examination without abnormal findings: Secondary | ICD-10-CM | POA: Diagnosis not present

## 2024-01-31 ENCOUNTER — Ambulatory Visit (HOSPITAL_COMMUNITY)
Admission: RE | Admit: 2024-01-31 | Discharge: 2024-01-31 | Disposition: A | Discharge status: Home / Self Care | Source: Ambulatory Visit | Attending: Family Medicine | Admitting: Family Medicine

## 2024-01-31 ENCOUNTER — Encounter (HOSPITAL_COMMUNITY): Payer: Self-pay

## 2024-01-31 DIAGNOSIS — Z1231 Encounter for screening mammogram for malignant neoplasm of breast: Secondary | ICD-10-CM | POA: Diagnosis not present

## 2024-01-31 DIAGNOSIS — Z780 Asymptomatic menopausal state: Secondary | ICD-10-CM | POA: Diagnosis not present

## 2024-01-31 DIAGNOSIS — Z13820 Encounter for screening for osteoporosis: Secondary | ICD-10-CM | POA: Insufficient documentation

## 2024-01-31 DIAGNOSIS — M8589 Other specified disorders of bone density and structure, multiple sites: Secondary | ICD-10-CM | POA: Diagnosis not present

## 2024-01-31 DIAGNOSIS — M8588 Other specified disorders of bone density and structure, other site: Secondary | ICD-10-CM | POA: Insufficient documentation

## 2024-02-11 ENCOUNTER — Other Ambulatory Visit: Payer: Self-pay | Admitting: Adult Health

## 2024-02-29 ENCOUNTER — Other Ambulatory Visit: Payer: Self-pay | Admitting: Adult Health

## 2024-03-06 ENCOUNTER — Other Ambulatory Visit: Payer: Self-pay | Admitting: "Endocrinology

## 2024-03-06 DIAGNOSIS — E1165 Type 2 diabetes mellitus with hyperglycemia: Secondary | ICD-10-CM

## 2024-03-12 ENCOUNTER — Other Ambulatory Visit: Payer: Self-pay | Admitting: "Endocrinology

## 2024-03-12 DIAGNOSIS — E1165 Type 2 diabetes mellitus with hyperglycemia: Secondary | ICD-10-CM

## 2024-04-29 ENCOUNTER — Other Ambulatory Visit: Payer: Self-pay | Admitting: *Deleted

## 2024-04-29 ENCOUNTER — Telehealth: Payer: Self-pay | Admitting: "Endocrinology

## 2024-04-29 DIAGNOSIS — E6609 Other obesity due to excess calories: Secondary | ICD-10-CM

## 2024-04-29 DIAGNOSIS — E782 Mixed hyperlipidemia: Secondary | ICD-10-CM

## 2024-04-29 DIAGNOSIS — E1165 Type 2 diabetes mellitus with hyperglycemia: Secondary | ICD-10-CM

## 2024-04-29 DIAGNOSIS — Z7985 Long-term (current) use of injectable non-insulin antidiabetic drugs: Secondary | ICD-10-CM

## 2024-04-29 DIAGNOSIS — I10 Essential (primary) hypertension: Secondary | ICD-10-CM

## 2024-04-29 NOTE — Telephone Encounter (Signed)
 Pt needs labs updated

## 2024-04-29 NOTE — Telephone Encounter (Signed)
 Lab updated.

## 2024-05-06 ENCOUNTER — Ambulatory Visit: Admitting: "Endocrinology

## 2024-06-17 ENCOUNTER — Ambulatory Visit: Admitting: "Endocrinology
